# Patient Record
Sex: Male | Born: 1990 | Race: White | Hispanic: No | Marital: Married | Smoking: Never smoker
Health system: Southern US, Community
[De-identification: ages and names within clinical notes are randomized; demographics above are authoritative.]

## PROBLEM LIST (undated history)

## (undated) DIAGNOSIS — I429 Cardiomyopathy, unspecified: Secondary | ICD-10-CM

## (undated) HISTORY — PX: HERNIA REPAIR: SHX51

---

## 2020-10-22 ENCOUNTER — Other Ambulatory Visit: Payer: Self-pay

## 2020-10-22 ENCOUNTER — Emergency Department (HOSPITAL_BASED_OUTPATIENT_CLINIC_OR_DEPARTMENT_OTHER): Payer: Self-pay

## 2020-10-22 ENCOUNTER — Inpatient Hospital Stay (HOSPITAL_BASED_OUTPATIENT_CLINIC_OR_DEPARTMENT_OTHER)
Admission: EM | Admit: 2020-10-22 | Discharge: 2020-10-27 | DRG: 193 | Disposition: A | Discharge status: Home / Self Care | Payer: Self-pay | Attending: Student | Admitting: Student

## 2020-10-22 ENCOUNTER — Encounter (HOSPITAL_BASED_OUTPATIENT_CLINIC_OR_DEPARTMENT_OTHER): Payer: Self-pay | Admitting: Emergency Medicine

## 2020-10-22 DIAGNOSIS — I509 Heart failure, unspecified: Secondary | ICD-10-CM

## 2020-10-22 DIAGNOSIS — I428 Other cardiomyopathies: Secondary | ICD-10-CM | POA: Diagnosis present

## 2020-10-22 DIAGNOSIS — Z6841 Body Mass Index (BMI) 40.0 and over, adult: Secondary | ICD-10-CM

## 2020-10-22 DIAGNOSIS — R008 Other abnormalities of heart beat: Secondary | ICD-10-CM | POA: Diagnosis present

## 2020-10-22 DIAGNOSIS — J189 Pneumonia, unspecified organism: Principal | ICD-10-CM | POA: Diagnosis present

## 2020-10-22 DIAGNOSIS — A419 Sepsis, unspecified organism: Secondary | ICD-10-CM | POA: Diagnosis present

## 2020-10-22 DIAGNOSIS — D72829 Elevated white blood cell count, unspecified: Secondary | ICD-10-CM | POA: Diagnosis present

## 2020-10-22 DIAGNOSIS — I5041 Acute combined systolic (congestive) and diastolic (congestive) heart failure: Secondary | ICD-10-CM | POA: Diagnosis present

## 2020-10-22 DIAGNOSIS — J9690 Respiratory failure, unspecified, unspecified whether with hypoxia or hypercapnia: Secondary | ICD-10-CM

## 2020-10-22 DIAGNOSIS — I498 Other specified cardiac arrhythmias: Secondary | ICD-10-CM

## 2020-10-22 DIAGNOSIS — I081 Rheumatic disorders of both mitral and tricuspid valves: Secondary | ICD-10-CM | POA: Diagnosis present

## 2020-10-22 DIAGNOSIS — R0602 Shortness of breath: Secondary | ICD-10-CM | POA: Diagnosis present

## 2020-10-22 DIAGNOSIS — T500X5A Adverse effect of mineralocorticoids and their antagonists, initial encounter: Secondary | ICD-10-CM | POA: Diagnosis present

## 2020-10-22 DIAGNOSIS — Z79899 Other long term (current) drug therapy: Secondary | ICD-10-CM

## 2020-10-22 DIAGNOSIS — Z888 Allergy status to other drugs, medicaments and biological substances status: Secondary | ICD-10-CM

## 2020-10-22 DIAGNOSIS — Z20822 Contact with and (suspected) exposure to covid-19: Secondary | ICD-10-CM | POA: Diagnosis present

## 2020-10-22 DIAGNOSIS — Z8701 Personal history of pneumonia (recurrent): Secondary | ICD-10-CM

## 2020-10-22 DIAGNOSIS — R0683 Snoring: Secondary | ICD-10-CM | POA: Diagnosis present

## 2020-10-22 DIAGNOSIS — I5021 Acute systolic (congestive) heart failure: Secondary | ICD-10-CM | POA: Diagnosis present

## 2020-10-22 DIAGNOSIS — I5022 Chronic systolic (congestive) heart failure: Secondary | ICD-10-CM | POA: Diagnosis present

## 2020-10-22 DIAGNOSIS — R778 Other specified abnormalities of plasma proteins: Secondary | ICD-10-CM | POA: Diagnosis present

## 2020-10-22 DIAGNOSIS — Z8249 Family history of ischemic heart disease and other diseases of the circulatory system: Secondary | ICD-10-CM

## 2020-10-22 DIAGNOSIS — E876 Hypokalemia: Secondary | ICD-10-CM | POA: Diagnosis present

## 2020-10-22 DIAGNOSIS — Z87891 Personal history of nicotine dependence: Secondary | ICD-10-CM

## 2020-10-22 DIAGNOSIS — I110 Hypertensive heart disease with heart failure: Secondary | ICD-10-CM | POA: Diagnosis present

## 2020-10-22 DIAGNOSIS — I493 Ventricular premature depolarization: Secondary | ICD-10-CM | POA: Diagnosis present

## 2020-10-22 DIAGNOSIS — F419 Anxiety disorder, unspecified: Secondary | ICD-10-CM | POA: Diagnosis present

## 2020-10-22 DIAGNOSIS — E871 Hypo-osmolality and hyponatremia: Secondary | ICD-10-CM | POA: Diagnosis present

## 2020-10-22 HISTORY — DX: Morbid (severe) obesity due to excess calories: E66.01

## 2020-10-22 LAB — BASIC METABOLIC PANEL
Anion gap: 10 (ref 5–15)
BUN: 11 mg/dL (ref 6–20)
CO2: 20 mmol/L — ABNORMAL LOW (ref 22–32)
Calcium: 8 mg/dL — ABNORMAL LOW (ref 8.9–10.3)
Chloride: 105 mmol/L (ref 98–111)
Creatinine, Ser: 0.99 mg/dL (ref 0.61–1.24)
GFR, Estimated: >60 mL/min (ref >60)
Glucose, Bld: 96 mg/dL (ref 70–99)
Potassium: 4 mmol/L (ref 3.5–5.1)
Sodium: 135 mmol/L (ref 135–145)

## 2020-10-22 LAB — CBC WITH DIFFERENTIAL/PLATELET
Abs Immature Granulocytes: 0.06 10*3/uL (ref 0.00–0.07)
Basophils Absolute: 0 10*3/uL (ref 0.0–0.1)
Basophils Relative: 0 %
Eosinophils Absolute: 0.1 10*3/uL (ref 0.0–0.5)
Eosinophils Relative: 0 %
HCT: 42.2 % (ref 39.0–52.0)
Hemoglobin: 13.8 g/dL (ref 13.0–17.0)
Immature Granulocytes: 0 %
Lymphocytes Relative: 7 %
Lymphs Abs: 1 10*3/uL (ref 0.7–4.0)
MCH: 27.3 pg (ref 26.0–34.0)
MCHC: 32.7 g/dL (ref 30.0–36.0)
MCV: 83.4 fL (ref 80.0–100.0)
Monocytes Absolute: 1.1 10*3/uL — ABNORMAL HIGH (ref 0.1–1.0)
Monocytes Relative: 7 %
Neutro Abs: 12.8 10*3/uL — ABNORMAL HIGH (ref 1.7–7.7)
Neutrophils Relative %: 86 %
Platelets: 312 10*3/uL (ref 150–400)
RBC: 5.06 MIL/uL (ref 4.22–5.81)
RDW: 13.5 % (ref 11.5–15.5)
WBC: 15.1 10*3/uL — ABNORMAL HIGH (ref 4.0–10.5)
nRBC: 0 % (ref 0.0–0.2)

## 2020-10-22 LAB — TROPONIN I (HIGH SENSITIVITY)
Troponin I (High Sensitivity): 47 ng/L — ABNORMAL HIGH (ref <18)
Troponin I (High Sensitivity): 46 ng/L — ABNORMAL HIGH (ref <18)

## 2020-10-22 LAB — RESP PANEL BY RT-PCR (FLU A&B, COVID) ARPGX2
Influenza A by PCR: NEGATIVE
Influenza B by PCR: NEGATIVE
SARS Coronavirus 2 by RT PCR: NEGATIVE

## 2020-10-22 LAB — BRAIN NATRIURETIC PEPTIDE: B Natriuretic Peptide: 183.1 pg/mL — ABNORMAL HIGH (ref 0.0–100.0)

## 2020-10-22 LAB — D-DIMER, QUANTITATIVE: D-Dimer, Quant: 0.54 ug/mL-FEU — ABNORMAL HIGH (ref 0.00–0.50)

## 2020-10-22 MED ORDER — SODIUM CHLORIDE 0.9 % IV SOLN
2.0000 g | Freq: Once | INTRAVENOUS | Status: AC
  Administered 2020-10-23: 2 g via INTRAVENOUS
  Filled 2020-10-22: qty 2

## 2020-10-22 MED ORDER — VANCOMYCIN HCL 1750 MG/350ML IV SOLN
1750.0000 mg | Freq: Three times a day (TID) | INTRAVENOUS | Status: DC
  Administered 2020-10-23: 1750 mg via INTRAVENOUS
  Filled 2020-10-22 (×2): qty 350

## 2020-10-22 MED ORDER — ACETAMINOPHEN 650 MG RE SUPP: 650.0000 mg | Freq: Four times a day (QID) | RECTAL | Status: DC | PRN

## 2020-10-22 MED ORDER — ENOXAPARIN SODIUM 40 MG/0.4ML IJ SOSY
40.0000 mg | PREFILLED_SYRINGE | INTRAMUSCULAR | Status: DC
  Administered 2020-10-23: 40 mg via SUBCUTANEOUS
  Filled 2020-10-22: qty 0.4

## 2020-10-22 MED ORDER — DOXYCYCLINE HYCLATE 100 MG PO TABS
100.0000 mg | ORAL_TABLET | Freq: Once | ORAL | Status: AC
  Administered 2020-10-22: 100 mg via ORAL
  Filled 2020-10-22: qty 1

## 2020-10-22 MED ORDER — IOHEXOL 350 MG/ML SOLN
100.0000 mL | Freq: Once | INTRAVENOUS | Status: AC | PRN
  Administered 2020-10-22: 100 mL via INTRAVENOUS

## 2020-10-22 MED ORDER — ACETAMINOPHEN 325 MG PO TABS: 650.0000 mg | ORAL_TABLET | Freq: Four times a day (QID) | ORAL | Status: DC | PRN

## 2020-10-22 MED ORDER — SODIUM CHLORIDE 0.9 % IV SOLN
2.0000 g | Freq: Three times a day (TID) | INTRAVENOUS | Status: DC
  Administered 2020-10-23 – 2020-10-24 (×4): 2 g via INTRAVENOUS
  Filled 2020-10-22 (×4): qty 2

## 2020-10-22 MED ORDER — VANCOMYCIN HCL 2000 MG/400ML IV SOLN
2000.0000 mg | Freq: Once | INTRAVENOUS | Status: AC
  Administered 2020-10-23: 2000 mg via INTRAVENOUS
  Filled 2020-10-22: qty 400

## 2020-10-22 MED ORDER — SODIUM CHLORIDE 0.9 % IV SOLN
2.0000 g | Freq: Once | INTRAVENOUS | Status: AC
  Administered 2020-10-22: 2 g via INTRAVENOUS
  Filled 2020-10-22: qty 20

## 2020-10-22 NOTE — H&P (Addendum)
History and Physical    PLEASE NOTE THAT DRAGON DICTATION SOFTWARE WAS USED IN THE CONSTRUCTION OF THIS NOTE.   David Vang DPO:[id number] DOB: 12-02-1990 DOA: 10/22/2020  PCP: Pcp, No Patient coming from: home   I have personally briefly reviewed patient's old medical records in [place]  Chief Complaint: Shortness of breath  HPI: David Vang is a 30 y.o. male with medical history significant for morbid obesity, recurrent pneumonia, who is admitted to [facility] on 10/22/2020 by way of transfer from [place] with suspected sepsis due to bilateral pneumonia after presenting from home to the latter facility complaining of shortness of breath.  The patient reports approximately 2 weeks of shortness of breath associated with new onset productive cough as well as subjective fever in the absence of chills, full body rigors, or generalized myalgias.  He denies any associated peripheral edema, and reports a stable weight over the last month, specifically noting no recent weight gain after weighing himself every several days over the course of that timeframe.  Denies any associated calf tenderness, lower extremity erythema, hemoptysis.  Reports mild orthopnea, and increased cough when attempting to lay supine.  Denies any PND.  Not associate with any chest pain, diaphoresis, nausea, vomiting, presyncope, or syncope.  He reports mild intermittent wheezing over the last few weeks, but denies any associated or preceding rhinitis, rhinorrhea, or periorbital swelling.  No known history of asthma.  Denies any recent trauma or travel, including no travel into the [place] portion [place] the [place].  Overall, denies any known history of underlying chronic pulmonary pathology, and reports that he is a lifelong non-smoker.  He reports that he has been diagnosed with pneumonia on 4 separate occasions leading up to this current presentation.  The first 2 diagnoses of pneumonia were  when he was in elementary school, with the third episode reported to be in high school, and most recent prior pneumonia diagnosis is reported to have occurred 5 to 6 years ago.  Not on any chronic immunosuppression.  No recent neck stiffness, sore throat, diarrhea, or rash.  No recent known COVID-19 exposures.  He also denies any recent dysuria, gross hematuria, or change in urinary urgency/frequency.  Denies routine or recent alcohol consumption or recreational drug use, including no use of stimulants, namely cocaine or methamphetamine.  Following onset of the above symptoms, he was diagnosed with community-acquired pneumonia as an outpatient approximately 2 weeks ago, at which time you completed a 5-day course of azithromycin as well as a 6-day course of prednisone following which he reported no significant improvement in his respiratory status.  Subsequent to that, he was provided with an albuterol inhaler, which also offered no benefit in his shortness of breath.  Rather, the patient reported subsequent development of palpitations following every 4 to 5 times per day use of this albuterol inhaler.  In the setting of persistence of the above symptoms, he was prescribed a 5-day course of Levaquin, and is now completed for 5 days of this antibiotic.  However, in the setting of progressive worsening of his shortness of breath in spite of good compliance with the above interventions, he elected to present to [place] emergency department earlier today for further evaluation and management of the above.  He denies any known history of prior ventricular bigeminy.  He conveys a family history that is notable for his father being diagnosed with cardiomyopathy in his late 30s/early 66s ultimately requiring an LVAD.  Ongoing follow-up  otherwise, he denies any known injury, cardiac pathology within his family.      [facility] ED Course:  Vital signs in the ED were notable for the following: Normal:  Tetramex 99.6, heart rate 98-1 20; blood pressure 108/73-124/89; respiratory rate 24-30; oxygen saturation 95 to 96% on room air.  Labs were notable for the following: BMP was notable for sodium 135, potassium 4.0, bicarbonate 20, anion gap 10, creatinine 0.99.  CBC notable for white blood cell count of 15,000, hemoglobin 13.8.  D-dimer 0.54.  BNP 183, without prior data point available for point comparison.  Initial high-sensitivity troponin I found to be 47, with repeat trending down to 46.  Urinalysis showed no evidence of white blood cells no bacteria, was nitrate negative and leukocyte Estrace negative.  Nasopharyngeal COVID-19/influenza PCR were performed at [place] today, and found to be negative.  EKG showed ventricular bigeminy with all narrow complex QRS, ventricular rate 144, without overt T wave changes or ST changes.  Chest x-ray showed mild cardiomegaly with mild vascular congestion, as well as bilateral mid to lower lung field airspace opacities, right greater than left, suggestive of pneumonia versus edema, in the absence of any evidence of pleural effusion or pneumothorax.  CTA chest showed no acute pulmonary embolism, but showed bilateral airspace opacities, right greater than left suggestive of multifocal pneumonia versus asymmetric edema.  While in the ED, the following were administered: Rocephin 2 g IV x1, doxycycline 100 mg p.o. x1.     Review of Systems: As per HPI otherwise 10 point review of systems negative.   Past Medical History:  Diagnosis Date  . Morbid obesity ([place])     History reviewed. No pertinent surgical history.  Social History:  reports that he has never smoked. He has never used smokeless tobacco. No history on file for alcohol use and drug use.   Allergies  Allergen Reactions  . Benadryl [Diphenhydramine] Other (See Comments)    Pass out    Family History  Problem Relation Age of Onset  . Heart failure Father        dx'ed with  cardiomyoopathy in his late 62's/40's, and ultimately required LVAD     Prior to Admission medications   Not on File  (denies any scheduled or prn prescription or over-the-counter medications on an outpatient basis).    Objective    Physical Exam: Vitals:   10/22/20 1701 10/22/20 1800 10/22/20 1912 10/22/20 2211  BP: 108/73 123/90 124/89 110/85  Pulse: 64 67 65 63  Resp: (!) 30 18 (!) 30 (!) 24  Temp:   98.3 F (36.8 C) 99.3 F (37.4 C)  TempSrc:   Oral Oral  SpO2: 95% 95% 96% 96%  Weight:      Height:        General: appears to be stated age; alert, oriented; mildly increased wob noted. Skin: warm, dry, no rash Head:  AT/[place] Mouth:  Oral mucosa membranes appear moist, normal dentition Neck: supple; trachea midline Heart:  RRR; did not appreciate any M/R/G Lungs: CTAB, did not appreciate any wheezes, rales, or rhonchi Abdomen: + BS; soft, ND, NT Vascular: 2+ pedal pulses b/l; 2+ radial pulses b/l Extremities: no peripheral edema, no muscle wasting Neuro: strength and sensation intact in upper and lower extremities b/l     Labs on Admission: I have personally reviewed following labs and imaging studies  CBC: Recent Labs  Lab 10/22/20 1625  WBC 15.1*  NEUTROABS 12.8*  HGB 13.8  HCT 42.2  MCV 83.4  PLT 675   Basic Metabolic Panel: Recent Labs  Lab 10/22/20 1625  NA 135  K 4.0  CL 105  CO2 20*  GLUCOSE 96  BUN 11  CREATININE 0.99  CALCIUM 8.0*   GFR: Estimated Creatinine Clearance: 138.1 mL/min (by C-G formula based on SCr of 0.99 mg/dL). Liver Function Tests: No results for input(s): AST, ALT, ALKPHOS, BILITOT, PROT, ALBUMIN in the last 168 hours. No results for input(s): LIPASE, AMYLASE in the last 168 hours. No results for input(s): AMMONIA in the last 168 hours. Coagulation Profile: No results for input(s): INR, PROTIME in the last 168 hours. Cardiac Enzymes: No results for input(s): CKTOTAL, CKMB, CKMBINDEX, TROPONINI in the last 168  hours. BNP (last 3 results) No results for input(s): PROBNP in the last 8760 hours. HbA1C: No results for input(s): HGBA1C in the last 72 hours. CBG: No results for input(s): GLUCAP in the last 168 hours. Lipid Profile: No results for input(s): CHOL, HDL, LDLCALC, TRIG, CHOLHDL, LDLDIRECT in the last 72 hours. Thyroid Function Tests: No results for input(s): TSH, T4TOTAL, FREET4, T3FREE, THYROIDAB in the last 72 hours. Anemia Panel: No results for input(s): VITAMINB12, FOLATE, FERRITIN, TIBC, IRON, RETICCTPCT in the last 72 hours. Urine analysis: No results found for: COLORURINE, APPEARANCEUR, LABSPEC, PHURINE, GLUCOSEU, HGBUR, BILIRUBINUR, KETONESUR, PROTEINUR, UROBILINOGEN, NITRITE, LEUKOCYTESUR  Radiological Exams on Admission: CT Angio Chest PE W and/or Wo Contrast  Result Date: 10/22/2020 CLINICAL DATA:  Worsening shortness of breath over the last 2 weeks. Diagnosed with pneumonia 2 weeks ago. Patient has been on antibiotics without relief. EXAM: CT ANGIOGRAPHY CHEST WITH CONTRAST TECHNIQUE: Multidetector CT imaging of the chest was performed using the standard protocol during bolus administration of intravenous contrast. Multiplanar CT image reconstructions and MIPs were obtained to evaluate the vascular anatomy. CONTRAST:  196m OMNIPAQUE IOHEXOL 350 MG/ML SOLN COMPARISON:  Current chest radiograph. FINDINGS: Cardiovascular: Pulmonary arteries are well opacified. There is no evidence of a pulmonary embolism. Heart is borderline to mildly enlarged. No pericardial effusion. Great vessels are normal in caliber. Mediastinum/Nodes: Visualized thyroid is unremarkable. There are shotty prominent to mildly enlarged mediastinal and right hilar lymph nodes. A superior right paratracheal lymph node measures 1.4 cm in short axis. A probable conglomeration of right paratracheal lymph nodes just above the azygos arch measured 2 cm in short axis. An enlarged prevascular node measures 1.1 cm short axis. A  right superior hilar node measures 1.6 cm in short axis. Trachea and esophagus are unremarkable. Lungs/Pleura: Trace right pleural effusion. Bilateral airspace opacities which predominate on the right, noted in all lobes, with similar appearing consolidation in the left lower lobe, sparing the left upper lobe. No interstitial thickening. No pneumothorax. Upper Abdomen: Decreased attenuation of the liver consistent with fatty infiltration. Otherwise unremarkable. Musculoskeletal: No chest wall abnormality. No acute or significant osseous findings. Review of the MIP images confirms the above findings. IMPRESSION: 1. No evidence of a pulmonary embolism. 2. Extensive airspace lung opacities, more evident on the right, as detailed above. Patient has borderline cardiomegaly and a trace right pleural effusion. Findings may reflect asymmetric pulmonary edema or multifocal pneumonia. Electronically Signed   By: DLajean ManesM.D.   On: 10/22/2020 17:48   DG Chest Portable 1 View  Result Date: 10/22/2020 CLINICAL DATA:  30year old male with shortness of breath. Evaluate for pneumonia. EXAM: PORTABLE CHEST 1 VIEW COMPARISON:  None. FINDINGS: There is mild cardiomegaly with mild vascular congestion. Bilateral mid to lower lung field densities, right greater  than left may represent edema or pneumonia. No large pleural effusion. No pneumothorax. No acute osseous pathology. IMPRESSION: 1. Cardiomegaly with mild vascular congestion. 2. Bilateral mid to lower lung field densities, right greater than left may represent edema or pneumonia. Electronically Signed   By: Anner Crete M.D.   On: 10/22/2020 16:07     EKG: Independently reviewed, with result as described above.    Assessment/Plan   David Vang is a 30 y.o. male with medical history significant for morbid obesity, recurrent pneumonia, who is admitted to [facility] on 10/22/2020 by way of transfer from [place] with suspected sepsis  due to bilateral pneumonia after presenting from home to the latter facility complaining of shortness of breath.   Principal Problem:   CAP (community acquired pneumonia) Active Problems:   Sepsis ([place])   Leukocytosis   SOB (shortness of breath)   Ventricular bigeminy   Elevated troponin    #) Sepsis due to bilateral pneumonia: All specific etiology leading to the patient's presenting shortness of breath is likely multifactorial in its etiology, there is a suspected contribution from bilateral pneumonia given the patient's report of 2 weeks of progressive shortness of breath associated with subjective fever, new onset productive cough, and radiographic evidence of bilateral airspace opacities, right greater than left, suggestive of multifocal pneumonia versus asymmetrical edema.  SIRS criteria met via presenting leukocytosis and tachycardia.  Patient sepsis does not meet criteria to be considered severe nature in the absence of associated evidence of endorgan damage, although lactate was not checked at [facility] prior to transfer.  No evidence of associated hypotension.  At this time, there are no criteria for initiation of a 30 mL/kg IVF bolus, particularly given that there may be a component of atypical heart failure, as further discussed below.  As the above symptoms progressed in spite of compliance with outpatient regimens of both azithromycin and Levaquin, will expand current antibiotic coverage such that we will start IV vancomycin for MRSA coverage as well as cefepime for inclusion of pseudomonal coverage.  We will refrain from including atypical pneumonia coverage at this time, as he has not received 2 rounds of antibiotics that should have provided adequate atypical coverage in his completed courses of azithromycin and Levaquin.  No clinical suspicion for aspiration at this time.  We will also check MRSA PCR, which if negative, anticipate being able to discontinue IV vancomycin given the  high negative predictive value of such finding for MRSA pneumonia.  Of note, COVID-19/influenza PCR performed earlier today were found to be negative.   While there is clinical and radiographic suggestion of bilateral pneumonia, differential for the patient's presentation as well as radiographic appearance includes asymmetrical edema secondary to congestive heart failure in the setting of no prior diagnosis of such.  Of note, patient noted to be in ventricular bigeminy, which would increase risk for Cardiomyopathy as a consequence of high PVC burden.  We will further evaluate ventricular bigeminy as a potential cause versus a sequela I of underlying heart failure, as further described below, including via pursuit of echocardiogram in the morning.  Patient's family history of cardiomyopathy diagnosed in his father in his late 37s, progressing to ultimately requiring LVAD is noted, and warrants further evaluation via echocardiogram given this history as well as the nature of the patient's presentation.  Additionally, in attempting to further discern between bilateral pneumonia versus heart failure with asymmetrical edema, I have ordered Lasix 20 mg IV x1 and will assess for ensuing  urine output as well as symptomatic response to this measure.   Plan: Check blood cultures x2 now.  Check lactic acid level.  IV vancomycin and cefepime, as above.  Check MRSA PCR.  Sputum culture.  Repeat CBC with differential in the morning.  Check mycoplasma antibodies as well as strep pneumonia urine antigen.  Procalcitonin.  Further evaluation management of ventricular bigeminy, as detailed below.  Echocardiogram.  Check serum magnesium level as well as serum phos.  Monitor strict I's and O's and daily weights.  Lasix 20 mg IV x1, as above.      #) Ventricular bigeminy: In the absence of any prior documentation of such, and with the patient denying any known history of this, he is found to be in ventricular bigeminy while at  [facility] ED earlier today and associated with ventricular rate in the 120s to 130s.  Overall, duration of this is unclear in terms of its chronicity.  In the setting of the patient's body habitus with morbid obesity, he is certainly at risk for development of LVH, increasing his risk for ventricular bigeminy.  We will further assess for structural cardiac abnormalities that may be contributory to this presentation via echocardiogram has been ordered for the morning.  Exacerbation of underlying ventricular bigeminy may have also been contributed by multiple recent pharmacologic factors, including recent use of albuterol inhaler, up to 4-5 times per day over the last week in addition to steroid use. Overall, at this time, it is unclear if patient's ventricular bigeminy is as a consequence of his recent acute illness, versus a contributing factor leading to new onset heart failure.  Given the unclear primary pulm pathology, including the possibility of underlying acutely decompensated heart failure, will refrain from beta-blocker initiation for now, pending echocardiogram in the morning.  We will also attempt to optimize electrolyte management in terms of serum potassium and magnesium levels, with goal to maintain these values of greater than or equal to 4.0 and 2.0, respectively.  Patient's presenting serum potassium found to be within this goal range, while serum magnesium of 1.8 will warrant IV magnesium supplementation, as further detailed below.  will also check TSH.   Plan: Monitor on telemetry.  Monitor continuous pulse oximetry.  Echocardiogram in the morning, as above.  Refrain from beta-1 agonist use, as above.  Magnesium sulfate 2 g IV over 2 hours x 1 dose now.  Recheck serum magnesium level in the morning.  Repeat BMP in the morning, with close attention to interval trend in serum potassium level.  TSH. UDS.      #) Elevated troponin: mildly elevated initial troponin of 47, with repeat value  trending down to 46. No prior high sensitivity troponin I value available for point of comparison.  Suspect that this mildly elevated troponin is on the basis of supply demand mismatch in the setting of ventricular bigeminy with high PVC volume and associated relative decline in diastolic filling time vs supply demand mismatch in the setting of sepsis due to bilateral pneumonia, as opposed to representing a type I process due to acute plaque rupture.  As described above, EKG shows evidence of ventricular bigeminy without overt evidence of T wave or ST changes.  Additionally, presentation is not been associate with any chest pain.  Overall, ACS is felt to be less likely relative to type 2 supply demand mismatch, as above, will closely monitor on telemetry while further evaluating presenting with ventricular bigeminy, including pursuit of echocardiogram, as further described above.  Of  note, CTA performed earlier today demonstrated no evidence of acute pulmonary embolism.    Plan:Monitor on telemetry.  Repeat BMP and serum magnesium levels in the morning.  Repeat CBC in the morning as well.  Further evaluation and management of presenting ventricular bigeminy, as above, including echocardiogram.       #) Morbid obesity: Presenting BMI noted to be 42, with the patient denying any weight gain over the last 1 to 2 months.  This body habitus is a predisposing factor for LVH associated ventricular bigeminy.   Plan: Check TSH.  Monitor strict I's and O's and daily weights.     DVT prophylaxis: Lovenox 40 mg subcu daily Code Status: Full code Family Communication: none Disposition Plan: Per Rounding Team Consults called: none  Admission status: Inpatient; med telemetry.     Of note, this patient was added by me to the following Admit List/Treatment Team: [email or web address].      PLEASE NOTE THAT DRAGON DICTATION SOFTWARE WAS USED IN THE CONSTRUCTION OF THIS NOTE.   [place] Pager [phone] From [place]  Otherwise, please contact night-coverage  [email or web address] Password [facility]   10/22/2020, 10:37 PM

## 2020-10-22 NOTE — ED Triage Notes (Signed)
Pt arrives to ED with c/o of worsening shortness of breath over the past two weeks. Pt states he was diagnosed with bilateral pneumonia x2 weeks ago at his PCP office and has been on two rounds of antibiotics without relief of symptoms. Pt has finished his azithromycin, prednisone, and levofloxacin with worsening of his symptoms. Albuterol inhaler has given little relief.

## 2020-10-22 NOTE — ED Notes (Signed)
Report given to [facility] at this time.   

## 2020-10-22 NOTE — Progress Notes (Signed)
Pharmacy Antibiotic Note  David Vang is a 30 y.o. male admitted on 10/22/2020 with pneumonia.  Pharmacy has been consulted for vancomycin and cefepime dosing.  Plan: Cefepime 2gm IV q8 hours Vancomycin 2gm IV x 1 then 1750 mg IV q8 F/u renal function, cultures and clinical course  Height: 5\' 7"  (170.2 cm) Weight: 122.5 kg (270 lb) IBW/kg (Calculated) : 66.1  Temp (24hrs), Avg:99.1 F (37.3 C), Min:98.3 F (36.8 C), Max:99.6 F (37.6 C)  Recent Labs  Lab 10/22/20 1625  WBC 15.1*  CREATININE 0.99    Estimated Creatinine Clearance: 138.1 mL/min (by C-G formula based on SCr of 0.99 mg/dL).    Allergies  Allergen Reactions  . Benadryl [Diphenhydramine] Other (See Comments)    Pass out    Thank you for allowing pharmacy to be a part of this patient's care.  10/24/20 [place] 10/22/2020 11:01 PM

## 2020-10-22 NOTE — ED Provider Notes (Addendum)
[facility] EMERGENCY DEPT Provider Note   CSN: [id number] Arrival date & time: 10/22/20  1527     History Chief Complaint  Patient presents with  . Shortness of Breath    David Vang is a 30 y.o. male.  Presenting to ER with concern for shortness of breath.  Patient reports that for the past 3 weeks he has been dealing with cough and shortness of breath.  Initially primarily coughing, with more mild shortness of breath.  Had gone to an urgent care and was diagnosed with pneumonia.  He completed a course of Z-Pak, may have some initial improvement but not complete resolution.  Went back to urgent care and was given a prescription for levofloxacin.  States that he has completed this but still is not improved.  Over the past week or so has had significant increase in his difficulty breathing.  No associated chest pain, no blood in cough.  Some mucus mostly clear.  Also states that he was prescribed an inhaler and this may have had some mild temporary relief but no long-term improvement.  Does not have any known cardiac history but his father had some major heart problems though he is unsure what exactly the problems were.  HPI     History reviewed. No pertinent past medical history.  Patient Active Problem List   Diagnosis Date Noted  . CAP (community acquired pneumonia) 10/22/2020    History reviewed. No pertinent family history.  Social History   Tobacco Use  . Smoking status: Never Smoker  . Smokeless tobacco: Never Used    Home Medications Prior to Admission medications   Not on File    Allergies    Benadryl [diphenhydramine]  Review of Systems   Review of Systems  Constitutional: Positive for chills and fatigue. Negative for fever.  HENT: Negative for ear pain and sore throat.   Eyes: Negative for pain and visual disturbance.  Respiratory: Positive for cough and shortness of breath.   Cardiovascular: Negative for chest pain and palpitations.   Gastrointestinal: Negative for abdominal pain and vomiting.  Genitourinary: Negative for dysuria and hematuria.  Musculoskeletal: Negative for arthralgias and back pain.  Skin: Negative for color change and rash.  Neurological: Negative for seizures and syncope.  All other systems reviewed and are negative.   Physical Exam Updated Vital Signs BP 124/89 (BP Location: Right Arm)   Pulse 65   Temp 98.3 F (36.8 C) (Oral)   Resp (!) 30   Ht 5\' 7"  (1.702 m)   Wt 122.5 kg   SpO2 96%   BMI 42.29 kg/m   Physical Exam Vitals and nursing note reviewed.  Constitutional:      Appearance: He is well-developed.     Comments: Appears well but is visibly tachypneic,  HENT:     Head: Normocephalic and atraumatic.  Eyes:     Conjunctiva/sclera: Conjunctivae normal.  Cardiovascular:     Rate and Rhythm: Normal rate and regular rhythm.     Heart sounds: No murmur heard.   Pulmonary:     Comments: Moderately tachypneic, speaking in short phrases, lungs are mostly clear, somewhat diminished at bases Abdominal:     Palpations: Abdomen is soft.     Tenderness: There is no abdominal tenderness.  Musculoskeletal:     Cervical back: Neck supple.  Skin:    General: Skin is warm and dry.  Neurological:     General: No focal deficit present.     Mental Status: He is alert.  ED Results / Procedures / Treatments   Labs (all labs ordered are listed, but only abnormal results are displayed) Labs Reviewed  CBC WITH DIFFERENTIAL/PLATELET - Abnormal; Notable for the following components:      Result Value   WBC 15.1 (*)    Neutro Abs 12.8 (*)    Monocytes Absolute 1.1 (*)    All other components within normal limits  BASIC METABOLIC PANEL - Abnormal; Notable for the following components:   CO2 20 (*)    Calcium 8.0 (*)    All other components within normal limits  D-DIMER, QUANTITATIVE - Abnormal; Notable for the following components:   D-Dimer, Quant 0.54 (*)    All other  components within normal limits  BRAIN NATRIURETIC PEPTIDE - Abnormal; Notable for the following components:   B Natriuretic Peptide 183.1 (*)    All other components within normal limits  TROPONIN I (HIGH SENSITIVITY) - Abnormal; Notable for the following components:   Troponin I (High Sensitivity) 47 (*)    All other components within normal limits  TROPONIN I (HIGH SENSITIVITY) - Abnormal; Notable for the following components:   Troponin I (High Sensitivity) 46 (*)    All other components within normal limits  RESP PANEL BY RT-PCR (FLU A&B, COVID) [id number]    EKG EKG Interpretation  Date/Time:  Sunday Oct 22 2020 15:39:12 EDT Ventricular Rate:  141 PR Interval:  137 QRS Duration: 104 QT Interval:  299 QTC Calculation: 430 R Axis:   -24 Text Interpretation: Sinus tachycardia Ventricular bigeminy Borderline left axis deviation Anteroseptal infarct, old Borderline ST depression, diffuse leads Confirmed by Marianna Fuss ([id number]) on 10/22/2020 3:45:43 PM   Radiology CT Angio Chest PE W and/or Wo Contrast  Result Date: 10/22/2020 CLINICAL DATA:  Worsening shortness of breath over the last 2 weeks. Diagnosed with pneumonia 2 weeks ago. Patient has been on antibiotics without relief. EXAM: CT ANGIOGRAPHY CHEST WITH CONTRAST TECHNIQUE: Multidetector CT imaging of the chest was performed using the standard protocol during bolus administration of intravenous contrast. Multiplanar CT image reconstructions and MIPs were obtained to evaluate the vascular anatomy. CONTRAST:  OMNIPAQUE IOHEXOL 350 MG/ML SOLN COMPARISON:  Current chest radiograph. FINDINGS: Cardiovascular: Pulmonary arteries are well opacified. There is no evidence of a pulmonary embolism. Heart is borderline to mildly enlarged. No pericardial effusion. Great vessels are normal in caliber. Mediastinum/Nodes: Visualized thyroid is unremarkable. There are shotty prominent to mildly enlarged mediastinal and right hilar lymph nodes.  A superior right paratracheal lymph node measures 1.4 cm in short axis. A probable conglomeration of right paratracheal lymph nodes just above the azygos arch measured 2 cm in short axis. An enlarged prevascular node measures 1.1 cm short axis. A right superior hilar node measures 1.6 cm in short axis. Trachea and esophagus are unremarkable. Lungs/Pleura: Trace right pleural effusion. Bilateral airspace opacities which predominate on the right, noted in all lobes, with similar appearing consolidation in the left lower lobe, sparing the left upper lobe. No interstitial thickening. No pneumothorax. Upper Abdomen: Decreased attenuation of the liver consistent with fatty infiltration. Otherwise unremarkable. Musculoskeletal: No chest wall abnormality. No acute or significant osseous findings. Review of the MIP images confirms the above findings. IMPRESSION: 1. No evidence of a pulmonary embolism. 2. Extensive airspace lung opacities, more evident on the right, as detailed above. Patient has borderline cardiomegaly and a trace right pleural effusion. Findings may reflect asymmetric pulmonary edema or multifocal pneumonia. Electronically Signed   By: Renard Hamper.D.  On: 10/22/2020 17:48   DG Chest Portable 1 View  Result Date: 10/22/2020 CLINICAL DATA:  30 year old male with shortness of breath. Evaluate for pneumonia. EXAM: PORTABLE CHEST 1 VIEW COMPARISON:  None. FINDINGS: There is mild cardiomegaly with mild vascular congestion. Bilateral mid to lower lung field densities, right greater than left may represent edema or pneumonia. No large pleural effusion. No pneumothorax. No acute osseous pathology. IMPRESSION: 1. Cardiomegaly with mild vascular congestion. 2. Bilateral mid to lower lung field densities, right greater than left may represent edema or pneumonia. Electronically Signed   By: Elgie Collard M.D.   On: 10/22/2020 16:07    Procedures .Critical Care Performed by: Milagros Loll,  MD Authorized by: Milagros Loll, MD   Critical care provider statement:    Critical care time (minutes):  35   Critical care was necessary to treat or prevent imminent or life-threatening deterioration of the following conditions:  Respiratory failure   Critical care was time spent personally by me on the following activities:  Discussions with consultants, evaluation of patient's response to treatment, examination of patient, ordering and performing treatments and interventions, ordering and review of laboratory studies, ordering and review of radiographic studies, pulse oximetry, re-evaluation of patient's condition, obtaining history from patient or surrogate and review of old charts     Medications Ordered in ED Medications  cefTRIAXone (ROCEPHIN) 2 g in sodium chloride 0.9 % 100 mL IVPB (0 g Intravenous Stopped 10/22/20 1803)  doxycycline (VIBRA-TABS) tablet 100 mg (100 mg Oral Given 10/22/20 1721)  iohexol (OMNIPAQUE) 350 MG/ML injection 100 mL (100 mLs Intravenous Contrast Given 10/22/20 1709)    ED Course  I have reviewed the triage vital signs and the nursing notes.  Pertinent labs & imaging results that were available during my care of the patient were reviewed by me and considered in my medical decision making (see chart for details).    MDM Rules/Calculators/A&P                         30 year old male presenting to ER with concern for worsening shortness of breath.  Diagnosed in the outpatient setting with pneumonia and has completed course of azithromycin and levofloxacin without relief.  Chest x-ray concerning for pneumonia versus fluid overload.  Labs notable for leukocytosis, mild elevation in troponin and BNP as well as elevation in D-dimer.  EKG with bigeminy but no STEMI, given serial troponin flat, lower suspicion for ACS.  CTA negative for PE but again redemonstrated findings concerning for either pneumonia or fluid overload.  Due to cough and leukocytosis, suspect  most likely pneumonia.  Started IV antibiotics.  Given failed outpatient therapy, possibility of new onset heart failure, believe patient would benefit from IV antibiotics, echocardiogram and further observation in hospital.  [facility] hospitalist for admit.  Final Clinical Impression(s) / ED Diagnoses Final diagnoses:  Community acquired pneumonia, unspecified laterality  Heart failure, unspecified HF chronicity, unspecified heart failure type (HCC)  Respiratory failure, unspecified chronicity, unspecified whether with hypoxia or hypercapnia [facility])    Rx / DC Orders ED Discharge Orders    None       Milagros Loll, MD 10/22/20 2009    Milagros Loll, MD 10/22/20 2009

## 2020-10-22 NOTE — ED Notes (Signed)
Care Handoff/Patient Report given to Minerva Areola, [place]. All questions answered. Patient signed transfer consent form willingly.

## 2020-10-22 NOTE — Progress Notes (Signed)
[name], David Vang DOB 13-Oct-1990 MRN: [id number]  I discussed the following case with Dr. Stevie Kern (emergency department physician at [facility]), who contacted the call-center requesting transfer of the above patient to [facility] for further work-up and management of sepsis due to bilateral pneumonia.   David Vang is a 30 year old male with obesity but otherwise no reported medical history who [facility] dropp[facility] ED on 10/22/2020 complaining of 2 to 3 weeks of progressive shortness of breath associated with new onset nonproductive cough and subjective fever reportedly denies any associated orthopnea, PND, new onset peripheral edema, or recent weight gain.  Also reportedly denies any associated chest pain, palpitations, diaphoresis, nausea/vomiting. No hemoptysis, calf tenderness, or lower extremity erythema.  He reportedly was diagnosed with community-acquired pneumonia as an outpatient in the setting of the above symptoms, and is completed several courses of azithromycin and Levaquin over the last 2 weeks, without any ensuing improvement in the above symptoms.  No known history of underlying heart failure or chronic underlying pulmonary pathology.  Vital signs in the ED were notable for the following: Temperature 99.6, found to be in bigeminy, with heart rates in the low 100s, normotensive blood pressures, respiratory rate 25-30, O2 sats 95 to 96% on room air.   Labs were reportedly notable for: wbc of 15.  COVID-negative.  BNP 183, with no prior data point available for comparison.  Initial troponin I 47, with repeat trending down to 46. CTA chest which reportedly showed no evidence of acute pulmonary embolism, but demonstrated bilateral, asymmetric airspace opacities, reportedly favoring pneumonia however edema.  EKG reportedly showed bigeminy with heart rate in the low 100s and no evidence of T wave/ST changes.  Medications administered prior to transfer  included the following: Rocephin 2 g IV x1 and doxycycline 100 mg p.o. x1.  In the setting of progressive symptoms in spite of compliance with outpatient antibiotics, the patient has been accepted for transfer to a med-tele bed at [facility] for further work-up and management of suspected sepsis due to community-acquired pneumonia versus acute heart failure.   Newton Pigg, DO Hospitalist

## 2020-10-23 ENCOUNTER — Inpatient Hospital Stay (HOSPITAL_COMMUNITY): Payer: Self-pay

## 2020-10-23 ENCOUNTER — Encounter (HOSPITAL_COMMUNITY): Payer: Self-pay | Admitting: Internal Medicine

## 2020-10-23 DIAGNOSIS — R778 Other specified abnormalities of plasma proteins: Secondary | ICD-10-CM | POA: Diagnosis present

## 2020-10-23 DIAGNOSIS — R0602 Shortness of breath: Secondary | ICD-10-CM | POA: Diagnosis present

## 2020-10-23 DIAGNOSIS — I498 Other specified cardiac arrhythmias: Secondary | ICD-10-CM | POA: Diagnosis present

## 2020-10-23 DIAGNOSIS — A419 Sepsis, unspecified organism: Secondary | ICD-10-CM | POA: Diagnosis present

## 2020-10-23 DIAGNOSIS — I5031 Acute diastolic (congestive) heart failure: Secondary | ICD-10-CM

## 2020-10-23 DIAGNOSIS — D72829 Elevated white blood cell count, unspecified: Secondary | ICD-10-CM | POA: Diagnosis present

## 2020-10-23 DIAGNOSIS — R7989 Other specified abnormal findings of blood chemistry: Secondary | ICD-10-CM | POA: Diagnosis present

## 2020-10-23 LAB — COMPREHENSIVE METABOLIC PANEL
ALT: 55 U/L — ABNORMAL HIGH (ref 0–44)
AST: 30 U/L (ref 15–41)
Albumin: 2.9 g/dL — ABNORMAL LOW (ref 3.5–5.0)
Alkaline Phosphatase: 53 U/L (ref 38–126)
Anion gap: 9 (ref 5–15)
BUN: 10 mg/dL (ref 6–20)
CO2: 25 mmol/L (ref 22–32)
Calcium: 8.6 mg/dL — ABNORMAL LOW (ref 8.9–10.3)
Chloride: 102 mmol/L (ref 98–111)
Creatinine, Ser: 1.04 mg/dL (ref 0.61–1.24)
GFR, Estimated: >60 mL/min (ref >60)
Glucose, Bld: 123 mg/dL — ABNORMAL HIGH (ref 70–99)
Potassium: 3.9 mmol/L (ref 3.5–5.1)
Sodium: 136 mmol/L (ref 135–145)
Total Bilirubin: 1.2 mg/dL (ref 0.3–1.2)
Total Protein: 5.9 g/dL — ABNORMAL LOW (ref 6.5–8.1)

## 2020-10-23 LAB — URINALYSIS, COMPLETE (UACMP) WITH MICROSCOPIC
Bacteria, UA: NONE SEEN
Bilirubin Urine: NEGATIVE
Glucose, UA: NEGATIVE mg/dL
Hgb urine dipstick: NEGATIVE
Ketones, ur: 5 mg/dL — AB
Leukocytes,Ua: NEGATIVE
Nitrite: NEGATIVE
Protein, ur: NEGATIVE mg/dL
Specific Gravity, Urine: 1.013 (ref 1.005–1.030)
pH: 5 (ref 5.0–8.0)

## 2020-10-23 LAB — CBC WITH DIFFERENTIAL/PLATELET
Abs Immature Granulocytes: 0.06 10*3/uL (ref 0.00–0.07)
Basophils Absolute: 0.1 10*3/uL (ref 0.0–0.1)
Basophils Relative: 0 %
Eosinophils Absolute: 0 10*3/uL (ref 0.0–0.5)
Eosinophils Relative: 0 %
HCT: 41.2 % (ref 39.0–52.0)
Hemoglobin: 13.3 g/dL (ref 13.0–17.0)
Immature Granulocytes: 0 %
Lymphocytes Relative: 14 %
Lymphs Abs: 2.1 10*3/uL (ref 0.7–4.0)
MCH: 27.5 pg (ref 26.0–34.0)
MCHC: 32.3 g/dL (ref 30.0–36.0)
MCV: 85.3 fL (ref 80.0–100.0)
Monocytes Absolute: 1.3 10*3/uL — ABNORMAL HIGH (ref 0.1–1.0)
Monocytes Relative: 8 %
Neutro Abs: 11.7 10*3/uL — ABNORMAL HIGH (ref 1.7–7.7)
Neutrophils Relative %: 78 %
Platelets: 282 10*3/uL (ref 150–400)
RBC: 4.83 MIL/uL (ref 4.22–5.81)
RDW: 13.5 % (ref 11.5–15.5)
WBC: 15.2 10*3/uL — ABNORMAL HIGH (ref 4.0–10.5)
nRBC: 0 % (ref 0.0–0.2)

## 2020-10-23 LAB — ECHOCARDIOGRAM COMPLETE
AR max vel: 0.94 cm2
AV Area VTI: 0.93 cm2
AV Area mean vel: 0.95 cm2
AV Mean grad: 5 mmHg
AV Peak grad: 8.9 mmHg
Ao pk vel: 1.49 m/s
Area-P 1/2: 7.74 cm2
Height: 67 in
S' Lateral: 6.4 cm
Weight: 4320 oz

## 2020-10-23 LAB — MRSA PCR SCREENING: MRSA by PCR: NEGATIVE

## 2020-10-23 LAB — RAPID URINE DRUG SCREEN, HOSP PERFORMED
Amphetamines: NOT DETECTED
Barbiturates: NOT DETECTED
Benzodiazepines: NOT DETECTED
Cocaine: NOT DETECTED
Opiates: NOT DETECTED
Tetrahydrocannabinol: NOT DETECTED

## 2020-10-23 LAB — EXPECTORATED SPUTUM ASSESSMENT W GRAM STAIN, RFLX TO RESP C

## 2020-10-23 LAB — PHOSPHORUS: Phosphorus: 3 mg/dL (ref 2.5–4.6)

## 2020-10-23 LAB — LACTIC ACID, PLASMA: Lactic Acid, Venous: 1.4 mmol/L (ref 0.5–1.9)

## 2020-10-23 LAB — MAGNESIUM
Magnesium: 1.8 mg/dL (ref 1.7–2.4)
Magnesium: 1.9 mg/dL (ref 1.7–2.4)

## 2020-10-23 LAB — STREP PNEUMONIAE URINARY ANTIGEN: Strep Pneumo Urinary Antigen: NEGATIVE

## 2020-10-23 LAB — PROCALCITONIN: Procalcitonin: <0.1 ng/mL

## 2020-10-23 LAB — HIV ANTIBODY (ROUTINE TESTING W REFLEX): HIV Screen 4th Generation wRfx: NONREACTIVE

## 2020-10-23 LAB — TSH: TSH: 1.775 u[IU]/mL (ref 0.350–4.500)

## 2020-10-23 MED ORDER — FUROSEMIDE 10 MG/ML IJ SOLN
20.0000 mg | Freq: Once | INTRAMUSCULAR | Status: AC
  Administered 2020-10-23: 20 mg via INTRAVENOUS
  Filled 2020-10-23: qty 2

## 2020-10-23 MED ORDER — MAGNESIUM SULFATE 2 GM/50ML IV SOLN
2.0000 g | Freq: Once | INTRAVENOUS | Status: AC
  Administered 2020-10-23: 2 g via INTRAVENOUS
  Filled 2020-10-23: qty 50

## 2020-10-23 MED ORDER — DM-GUAIFENESIN ER 30-600 MG PO TB12
1.0000 | ORAL_TABLET | Freq: Two times a day (BID) | ORAL | Status: DC
  Administered 2020-10-23 – 2020-10-27 (×9): 1 via ORAL
  Filled 2020-10-23 (×9): qty 1

## 2020-10-23 MED ORDER — PERFLUTREN LIPID MICROSPHERE
1.0000 mL | INTRAVENOUS | Status: AC | PRN
  Administered 2020-10-23: 2 mL via INTRAVENOUS
  Filled 2020-10-23: qty 10

## 2020-10-23 MED ORDER — ENOXAPARIN SODIUM 60 MG/0.6ML IJ SOSY
60.0000 mg | PREFILLED_SYRINGE | INTRAMUSCULAR | Status: DC
  Administered 2020-10-24: 60 mg via SUBCUTANEOUS
  Filled 2020-10-23: qty 0.6

## 2020-10-23 NOTE — Hospital Course (Signed)
David Vang is a 30 y.o. male with medical history significant for morbid obesity, recurrent pneumonia, who is admitted to [facility] on 10/22/2020 by way of transfer from [facility] with suspected sepsis due to bilateral pneumonia after presenting from home to the latter facility complaining of shortness of breath.  Of note, pt has hx of recurrent pneumonia.  This time, had been treated outpatient with oral antibiotics x 2 (Zithromax, Levaquin) without improvement.

## 2020-10-23 NOTE — Progress Notes (Signed)
  Echocardiogram 2D Echocardiogram has been performed.  Roosvelt Maser F 10/23/2020, 11:04 AM

## 2020-10-23 NOTE — Progress Notes (Signed)
PROGRESS NOTE    David Vang   ONG:[id number]  DOB: 01/30/1991  PCP: Pcp, No    DOA: 10/22/2020 LOS: 1   Brief Narrative   David Vang is a 30 y.o. male with medical history significant for morbid obesity, recurrent pneumonia, who is admitted to [facility] on 10/22/2020 by way of transfer from [facility] with suspected sepsis due to bilateral pneumonia after presenting from home to the latter facility complaining of shortness of breath.  Of note, pt has hx of recurrent pneumonia.  This time, had been treated outpatient with oral antibiotics x 2 (Zithromax, Levaquin) without improvement.    Assessment & Plan   Principal Problem:   CAP (community acquired pneumonia) Active Problems:   Sepsis (HCC)   Leukocytosis   SOB (shortness of breath)   Ventricular bigeminy   Elevated troponin   Sepsis secondary to bilateral pneumonia -   Continue empiric cefepime. Stop vancomycin with negative MRSA PCR. Follow-up pending blood and sputum cultures, antigens for Legionella and strep pneumo   Ventricular bigeminy -noted at [facility] ED and earlier day of admission, HR's 120-130's.  Follow-up pending echo.  Refrain beta-blocker use for now.  Maintain K>4, Mg>2.  Telemetry. Note: Family history in father of cardiomyopathy in her late 82s or early 105s that ultimately required LVAD.  Elevated troponin suspect demand ischemia due to tachycardia. Troponin 47>>46, trended flat and patient without chest pain.  Unlikely ACS.  Evaluation as above.  Morbid obesity: Body mass index is 42.29 kg/m.  Complicates overall care and prognosis.  Recommend lifestyle modifications including physical activity and diet for weight loss and overall long-term health.   DVT prophylaxis: enoxaparin (LOVENOX) injection 40 mg Start: 10/23/20 0900   Diet:  Diet Orders (From admission, onward)    Start     Ordered   10/22/20 2232  Diet regular Room service appropriate? Yes;  Fluid consistency: Thin  Diet effective now       Question Answer Comment  Room service appropriate? Yes   Fluid consistency: Thin      10/22/20 2231            Code Status: Full Code    Subjective 10/23/20    Patient seen this morning at bedside.  He continue has have coughing spells that take his breath away.  Feels quite hot but denies episodes of chills.  Did not sleep at all and reports very poor appetite.   Disposition Plan & Communication   Status is: Inpatient  Remains inpatient appropriate because:IV treatments appropriate due to intensity of illness or inability to take PO   Dispo: The patient is from: Home              Anticipated d/c is to: Home              Patient currently is not medically stable to d/c.   Difficult to place patient No    Consults, Procedures, Significant Events   Consultants:   None  Procedures:   None  Antimicrobials:  Anti-infectives (From admission, onward)   Start     Dose/Rate Route Frequency Ordered Stop   10/23/20 0600  ceFEPIme (MAXIPIME) 2 g in sodium chloride 0.9 % 100 mL IVPB        2 g 200 mL/hr over 30 Minutes Intravenous Every 8 hours 10/22/20 2257     10/23/20 0600  vancomycin (VANCOREADY) IVPB 1750 mg/350 mL  Status:  Discontinued        1,750  mg 175 mL/hr over 120 Minutes Intravenous Every 8 hours 10/22/20 2259 10/23/20 0922   10/22/20 2345  vancomycin (VANCOREADY) IVPB 2000 mg/400 mL        2,000 mg 200 mL/hr over 120 Minutes Intravenous  Once 10/22/20 2249 10/23/20 0316   10/22/20 2345  ceFEPIme (MAXIPIME) 2 g in sodium chloride 0.9 % 100 mL IVPB        2 g 200 mL/hr over 30 Minutes Intravenous  Once 10/22/20 2249 10/23/20 0039   10/22/20 1700  cefTRIAXone (ROCEPHIN) 2 g in sodium chloride 0.9 % 100 mL IVPB        2 g 200 mL/hr over 30 Minutes Intravenous  Once 10/22/20 1654 10/22/20 1803   10/22/20 1700  doxycycline (VIBRA-TABS) tablet 100 mg        100 mg Oral  Once 10/22/20 1654 10/22/20 1721         Micro    Objective   Vitals:   10/22/20 1912 10/22/20 2211 10/23/20 0533 10/23/20 1005  BP: 124/89 110/85 116/81 120/60  Pulse: 65 63 (!) 102 60  Resp: (!) 30 (!) 24 19 16   Temp: 98.3 F (36.8 C) 99.3 F (37.4 C) 98.2 F (36.8 C) 98.7 F (37.1 C)  TempSrc: Oral Oral Oral Oral  SpO2: 96% 96% 93% 95%  Weight:      Height:        Intake/Output Summary (Last 24 hours) at 10/23/2020 1516 Last data filed at 10/23/2020 1200 Gross per 24 hour  Intake 457 ml  Output 1650 ml  Net -1193 ml   Filed Weights   10/22/20 1539  Weight: 122.5 kg    Physical Exam:  General exam: awake, alert, no acute distress,, obese, ill-appearing HEENT: Clear conjunctiva, moist mucus membranes, hearing grossly normal  Respiratory system: Bilateral rhonchi, no wheezes, increased normal respiratory effort with mild conversational dyspnea, no accessory muscle use, frequent coughing sounds productive. Cardiovascular system: normal S1/S2, RRR, no JVD, murmurs, rubs, gallops,  no pedal edema.   Gastrointestinal system: soft, NT, ND, no HSM felt, +bowel sounds. Central nervous system: A&O x3. no gross focal neurologic deficits, normal speech Skin: Diaphoretic, warm, intact Psychiatry: normal mood, congruent affect, judgement and insight appear normal  Labs   Data Reviewed: I have personally reviewed following labs and imaging studies  CBC: Recent Labs  Lab 10/22/20 1625 10/23/20 0148  WBC 15.1* 15.2*  NEUTROABS 12.8* 11.7*  HGB 13.8 13.3  HCT 42.2 41.2  MCV 83.4 85.3  PLT 312 282   Basic Metabolic Panel: Recent Labs  Lab 10/22/20 1625 10/22/20 2317 10/23/20 0148  NA 135  --  136  K 4.0  --  3.9  CL 105  --  102  CO2 20*  --  25  GLUCOSE 96  --  123*  BUN 11  --  10  CREATININE 0.99  --  1.04  CALCIUM 8.0*  --  8.6*  MG  --  1.8 1.9  PHOS  --   --  3.0   GFR: Estimated Creatinine Clearance: 131.5 mL/min (by C-G formula based on SCr of 1.04 mg/dL). Liver Function  Tests: Recent Labs  Lab 10/23/20 0148  AST 30  ALT 55*  ALKPHOS 53  BILITOT 1.2  PROT 5.9*  ALBUMIN 2.9*   No results for input(s): LIPASE, AMYLASE in the last 168 hours. No results for input(s): AMMONIA in the last 168 hours. Coagulation Profile: No results for input(s): INR, PROTIME in the last 168 hours. Cardiac Enzymes:  No results for input(s): CKTOTAL, CKMB, CKMBINDEX, TROPONINI in the last 168 hours. BNP (last 3 results) No results for input(s): PROBNP in the last 8760 hours. HbA1C: No results for input(s): HGBA1C in the last 72 hours. CBG: No results for input(s): GLUCAP in the last 168 hours. Lipid Profile: No results for input(s): CHOL, HDL, LDLCALC, TRIG, CHOLHDL, LDLDIRECT in the last 72 hours. Thyroid Function Tests: Recent Labs    10/23/20 0503  TSH 1.775   Anemia Panel: No results for input(s): VITAMINB12, FOLATE, FERRITIN, TIBC, IRON, RETICCTPCT in the last 72 hours. Sepsis Labs: Recent Labs  Lab 10/22/20 2317 10/23/20 0503  PROCALCITON <0.10  --   LATICACIDVEN  --  1.4    Recent Results (from the past 240 hour(s))  Resp Panel by RT-PCR (Flu A&B, Covid) Nasopharyngeal Swab     Status: None   Collection Time: 10/22/20  4:25 PM   Specimen: Nasopharyngeal Swab; Nasopharyngeal(NP) swabs in vial transport medium  Result Value Ref Range Status   SARS Coronavirus 2 by RT PCR NEGATIVE NEGATIVE Final    Comment: (NOTE) SARS-CoV-2 target nucleic acids are NOT DETECTED.  The SARS-CoV-2 RNA is generally detectable in upper respiratory specimens during the acute phase of infection. The lowest concentration of SARS-CoV-2 viral copies this assay can detect is 138 copies/mL. A negative result does not preclude SARS-Cov-2 infection and should not be used as the sole basis for treatment or other patient management decisions. A negative result may occur with  improper specimen collection/handling, submission of specimen other than nasopharyngeal swab, presence of  viral mutation(s) within the areas targeted by this assay, and inadequate number of viral copies(<138 copies/mL). A negative result must be combined with clinical observations, patient history, and epidemiological information. The expected result is Negative.  Fact Sheet for Patients:  BloggerCourse.com  Fact Sheet for Healthcare Providers:  SeriousBroker.it  This test is no t yet approved or cleared by the [place] FDA and  has been authorized for detection and/or diagnosis of SARS-CoV-2 by FDA under an Emergency Use Authorization (EUA). This EUA will remain  in effect (meaning this test can be used) for the duration of the COVID-19 declaration under Section 564(b)(1) of the Act, 21 U.S.C.section 360bbb-3(b)(1), unless the authorization is terminated  or revoked sooner.       Influenza A by PCR NEGATIVE NEGATIVE Final   Influenza B by PCR NEGATIVE NEGATIVE Final    Comment: (NOTE) The Xpert Xpress SARS-CoV-2/FLU/RSV plus assay is intended as an aid in the diagnosis of influenza from Nasopharyngeal swab specimens and should not be used as a sole basis for treatment. Nasal washings and aspirates are unacceptable for Xpert Xpress SARS-CoV-2/FLU/RSV testing.  Fact Sheet for Patients: BloggerCourse.com  Fact Sheet for Healthcare Providers: SeriousBroker.it  This test is not yet approved or cleared by the [place] FDA and has been authorized for detection and/or diagnosis of SARS-CoV-2 by FDA under an Emergency Use Authorization (EUA). This EUA will remain in effect (meaning this test can be used) for the duration of the COVID-19 declaration under Section 564(b)(1) of the Act, 21 U.S.C. section 360bbb-3(b)(1), unless the authorization is terminated or revoked.  Performed at [facility], [address], [place], [place] [address]   Culture, blood  (Routine X 2) w Reflex to ID Panel     Status: None (Preliminary result)   Collection Time: 10/22/20 11:17 PM   Specimen: BLOOD  Result Value Ref Range Status   Specimen Description BLOOD LEFT ANTECUBITAL  Final   Special Requests   Final    BOTTLES DRAWN AEROBIC ONLY Blood Culture results may not be optimal due to an inadequate volume of blood received in culture bottles   Culture   Final    NO GROWTH < 12 HOURS Performed at [facility], [address] [place], [place] [address]    Report Status PENDING  Incomplete  Culture, blood (Routine X 2) w Reflex to ID Panel     Status: None (Preliminary result)   Collection Time: 10/22/20 11:27 PM   Specimen: BLOOD  Result Value Ref Range Status   Specimen Description BLOOD LEFT ANTECUBITAL  Final   Special Requests   Final    BOTTLES DRAWN AEROBIC ONLY Blood Culture results may not be optimal due to an inadequate volume of blood received in culture bottles   Culture   Final    NO GROWTH < 12 HOURS Performed at [facility], [address] [place], [place] [address]    Report Status PENDING  Incomplete  MRSA PCR Screening     Status: None   Collection Time: 10/23/20  1:57 AM   Specimen: Nasal Mucosa; Nasopharyngeal  Result Value Ref Range Status   MRSA by PCR NEGATIVE NEGATIVE Final    Comment:        The GeneXpert MRSA Assay (FDA approved for NASAL specimens only), is one component of a comprehensive MRSA colonization surveillance program. It is not intended to diagnose MRSA infection nor to guide or monitor treatment for MRSA infections. Performed at [facility], [address] [place], [place] [address]   Expectorated Sputum Assessment w Gram Stain, Rflx to Resp Cult     Status: None   Collection Time: 10/23/20  5:11 AM   Specimen: Sputum  Result Value Ref Range Status   Specimen Description SPUTUM  Final   Special Requests NONE  Final   Sputum evaluation   Final    Sputum specimen not acceptable for  testing.  Please recollect.   RESULT CALLED TO, READ BACK BY AND VERIFIED WITH: RN B.YAP ON [id number] AT 1424 BY E.PARRISH Performed at [facility], [address] [place], [place] [address]    Report Status 10/23/2020 FINAL  Final      Imaging Studies   CT Angio Chest PE W and/or Wo Contrast  Result Date: 10/22/2020 CLINICAL DATA:  Worsening shortness of breath over the last 2 weeks. Diagnosed with pneumonia 2 weeks ago. Patient has been on antibiotics without relief. EXAM: CT ANGIOGRAPHY CHEST WITH CONTRAST TECHNIQUE: Multidetector CT imaging of the chest was performed using the standard protocol during bolus administration of intravenous contrast. Multiplanar CT image reconstructions and MIPs were obtained to evaluate the vascular anatomy. CONTRAST:  100mL OMNIPAQUE IOHEXOL 350 MG/ML SOLN COMPARISON:  Current chest radiograph. FINDINGS: Cardiovascular: Pulmonary arteries are well opacified. There is no evidence of a pulmonary embolism. Heart is borderline to mildly enlarged. No pericardial effusion. Great vessels are normal in caliber. Mediastinum/Nodes: Visualized thyroid is unremarkable. There are shotty prominent to mildly enlarged mediastinal and right hilar lymph nodes. A superior right paratracheal lymph node measures 1.4 cm in short axis. A probable conglomeration of right paratracheal lymph nodes just above the azygos arch measured 2 cm in short axis. An enlarged prevascular node measures 1.1 cm short axis. A right superior hilar node measures 1.6 cm in short axis. Trachea and esophagus are unremarkable. Lungs/Pleura: Trace right pleural effusion. Bilateral airspace opacities which  predominate on the right, noted in all lobes, with similar appearing consolidation in the left lower lobe, sparing the left upper lobe. No interstitial thickening. No pneumothorax. Upper Abdomen: Decreased attenuation of the liver consistent with fatty infiltration. Otherwise unremarkable. Musculoskeletal: No  chest wall abnormality. No acute or significant osseous findings. Review of the MIP images confirms the above findings. IMPRESSION: 1. No evidence of a pulmonary embolism. 2. Extensive airspace lung opacities, more evident on the right, as detailed above. Patient has borderline cardiomegaly and a trace right pleural effusion. Findings may reflect asymmetric pulmonary edema or multifocal pneumonia. Electronically Signed   By: Amie Portland M.D.   On: 10/22/2020 17:48   DG Chest Portable 1 View  Result Date: 10/22/2020 CLINICAL DATA:  30 year old male with shortness of breath. Evaluate for pneumonia. EXAM: PORTABLE CHEST 1 VIEW COMPARISON:  None. FINDINGS: There is mild cardiomegaly with mild vascular congestion. Bilateral mid to lower lung field densities, right greater than left may represent edema or pneumonia. No large pleural effusion. No pneumothorax. No acute osseous pathology. IMPRESSION: 1. Cardiomegaly with mild vascular congestion. 2. Bilateral mid to lower lung field densities, right greater than left may represent edema or pneumonia. Electronically Signed   By: Elgie Collard M.D.   On: 10/22/2020 16:07   ECHOCARDIOGRAM COMPLETE  Result Date: 10/23/2020    ECHOCARDIOGRAM REPORT   Patient Name:   David Vang Date of Exam: 10/23/2020 Medical Rec #:  [id number]        Height:       67.0 in Accession #:    [id number]       Weight:       270.0 lb Date of Birth:  03-Oct-1990       BSA:          2.297 m Patient Age:    29 years         BP:           120/60 mmHg Patient Gender: M                HR:           60 bpm. Exam Location:  Inpatient Procedure: 2D Echo, Cardiac Doppler, Color Doppler and Intracardiac            Opacification Agent Indications:    I50.31 Acute diastolic (congestive) heart failure  History:        Patient has no prior history of Echocardiogram examinations.                 Risk Factors:Family history of heart disease. Bilateral                 pneumonia. 5th time having  pneumonia.  Sonographer:    Roosvelt Maser RDCS Referring Phys: [id number] Angie Fava IMPRESSIONS  1. Left ventricular ejection fraction, by estimation, is 20 to 25%. The left ventricle has severely decreased function. The left ventricle demonstrates global hypokinesis. The left ventricular internal cavity size was moderately dilated. Left ventricular diastolic parameters are consistent with Grade II diastolic dysfunction (pseudonormalization). Elevated left atrial pressure.  2. Right ventricular systolic function is normal. The right ventricular size is normal.  3. The mitral valve is normal in structure. Mild to moderate mitral valve regurgitation. No evidence of mitral stenosis.  4. The aortic valve is normal in structure. Aortic valve regurgitation is not visualized. No aortic stenosis is present.  5. The inferior vena cava is normal in size with greater than 50% respiratory  variability, suggesting right atrial pressure of 3 mmHg. FINDINGS  Left Ventricle: Left ventricular ejection fraction, by estimation, is 20 to 25%. The left ventricle has severely decreased function. The left ventricle demonstrates global hypokinesis. Definity contrast agent was given IV to delineate the left ventricular endocardial borders. The left ventricular internal cavity size was moderately dilated. There is no left ventricular hypertrophy. Left ventricular diastolic parameters are consistent with Grade II diastolic dysfunction (pseudonormalization). Elevated left atrial pressure. Right Ventricle: The right ventricular size is normal. No increase in right ventricular wall thickness. Right ventricular systolic function is normal. Left Atrium: Left atrial size was normal in size. Right Atrium: Right atrial size was normal in size. Pericardium: There is no evidence of pericardial effusion. Mitral Valve: The mitral valve is normal in structure. Mild to moderate mitral valve regurgitation. No evidence of mitral valve stenosis. Tricuspid  Valve: The tricuspid valve is normal in structure. Tricuspid valve regurgitation is mild . No evidence of tricuspid stenosis. Aortic Valve: The aortic valve is normal in structure. Aortic valve regurgitation is not visualized. No aortic stenosis is present. Aortic valve mean gradient measures 5.0 mmHg. Aortic valve peak gradient measures 8.9 mmHg. Aortic valve area, by VTI measures 0.93 cm. Pulmonic Valve: The pulmonic valve was normal in structure. Pulmonic valve regurgitation is not visualized. No evidence of pulmonic stenosis. Aorta: The aortic root is normal in size and structure. Venous: The inferior vena cava is normal in size with greater than 50% respiratory variability, suggesting right atrial pressure of 3 mmHg. IAS/Shunts: No atrial level shunt detected by color flow Doppler.  LEFT VENTRICLE PLAX 2D LVIDd:         6.60 cm  Diastology LVIDs:         6.40 cm  LV e' medial:    11.00 cm/s LV PW:         0.80 cm  LV E/e' medial:  14.7 LV IVS:        0.70 cm  LV e' lateral:   9.68 cm/s LVOT diam:     1.80 cm  LV E/e' lateral: 16.7 LV SV:         23 LV SV Index:   10 LVOT Area:     2.54 cm  RIGHT VENTRICLE             IVC RV Basal diam:  3.70 cm     IVC diam: 1.80 cm RV S prime:     14.70 cm/s TAPSE (M-mode): 3.0 cm LEFT ATRIUM             Index       RIGHT ATRIUM           Index LA diam:        4.60 cm 2.00 cm/m  RA Area:     13.30 cm LA Vol (A2C):   60.4 ml 26.29 ml/m RA Volume:   32.50 ml  14.15 ml/m LA Vol (A4C):   32.6 ml 14.19 ml/m LA Biplane Vol: 47.1 ml 20.50 ml/m  AORTIC VALVE AV Area (Vmax):    0.94 cm AV Area (Vmean):   0.95 cm AV Area (VTI):     0.93 cm AV Vmax:           149.00 cm/s AV Vmean:          105.000 cm/s AV VTI:            0.247 m AV Peak Grad:      8.9 mmHg AV Mean Grad:  5.0 mmHg LVOT Vmax:         54.90 cm/s LVOT Vmean:        39.400 cm/s LVOT VTI:          0.090 m LVOT/AV VTI ratio: 0.37  AORTA Ao Root diam: 3.00 cm Ao Asc diam:  2.50 cm MITRAL VALVE MV Area (PHT): 7.74  cm     SHUNTS MV Decel Time: 98 msec      Systemic VTI:  0.09 m MV E velocity: 162.00 cm/s  Systemic Diam: 1.80 cm MV A velocity: 93.70 cm/s MV E/A ratio:  1.73 Donato Schultz MD Electronically signed by Donato Schultz MD Signature Date/Time: 10/23/2020/11:12:33 AM    Final      Medications   Scheduled Meds: . dextromethorphan-guaiFENesin  1 tablet Oral BID  . enoxaparin (LOVENOX) injection  40 mg Subcutaneous Q24H   Continuous Infusions: . ceFEPime (MAXIPIME) IV 2 g (10/23/20 1439)       LOS: 1 day    Time spent: 30 minutes    Pennie Banter, DO [facility]  10/23/2020, 3:16 PM      If 7PM-7AM, please contact night-coverage. How to contact the [facility] Attending or Consulting provider 7A - 7P or covering provider during after hours 7P -7A, for this patient?    1. Check the care team in [facility] and look for a) attending/consulting [facility] provider listed and b) the [facility] team listed 2. Log into [email or web address] and use [facility] universal password to access. If you do not have the password, please contact the hospital operator. 3. Locate the [facility] provider you are looking for under [facility] and page to a number that you can be directly reached. 4. If you still have difficulty reaching the provider, please page the [facility] (Director on Call) for the Hospitalists listed on amion for assistance.

## 2020-10-23 NOTE — Plan of Care (Signed)
  Problem: Education: Goal: Knowledge of General Education information will improve Description: Including pain rating scale, medication(s)/side effects and non-pharmacologic comfort measures Outcome: Completed/Met   Problem: Clinical Measurements: Goal: Diagnostic test results will improve Outcome: Completed/Met   

## 2020-10-24 DIAGNOSIS — I5043 Acute on chronic combined systolic (congestive) and diastolic (congestive) heart failure: Secondary | ICD-10-CM

## 2020-10-24 DIAGNOSIS — I493 Ventricular premature depolarization: Secondary | ICD-10-CM

## 2020-10-24 DIAGNOSIS — D72825 Bandemia: Secondary | ICD-10-CM

## 2020-10-24 DIAGNOSIS — I5022 Chronic systolic (congestive) heart failure: Secondary | ICD-10-CM | POA: Diagnosis present

## 2020-10-24 DIAGNOSIS — I429 Cardiomyopathy, unspecified: Secondary | ICD-10-CM

## 2020-10-24 DIAGNOSIS — I5021 Acute systolic (congestive) heart failure: Secondary | ICD-10-CM

## 2020-10-24 LAB — BASIC METABOLIC PANEL
Anion gap: 9 (ref 5–15)
BUN: 6 mg/dL (ref 6–20)
CO2: 24 mmol/L (ref 22–32)
Calcium: 8.1 mg/dL — ABNORMAL LOW (ref 8.9–10.3)
Chloride: 105 mmol/L (ref 98–111)
Creatinine, Ser: 0.88 mg/dL (ref 0.61–1.24)
GFR, Estimated: >60 mL/min (ref >60)
Glucose, Bld: 95 mg/dL (ref 70–99)
Potassium: 3.9 mmol/L (ref 3.5–5.1)
Sodium: 138 mmol/L (ref 135–145)

## 2020-10-24 LAB — CBC
HCT: 43.7 % (ref 39.0–52.0)
Hemoglobin: 14.1 g/dL (ref 13.0–17.0)
MCH: 27.3 pg (ref 26.0–34.0)
MCHC: 32.3 g/dL (ref 30.0–36.0)
MCV: 84.7 fL (ref 80.0–100.0)
Platelets: 324 10*3/uL (ref 150–400)
RBC: 5.16 MIL/uL (ref 4.22–5.81)
RDW: 13.5 % (ref 11.5–15.5)
WBC: 12.8 10*3/uL — ABNORMAL HIGH (ref 4.0–10.5)
nRBC: 0 % (ref 0.0–0.2)

## 2020-10-24 LAB — MAGNESIUM: Magnesium: 1.7 mg/dL (ref 1.7–2.4)

## 2020-10-24 LAB — C DIFFICILE QUICK SCREEN W PCR REFLEX
C Diff antigen: NEGATIVE
C Diff interpretation: NOT DETECTED
C Diff toxin: NEGATIVE

## 2020-10-24 LAB — MYCOPLASMA PNEUMONIAE ANTIBODY, IGM: Mycoplasma pneumo IgM: <770 U/mL (ref 0–769)

## 2020-10-24 LAB — PROCALCITONIN: Procalcitonin: 0.1 ng/mL

## 2020-10-24 MED ORDER — MEXILETINE HCL 150 MG PO CAPS
150.0000 mg | ORAL_CAPSULE | Freq: Three times a day (TID) | ORAL | Status: DC
  Administered 2020-10-24 (×2): 150 mg via ORAL
  Filled 2020-10-24 (×4): qty 1

## 2020-10-24 MED ORDER — MAGNESIUM SULFATE 2 GM/50ML IV SOLN
2.0000 g | Freq: Once | INTRAVENOUS | Status: DC
  Filled 2020-10-24: qty 50

## 2020-10-24 MED ORDER — SODIUM CHLORIDE 0.9 % IV SOLN: INTRAVENOUS | Status: DC

## 2020-10-24 MED ORDER — MAGNESIUM SULFATE 4 GM/100ML IV SOLN
4.0000 g | Freq: Once | INTRAVENOUS | Status: AC
  Administered 2020-10-24: 4 g via INTRAVENOUS
  Filled 2020-10-24: qty 100

## 2020-10-24 MED ORDER — POTASSIUM CHLORIDE CRYS ER 20 MEQ PO TBCR
20.0000 meq | EXTENDED_RELEASE_TABLET | Freq: Once | ORAL | Status: AC
  Administered 2020-10-24: 20 meq via ORAL
  Filled 2020-10-24: qty 1

## 2020-10-24 MED ORDER — LOSARTAN POTASSIUM 25 MG PO TABS
12.5000 mg | ORAL_TABLET | Freq: Every day | ORAL | Status: DC
  Administered 2020-10-24 – 2020-10-26 (×3): 12.5 mg via ORAL
  Filled 2020-10-24 (×3): qty 1

## 2020-10-24 MED ORDER — SODIUM CHLORIDE 0.9% FLUSH
3.0000 mL | Freq: Two times a day (BID) | INTRAVENOUS | Status: DC
  Administered 2020-10-24 – 2020-10-27 (×6): 3 mL via INTRAVENOUS

## 2020-10-24 MED ORDER — ASPIRIN 81 MG PO CHEW
81.0000 mg | CHEWABLE_TABLET | ORAL | Status: AC
  Administered 2020-10-25: 81 mg via ORAL
  Filled 2020-10-24: qty 1

## 2020-10-24 MED ORDER — FUROSEMIDE 10 MG/ML IJ SOLN
40.0000 mg | Freq: Two times a day (BID) | INTRAMUSCULAR | Status: DC
  Administered 2020-10-24 – 2020-10-26 (×5): 40 mg via INTRAVENOUS
  Filled 2020-10-24 (×6): qty 4

## 2020-10-24 MED ORDER — SPIRONOLACTONE 12.5 MG HALF TABLET
12.5000 mg | ORAL_TABLET | Freq: Every day | ORAL | Status: DC
  Administered 2020-10-24 – 2020-10-26 (×3): 12.5 mg via ORAL
  Filled 2020-10-24 (×4): qty 1

## 2020-10-24 MED ORDER — SODIUM CHLORIDE 0.9% FLUSH: 3.0000 mL | INTRAVENOUS | Status: DC | PRN

## 2020-10-24 NOTE — Plan of Care (Signed)
°  Problem: Health Behavior/Discharge Planning: °Goal: Ability to manage health-related needs will improve °Outcome: Progressing °  °Problem: Clinical Measurements: °Goal: Will remain free from infection °Outcome: Progressing °  °Problem: Activity: °Goal: Risk for activity intolerance will decrease °Outcome: Progressing °  °Problem: Nutrition: °Goal: Adequate nutrition will be maintained °Outcome: Progressing °  °Problem: Coping: °Goal: Level of anxiety will decrease °Outcome: Progressing °  °

## 2020-10-24 NOTE — TOC Initial Note (Signed)
Transition of Care (TOC) - Initial/Assessment Note  Heart Failure   Patient Details  Name: David Vang MRN: [id number] Date of Birth: 25-Dec-1990  Transition of Care [facility]) CM/SW Contact:    David Vang, LCSWA Phone Number: 10/24/2020, 3:25 PM  Clinical Narrative:        CSW spoke with the patient at bedside and completed a very brief SDOH screening with the patient who reported not having health insurance and no primary care provider because of this. CSW provided the patient with a PCP list and a CAFA application and to bring back with him at his follow up appointment with the [facility] outpatient clinic. David Vang reported the cardiologist said something about a sleep study but without having health insurance this may be difficult and what next steps need to occur for him as patient reported snoring nightly. CSW will work on scheduling David Vang a PCP appointment. CSW provided the patient with social workers name, number, and position and to reach out to CSW as other social needs arise.           TOC will continue to follow throughout discharge.    Barriers to Discharge: Continued Medical Work up   Patient Goals and CMS Choice        Expected Discharge Plan and Services   In-house Referral: Clinical Social Work     Living arrangements for the past 2 months: Apartment                                      Prior Living Arrangements/Services Living arrangements for the past 2 months: Apartment Lives with:: Self,Spouse Patient language and need for interpreter reviewed:: Yes        Need for Family Participation in Patient Care: No (Comment) Care giver support system in place?: No (comment)   Criminal Activity/Legal Involvement Pertinent to Current Situation/Hospitalization: No - Comment as needed  Activities of Daily Living Home Assistive Devices/Equipment: None ADL Screening (condition at time of admission) Patient's cognitive ability adequate to safely  complete daily activities?: Yes Is the patient deaf or have difficulty hearing?: No Does the patient have difficulty seeing, even when wearing glasses/contacts?: No Does the patient have difficulty concentrating, remembering, or making decisions?: No Patient able to express need for assistance with ADLs?: No Does the patient have difficulty dressing or bathing?: No Independently performs ADLs?: No Communication: Independent Dressing (OT): Independent Grooming: Independent Feeding: Independent Bathing: Independent Toileting: Independent In/Out Bed: Independent Walks in Home: Independent Does the patient have difficulty walking or climbing stairs?: Yes Weakness of Legs: None Weakness of Arms/Hands: None  Permission Sought/Granted                  Emotional Assessment Appearance:: Appears stated age Attitude/Demeanor/Rapport: Engaged Affect (typically observed): Pleasant Orientation: : Oriented to Self,Oriented to Place,Oriented to  Time,Oriented to Situation   Psych Involvement: No (comment)  Admission diagnosis:  CAP (community acquired pneumonia) [J18.9] Respiratory failure, unspecified chronicity, unspecified whether with hypoxia or hypercapnia (HCC) [J96.90] Community acquired pneumonia, unspecified laterality [J18.9] Heart failure, unspecified HF chronicity, unspecified heart failure type (HCC) [I50.9] Patient Active Problem List   Diagnosis Date Noted  . Sepsis (HCC) 10/23/2020  . Leukocytosis 10/23/2020  . SOB (shortness of breath) 10/23/2020  . Ventricular bigeminy 10/23/2020  . Elevated troponin 10/23/2020  . CAP (community acquired pneumonia) 10/22/2020   PCP:  Pcp, No Pharmacy:   [id number]  [address] [place], [place] - [address] AT [address] [place]. [place] [address] Phone: [phone] Fax: [phone]     Social Determinants of Health (SDOH) Interventions Food Insecurity  Interventions: Intervention Not Indicated Financial Strain Interventions: Other (Comment) (referral to community clinic for PCP and provided patient with a CAFA application.) Housing Interventions: Intervention Not Indicated Transportation Interventions: Intervention Not Indicated  Readmission Risk Interventions No flowsheet data found.  David Vang, MSW, LCSWA [phone] Heart Failure Social Worker

## 2020-10-24 NOTE — Plan of Care (Signed)
  Problem: Health Behavior/Discharge Planning: Goal: Ability to manage health-related needs will improve Outcome: Progressing   Problem: Clinical Measurements: Goal: Will remain free from infection Outcome: Progressing   Problem: Activity: Goal: Risk for activity intolerance will decrease Outcome: Progressing   Problem: Nutrition: Goal: Adequate nutrition will be maintained Outcome: Progressing   Problem: Coping: Goal: Level of anxiety will decrease Outcome: Progressing   Problem: Education: Goal: Ability to demonstrate management of disease process will improve Outcome: Progressing   Problem: Activity: Goal: Capacity to carry out activities will improve Outcome: Progressing   Problem: Cardiac: Goal: Ability to achieve and maintain adequate cardiopulmonary perfusion will improve Outcome: Progressing

## 2020-10-24 NOTE — H&P (View-Only) (Signed)
Advanced Heart Failure Team Consult Note   Primary Physician: Pcp, No PCP-Cardiologist:  None  Reason for Consultation: Newly Diagnosed acute systolic heart failure   HPI:    David Vang is seen today for evaluation of newly diagnosed acute systolic heart failure at the request of Dr. Cyndia Skeeters, Internal Medicine.   30 y/o male w/ morbid obesity and family h/o cardiomyopathy. His father was diagnosed w/ CM in his late 30s/ early 2s, ultimately requiring an LVAD. His father died in his 22s from brain hemorrhage.   Pt w/ recent respiratory symptoms, including SOB and productive cough. Was diagnosed w/ CAP ~2 weeks ago and prescribed a course of abx + prednisone w/o improved symptoms. Subsequently, went to [place] on 5/29 w/ worsening symptoms + subjective fever.  In the ED, he met early sepsis criteria and CXR and CT concerning for bilateral PNA + CHF. No PE on CT. WBC 15K, mTemp 99.6, tachycardic but normotensive. EKG showed ventricular bigeminy. COVID and Flu negative. Bicarb 20. Lactic acid only 1.4 BNP 183.  He was admitted and transferred to [facility] for further care. Blood cultures obtained and he was first placed on Vanc + cefepime. Also started on IV Lasix for CHF.   Echo completed showing severely reduced LVEF, 20-25% w/ global HK. GIIDM, no LVH, mild-mod MR. AoV normal. RV normal.   BPs have not been elevated. Tele shows high burden PVCs > 50/hr. TSH WNL. He reports high caffeine intake. Drinks [employer] daily + caffeenated soft drinks. Also h/o snoring but has never had a sleep study. He has been fully vaccinated for COVID and boosted. Other than recent PNA, no other viral illnesses's in recent months. Denies elicit drug use. No tobacco or ETOH. Denies any h/o ischemic like CP.   Overall, he is feeling much better after getting IV Lasix. SBPs soft, upper 90s-low 100s. Scr 0.88. K 3.9. Mg low at 1.7. He had 3 watery stools earlier today. C-diff test pending given recent  abx.   AHF team consulted to assist w/ further w/u and management of systolic HF.     Pertient Labs/Test  UDS negative  PCT <0.10  WBC 15K Hgb 13.8  Na 135  K 4.0 CO2 20>>25 SCr 0.99>>1.04 AST 30 ALT 55  Albumin 2.9  Lactic Acid 1.4   HS Trop 47>>46   TSH 1.775  HIV NR   BCx NGTD Sputum specimen not acceptable for testing. Needs recollection   2D Echo 10/23/20 1. Left ventricular ejection fraction, by estimation, is 20 to 25%. The left ventricle has severely decreased function. The left ventricle demonstrates global hypokinesis. The left ventricular internal cavity size was moderately dilated. Left ventricular diastolic parameters are consistent with Grade II diastolic dysfunction (pseudonormalization). Elevated left atrial pressure. 2. Right ventricular systolic function is normal. The right ventricular size is normal. 3. The mitral valve is normal in structure. Mild to moderate mitral valve regurgitation. No evidence of mitral stenosis. 4. The aortic valve is normal in structure. Aortic valve regurgitation is not visualized. No aortic stenosis is present. 5. The inferior vena cava is normal in size with greater than 50% respiratory variability, suggesting right atrial pressure of 3 mmHg.  Review of Systems: [y] = yes, _0  = no   . General: Weight gain _1 ; Weight loss _2 ; Anorexia _3 ; Fatigue _4 ; Fever [Y ]; Chills _5 ; Weakness _6   . Cardiac: Chest pain/pressure _7 ; Resting SOB [ Y]; Exertional SOB [  Y]; Orthopnea _0 ; Pedal Edema _1 ; Palpitations [Y ]; Syncope _2 ; Presyncope _3 ; Paroxysmal nocturnal dyspnea_4   . Pulmonary: Cough [Y ]; Wheezing_5 ; Hemoptysis_6 ; Sputum _7 ; Snoring _8   . GI: Vomiting_9 ; Dysphagia_10 ; Melena_11 ; Hematochezia _12 ; Heartburn_13 ; Abdominal pain _14 ; Constipation _15 ; Diarrhea _16 ; BRBPR _17   . GU: Hematuria_18 ; Dysuria _19 ; Nocturia_20   . Vascular: Pain in legs with walking _21 ; Pain in feet with lying flat _22 ; Non-healing  sores _23 ; Stroke _24 ; TIA _25 ; Slurred speech _26 ;  . Neuro: Headaches_27 ; Vertigo_28 ; Seizures_29 ; Paresthesias_30 ;Blurred vision _31 ; Diplopia _32 ; Vision changes _33   . Ortho/Skin: Arthritis _34 ; Joint pain _35 ; Muscle pain _36 ; Joint swelling _37 ; Back Pain _38 ; Rash _39   . Psych: Depression_40 ; Anxiety_41   . Heme: Bleeding problems _42 ; Clotting disorders _43 ; Anemia _44   . Endocrine: Diabetes _45 ; Thyroid dysfunction_46   Home Medications Prior to Admission medications   Medication Sig Start Date End Date Taking? Authorizing Provider  albuterol (VENTOLIN HFA) 108 (90 Base) MCG/ACT inhaler Inhale 1-2 puffs into the lungs every 4 (four) hours as needed for wheezing or shortness of breath. 10/19/20  Yes [provider]  ibuprofen (ADVIL) 200 MG tablet Take 400 mg by mouth every 6 (six) hours as needed for mild pain.   Yes [provider]  levofloxacin (LEVAQUIN) 500 MG tablet Take 500 mg by mouth daily. 10/20/20  Yes [provider]    Past Medical History: Past Medical History:  Diagnosis Date  . Morbid obesity ([place])     Past Surgical History: History reviewed. No pertinent surgical history.  Family History: Family History  Problem Relation Age of Onset  . Heart failure Father        dx'ed with cardiomyoopathy in his late 26's/40's, and ultimately required LVAD    Social History: Social History   Socioeconomic History  . Marital status: Married    Spouse name: Not on file  . Number of children: Not on file  . Years of education: Not on file  . Highest education level: Not on file  Occupational History  . Not on file  Tobacco Use  . Smoking status: Never Smoker  . Smokeless tobacco: Never Used  Substance and Sexual Activity  . Alcohol use: Not Currently  . Drug use: Never  . Sexual activity: Not on file  Other Topics Concern  . Not on file  Social History Narrative  . Not on file   Social Determinants of Health   Financial Resource  Strain: Not on file  Food Insecurity: Not on file  Transportation Needs: Not on file  Physical Activity: Not on file  Stress: Not on file  Social Connections: Not on file    Allergies:  Allergies  Allergen Reactions  . Benadryl [Diphenhydramine] Other (See Comments)    Pass out    Objective:    Vital Signs:   Temp:  [98 F (36.7 C)-98.8 F (37.1 C)] 98.7 F (37.1 C) (05/31 0939) Pulse Rate:  [55-130] 96 (05/31 0939) Resp:  [18-20] 18 (05/31 0939) BP: (92-117)/(60-77) 99/70 (05/31 0939) SpO2:  [94 %-96 %] 96 % (05/31 0939) Last BM Date: 10/24/20  Weight change: Filed Weights   10/22/20 1539  Weight: 122.5 kg    Intake/Output:   Intake/Output Summary (Last 24 hours)  at 10/24/2020 1218 Last data filed at 10/24/2020 0940 Gross per 24 hour  Intake 1556.87 ml  Output 0 ml  Net 1556.87 ml      Physical Exam    General:  Well appearing. No resp difficulty HEENT: normal Neck: supple. Thick neck  JVP 8 cm. Carotids 2+ bilat; no bruits. No lymphadenopathy or thyromegaly appreciated. Cor: PMI nondisplaced. Regular rate & rhythm. No rubs, gallops or murmurs. Lungs: clear Abdomen: soft, nontender, nondistended. No hepatosplenomegaly. No bruits or masses. Good bowel sounds. Extremities: no cyanosis, clubbing, rash, edema Neuro: alert & orientedx3, cranial nerves grossly intact. moves all 4 extremities w/o difficulty. Affect pleasant   Telemetry   SR w/ high burden PVCs, > 50/hr   EKG    SR w/ PVCs Ventricular bigeminy   Labs   Basic Metabolic Panel: Recent Labs  Lab 10/22/20 1625 10/22/20 2317 10/23/20 0148 10/24/20 0253  NA 135  --  136 138  K 4.0  --  3.9 3.9  CL 105  --  102 105  CO2 20*  --  25 24  GLUCOSE 96  --  123* 95  BUN 11  --  10 6  CREATININE 0.99  --  1.04 0.88  CALCIUM 8.0*  --  8.6* 8.1*  MG  --  1.8 1.9 1.7  PHOS  --   --  3.0  --     Liver Function Tests: Recent Labs  Lab 10/23/20 0148  AST 30  ALT 55*  ALKPHOS 53  BILITOT  1.2  PROT 5.9*  ALBUMIN 2.9*   No results for input(s): LIPASE, AMYLASE in the last 168 hours. No results for input(s): AMMONIA in the last 168 hours.  CBC: Recent Labs  Lab 10/22/20 1625 10/23/20 0148 10/24/20 0253  WBC 15.1* 15.2* 12.8*  NEUTROABS 12.8* 11.7*  --   HGB 13.8 13.3 14.1  HCT 42.2 41.2 43.7  MCV 83.4 85.3 84.7  PLT 312 282 324    Cardiac Enzymes: No results for input(s): CKTOTAL, CKMB, CKMBINDEX, TROPONINI in the last 168 hours.  BNP: BNP (last 3 results) Recent Labs    10/22/20 1625  BNP 183.1*    ProBNP (last 3 results) No results for input(s): PROBNP in the last 8760 hours.   CBG: No results for input(s): GLUCAP in the last 168 hours.  Coagulation Studies: No results for input(s): LABPROT, INR in the last 72 hours.   Imaging    No results found.   Medications:     Current Medications: . dextromethorphan-guaiFENesin  1 tablet Oral BID  . enoxaparin (LOVENOX) injection  60 mg Subcutaneous Q24H     Infusions: . ceFEPime (MAXIPIME) IV Stopped (10/24/20 3818)      Assessment/Plan   1. Cardiomyopathy/ Acute Systolic Heart Failure ([place]) - Echo LVEF 20-25% + GIIDD, No LVH. RV normal. 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 Viral CM. Recent PNA  - TSH normal and HIV NR  - Doubt ischemic CM given lack of anginal symptoms and minimal risk factors for CAD. HS trop not c/w ACS. However will need LHC to fully exclude - Plan [facility] tomorrow - cMRI tomorrow  - needs PVC suppression (see below)  - will need outpatient sleep study to w/o OSA  - genetic testing (can be done as outpatient)  - Start  GDMT as BP allows (SBPs currently soft upper 90s-low 100s) - Start Spiro 12.5 daily  - Trial Losartan 12.5 qhs. BP too low for Entresto - SGLT2i prior to d/c (check Hgb  A1c) - LifeVest at d/c given high PVC burden   2. PVCs - high burden, > 50/hr on tele  - TSH normal, K 3.9, Mg 1.7 - Supp Mg and K, keep Mg > 2.0 and K >4.0  - start amio while inpatient to help w/ suppression, however not ideal for long-term given young age. May need mexilitine  - LHC to r/o ischemia  - given h/o snoring, high concern for OSA. Will need outpatient sleep study  - discussed avoidance of triggers. Needs to reduce ETOH consumption  - EP evaluation for PVC ablation   3. CAP - abx per IM  - Blood Cx NGTD     Medication concerns reviewed with patient and pharmacy team. Barriers identified: Not insured. Will ask SW to visit to help secure insurance. Will also notify HF pharmacy team to help w/ med cost assistance.   Length of Stay: 2  Lyda Jester, PA-C  10/24/2020, 12:18 PM  Advanced Heart Failure Team Pager [phone] (M-F; 7a - 5p)  Please contact [facility] for night-coverage after hours (4p -7a ) and weekends on [email or web address]  Patient seen with PA, agree with the above note.   Patient does not have significant PMH.  He was admitted with 2-3 weeks of dyspnea, not improved with course of abx.  CTA chest showed no PE but did show multifocal infiltrates concerning for PNA versus asymmetric edema.  He was initially started on abx but PCT was low and no fever, so stopped.  Echo was done, showing EF 20-25%, no LVH, mild-moderate MR, normal RV.  No ETOH, drugs.  HIV negative.  Of note, very frequent PVCs. Father with ?nonischemic cardiomyopathy, had LVAD placed in his 30s/40s.   General: NAD Neck: Thick. JVP 12-14 cm, no thyromegaly or thyroid nodule.  Lungs: Clear to auscultation bilaterally with normal respiratory effort. CV: Nondisplaced PMI.  Heart regular S1/S2 with PVCs, no S3/S4, no murmur.  No peripheral edema.  No carotid bruit.  Normal pedal pulses.  Abdomen: Soft, nontender, no hepatosplenomegaly, no distention.  Skin: Intact without lesions or rashes.   Neurologic: Alert and oriented x 3.  Psych: Normal affect. Extremities: No clubbing or cyanosis.  HEENT: Normal.   Newly found cardiomyopathy in patient with minimal past history, but father had LVAD placed for presumed NICM in his 30s/40s (has passed away).  Patient also noted to have frequent PVCs.  Possible causes of CMP include PVC-mediated, viral myocarditis (URI-type symptoms recently), or familial CMP.  Cannot fully rule out CAD.  He is volume overloaded on exam.  - Start Lasix 40 mg IV bid, replace K and Mg.  - SBP soft, start losartan 12.5 mg daily (no BP room yet for Entresto) and spironolactone 12.5 daily.  - Wilder Glade would be next addition.  - Plan RHC/LHC tomorrow, cardiac MRI for infiltrative disease/myocarditis if no CAD.   - Start mexiletine for frequent PVCs (would try to avoid amiodarone given young age).  May need evaluation by EP eventually.  Consider Lifevest with very frequent PVCs (will assess response to mexiletine).  - Genetic testing for cardiomyopathy as outpatient given family history.  - Supect OSA, outpatient sleep study.   Loralie Champagne 10/24/2020 5:17 PM

## 2020-10-24 NOTE — Progress Notes (Signed)
PROGRESS NOTE  David Vang TWK:[id number] DOB: [birth year]   PCP: Pcp, No  Patient is from: Home.  DOA: 10/22/2020 LOS: 2  Chief complaints:  Chief Complaint  Patient presents with  . Shortness of Breath    Brief Narrative / Interim history: 30 year old M with PMH of morbid obesity and "recurrent pneumonia" presented to ED with shortness of breath and admitted with concern for "sepsis due to bilateral pneumonia". Reportedly, he was treated outpatient with oral antibiotic and steroids 2 weeks prior to presentation.   Patient had mild temp to 99.6 and leukocytosis to 15.2.  Lactic acid was within normal.  BNP, troponin and D-dimer were marginally elevated.  EKG with ventricular bigeminy.  CTA chest negative for PE but extensive airspace lung opacities, more evident on the right, with borderline cardiomegaly and a trace right pleural effusion concerning for asymmetric pulmonary edema or multifocal pneumonia.  COVID-19 and influenza PCR were negative.  His procalcitonin was negative.  He was started on broad-spectrum antibiotics and admitted for community-acquired pneumonia.   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  Repeat procalcitonin remain negative.  Antibiotics discontinued.  Cardiology consulted.  Upon further questioning, patient has had DOE and PND for the last 2 weeks but no edema. He also reports family history of heart failure in his father in his 59s, and passed away in his 64s.  Patient smoked in remote past.  Denies alcohol or recreational drug use.  TSH is within normal.  Subjective: Seen and examined earlier this morning.  Reports improvement in his breathing.  Still with some cough with whitish phlegm.  He denies hemoptysis.  He denies chest pain  Objective: Vitals:   10/23/20 1955 10/24/20 0540 10/24/20 0934 10/24/20 0939  BP: 117/73 102/60 92/70 99/70   Pulse: (!) 56 (!) 55 (!) 130 96  Resp: 20 18 18 18    Temp: 98.7 F (37.1 C) 98 F (36.7 C) 98.7 F (37.1 C) 98.7 F (37.1 C)  TempSrc: Oral Oral Oral Oral  SpO2: 96% 95% 96% 96%  Weight:      Height:        Intake/Output Summary (Last 24 hours) at 10/24/2020 1411 Last data filed at 10/24/2020 0940 Gross per 24 hour  Intake 1556.87 ml  Output 0 ml  Net 1556.87 ml   Filed Weights   10/22/20 1539  Weight: 122.5 kg    Examination:  GENERAL: No apparent distress.  Nontoxic. HEENT: MMM.  Vision and hearing grossly intact.  NECK: Supple.  No apparent JVD.  RESP:  No IWOB.  Fair aeration bilaterally. CVS:  RRR.  2/6 SEM over LLSB.  Galloping? ABD/GI/GU: BS+. Abd soft, NTND.  MSK/EXT:  Moves extremities. No apparent deformity. No edema.  SKIN: no apparent skin lesion or wound NEURO: Awake, alert and oriented appropriately.  No apparent focal neuro deficit. PSYCH: Calm. Normal affect.  Procedures:  None  Microbiology summarized: COVID-19 PCR nonreactive. MRSA PCR screen negative. Blood cultures NGTD. C. difficile pending.  Assessment & Plan: Acute combined CHF/cardiomyopathy: Likely cause of his respiratory symptoms refractory to antibiotics.  He could have viral respiratory infections.  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  Significant family history.  His TSH and HIV was within normal.  EKG with ventricular bigeminy.  He had DOE and PND over the last 2 weeks.  Appears euvolemic on exam. -Cardiology consulted  -plan for [facility] and cMRI on 6/1  -  Outpatient sleep study to rule out OSA  -Outpatient genetic testing  -GDMT-Aldactone, losartan, SGLT2 inhibitors  -LifeVest at DC given his PVC burden's  -Check risk stratification labs -Monitor fluid status and renal functions -Sodium and fluid restrictions -Check inflammatory markers  PVCs-high burden. >  50/on telemetry.  TSH within normal. -Cardiology started Mexitil -Optimize Mg and K -See above for more  Community-acquired pneumonia-ruled out.  His  leukocytosis is likely from recent steroid.  He is procalcitonin remained negative.  Cultures negative.  He could have viral infection but he denies URI symptoms either. -Discontinue antibiotics  Diarrhea?-Reportedly 3 loose stools in the last 24 hours.  Abdominal exam benign.  Leukocytosis improving. -C. difficile testing given recent antibiotics  Leukocytosis/bandemia: Likely demargination from recent steroid versus bacterial infection.  Improved. -Monitor  Morbid obesity Body mass index is 42.29 kg/m.  -Encourage lifestyle change to lose weight.         DVT prophylaxis:  On subcu Lovenox  Code Status: Full code Family Communication: Patient and/or RN. Available if any question.  Level of care: Telemetry Medical Status is: Inpatient  Remains inpatient appropriate because:Hemodynamically unstable, Persistent severe electrolyte disturbances, Ongoing diagnostic testing needed not appropriate for outpatient work up, IV treatments appropriate due to intensity of illness or inability to take PO and Inpatient level of care appropriate due to severity of illness   Dispo: The patient is from: Home              Anticipated d/c is to: Home              Patient currently is not medically stable to d/c.   Difficult to place patient No       Consultants:  Advanced heart failure   Sch Meds:  Scheduled Meds: . dextromethorphan-guaiFENesin  1 tablet Oral BID  . enoxaparin (LOVENOX) injection  60 mg Subcutaneous Q24H  . losartan  12.5 mg Oral QHS  . mexiletine  150 mg Oral Q8H  . spironolactone  12.5 mg Oral Daily   Continuous Infusions: . magnesium sulfate bolus IVPB     PRN Meds:.acetaminophen **OR** acetaminophen  Antimicrobials: Anti-infectives (From admission, onward)   Start     Dose/Rate Route Frequency Ordered Stop   10/23/20 0600  ceFEPIme (MAXIPIME) 2 g in sodium chloride 0.9 % 100 mL IVPB  Status:  Discontinued        2 g 200 mL/hr over 30 Minutes Intravenous  Every 8 hours 10/22/20 2257 10/24/20 1250   10/23/20 0600  vancomycin (VANCOREADY) IVPB 1750 mg/350 mL  Status:  Discontinued        1,750 mg 175 mL/hr over 120 Minutes Intravenous Every 8 hours 10/22/20 2259 10/23/20 0922   10/22/20 2345  vancomycin (VANCOREADY) IVPB 2000 mg/400 mL        2,000 mg 200 mL/hr over 120 Minutes Intravenous  Once 10/22/20 2249 10/23/20 0316   10/22/20 2345  ceFEPIme (MAXIPIME) 2 g in sodium chloride 0.9 % 100 mL IVPB        2 g 200 mL/hr over 30 Minutes Intravenous  Once 10/22/20 2249 10/23/20 0039   10/22/20 1700  cefTRIAXone (ROCEPHIN) 2 g in sodium chloride 0.9 % 100 mL IVPB        2 g 200 mL/hr over 30 Minutes Intravenous  Once 10/22/20 1654 10/22/20 1803   10/22/20 1700  doxycycline (VIBRA-TABS) tablet 100 mg        100 mg Oral  Once 10/22/20 1654 10/22/20 1721  I have personally reviewed the following labs and images: CBC: Recent Labs  Lab 10/22/20 1625 10/23/20 0148 10/24/20 0253  WBC 15.1* 15.2* 12.8*  NEUTROABS 12.8* 11.7*  --   HGB 13.8 13.3 14.1  HCT 42.2 41.2 43.7  MCV 83.4 85.3 84.7  PLT 312 282 324   BMP &GFR Recent Labs  Lab 10/22/20 1625 10/22/20 2317 10/23/20 0148 10/24/20 0253  NA 135  --  136 138  K 4.0  --  3.9 3.9  CL 105  --  102 105  CO2 20*  --  25 24  GLUCOSE 96  --  123* 95  BUN 11  --  10 6  CREATININE 0.99  --  1.04 0.88  CALCIUM 8.0*  --  8.6* 8.1*  MG  --  1.8 1.9 1.7  PHOS  --   --  3.0  --    Estimated Creatinine Clearance: 155.4 mL/min (by C-G formula based on SCr of 0.88 mg/dL). Liver & Pancreas: Recent Labs  Lab 10/23/20 0148  AST 30  ALT 55*  ALKPHOS 53  BILITOT 1.2  PROT 5.9*  ALBUMIN 2.9*   No results for input(s): LIPASE, AMYLASE in the last 168 hours. No results for input(s): AMMONIA in the last 168 hours. Diabetic: No results for input(s): HGBA1C in the last 72 hours. No results for input(s): GLUCAP in the last 168 hours. Cardiac Enzymes: No results for input(s): CKTOTAL,  CKMB, CKMBINDEX, TROPONINI in the last 168 hours. No results for input(s): PROBNP in the last 8760 hours. Coagulation Profile: No results for input(s): INR, PROTIME in the last 168 hours. Thyroid Function Tests: Recent Labs    10/23/20 0503  TSH 1.775   Lipid Profile: No results for input(s): CHOL, HDL, LDLCALC, TRIG, CHOLHDL, LDLDIRECT in the last 72 hours. Anemia Panel: No results for input(s): VITAMINB12, FOLATE, FERRITIN, TIBC, IRON, RETICCTPCT in the last 72 hours. Urine analysis:    Component Value Date/Time   COLORURINE YELLOW 10/22/2020 0229   APPEARANCEUR CLEAR 10/22/2020 0229   LABSPEC 1.013 10/22/2020 0229   PHURINE 5.0 10/22/2020 0229   GLUCOSEU NEGATIVE 10/22/2020 0229   HGBUR NEGATIVE 10/22/2020 0229   BILIRUBINUR NEGATIVE 10/22/2020 0229   KETONESUR 5 (A) 10/22/2020 0229   PROTEINUR NEGATIVE 10/22/2020 0229   NITRITE NEGATIVE 10/22/2020 0229   LEUKOCYTESUR NEGATIVE 10/22/2020 0229   Sepsis Labs: Invalid input(s): PROCALCITONIN, LACTICIDVEN  Microbiology: Recent Results (from the past 240 hour(s))  Resp Panel by RT-PCR (Flu A&B, Covid) Nasopharyngeal Swab     Status: None   Collection Time: 10/22/20  4:25 PM   Specimen: Nasopharyngeal Swab; Nasopharyngeal(NP) swabs in vial transport medium  Result Value Ref Range Status   SARS Coronavirus 2 by RT PCR NEGATIVE NEGATIVE Final    Comment: (NOTE) SARS-CoV-2 target nucleic acids are NOT DETECTED.  The SARS-CoV-2 RNA is generally detectable in upper respiratory specimens during the acute phase of infection. The lowest concentration of SARS-CoV-2 viral copies this assay can detect is 138 copies/mL. A negative result does not preclude SARS-Cov-2 infection and should not be used as the sole basis for treatment or other patient management decisions. A negative result may occur with  improper specimen collection/handling, submission of specimen other than nasopharyngeal swab, presence of viral mutation(s) within  the areas targeted by this assay, and inadequate number of viral copies(<138 copies/mL). A negative result must be combined with clinical observations, patient history, and epidemiological information. The expected result is Negative.  Fact Sheet for Patients:  BloggerCourse.com  Fact Sheet for Healthcare Providers:  SeriousBroker.it  This test is no t yet approved or cleared by the [place] FDA and  has been authorized for detection and/or diagnosis of SARS-CoV-2 by FDA under an Emergency Use Authorization (EUA). This EUA will remain  in effect (meaning this test can be used) for the duration of the COVID-19 declaration under Section 564(b)(1) of the Act, 21 U.S.C.section 360bbb-3(b)(1), unless the authorization is terminated  or revoked sooner.       Influenza A by PCR NEGATIVE NEGATIVE Final   Influenza B by PCR NEGATIVE NEGATIVE Final    Comment: (NOTE) The Xpert Xpress SARS-CoV-2/FLU/RSV plus assay is intended as an aid in the diagnosis of influenza from Nasopharyngeal swab specimens and should not be used as a sole basis for treatment. Nasal washings and aspirates are unacceptable for Xpert Xpress SARS-CoV-2/FLU/RSV testing.  Fact Sheet for Patients: BloggerCourse.com  Fact Sheet for Healthcare Providers: SeriousBroker.it  This test is not yet approved or cleared by the [place] FDA and has been authorized for detection and/or diagnosis of SARS-CoV-2 by FDA under an Emergency Use Authorization (EUA). This EUA will remain in effect (meaning this test can be used) for the duration of the COVID-19 declaration under Section 564(b)(1) of the Act, 21 U.S.C. section 360bbb-3(b)(1), unless the authorization is terminated or revoked.  Performed at [facility], [address] [place], [place] [address]   Culture, blood (Routine X 2) w Reflex to  ID Panel     Status: None (Preliminary result)   Collection Time: 10/22/20 11:17 PM   Specimen: BLOOD  Result Value Ref Range Status   Specimen Description BLOOD LEFT ANTECUBITAL  Final   Special Requests   Final    BOTTLES DRAWN AEROBIC ONLY Blood Culture results may not be optimal due to an inadequate volume of blood received in culture bottles   Culture   Final    NO GROWTH 2 DAYS Performed at [facility], [address] [place], [place] [address]    Report Status PENDING  Incomplete  Culture, blood (Routine X 2) w Reflex to ID Panel     Status: None (Preliminary result)   Collection Time: 10/22/20 11:27 PM   Specimen: BLOOD  Result Value Ref Range Status   Specimen Description BLOOD LEFT ANTECUBITAL  Final   Special Requests   Final    BOTTLES DRAWN AEROBIC ONLY Blood Culture results may not be optimal due to an inadequate volume of blood received in culture bottles   Culture   Final    NO GROWTH 2 DAYS Performed at [facility], [address], [place], [place] [address]    Report Status PENDING  Incomplete  MRSA PCR Screening     Status: None   Collection Time: 10/23/20  1:57 AM   Specimen: Nasal Mucosa; Nasopharyngeal  Result Value Ref Range Status   MRSA by PCR NEGATIVE NEGATIVE Final    Comment:        The GeneXpert MRSA Assay (FDA approved for NASAL specimens only), is one component of a comprehensive MRSA colonization surveillance program. It is not intended to diagnose MRSA infection nor to guide or monitor treatment for MRSA infections. Performed at [facility], [address] [place], [place] [address]   Expectorated Sputum Assessment w Gram Stain, Rflx to Resp Cult     Status: None   Collection Time: 10/23/20  5:11 AM   Specimen: Sputum  Result Value Ref Range  Status   Specimen Description SPUTUM  Final   Special Requests NONE  Final   Sputum evaluation   Final    Sputum specimen not acceptable for testing.  Please recollect.    RESULT CALLED TO, READ BACK BY AND VERIFIED WITH: RN [email or web address] ON [id number] AT 1424 BY E.PARRISH Performed at [facility] [place] [facility], [address] [place], [place] [address]    Report Status 10/23/2020 FINAL  Final    Radiology Studies: No results found.    Nara Paternoster T. Deakon Frix [facility]  If 7PM-7AM, please contact night-coverage [email or web address] 10/24/2020, 2:11 PM

## 2020-10-24 NOTE — Progress Notes (Addendum)
Advanced Heart Failure Team Consult Note   Primary Physician: Pcp, No PCP-Cardiologist:  None  Reason for Consultation: Newly Diagnosed acute systolic heart failure   HPI:    David Vang is seen today for evaluation of newly diagnosed acute systolic heart failure at the request of Dr. Cyndia Skeeters, Internal Medicine.   30 y/o male w/ morbid obesity and family h/o cardiomyopathy. His father was diagnosed w/ CM in his late 30s/ early 2s, ultimately requiring an LVAD. His father died in his 22s from brain hemorrhage.   Pt w/ recent respiratory symptoms, including SOB and productive cough. Was diagnosed w/ CAP ~2 weeks ago and prescribed a course of abx + prednisone w/o improved symptoms. Subsequently, went to [place] on 5/29 w/ worsening symptoms + subjective fever.  In the ED, he met early sepsis criteria and CXR and CT concerning for bilateral PNA + CHF. No PE on CT. WBC 15K, mTemp 99.6, tachycardic but normotensive. EKG showed ventricular bigeminy. COVID and Flu negative. Bicarb 20. Lactic acid only 1.4 BNP 183.  He was admitted and transferred to [facility] for further care. Blood cultures obtained and he was first placed on Vanc + cefepime. Also started on IV Lasix for CHF.   Echo completed showing severely reduced LVEF, 20-25% w/ global HK. GIIDM, no LVH, mild-mod MR. AoV normal. RV normal.   BPs have not been elevated. Tele shows high burden PVCs > 50/hr. TSH WNL. He reports high caffeine intake. Drinks [employer] daily + caffeenated soft drinks. Also h/o snoring but has never had a sleep study. He has been fully vaccinated for COVID and boosted. Other than recent PNA, no other viral illnesses's in recent months. Denies elicit drug use. No tobacco or ETOH. Denies any h/o ischemic like CP.   Overall, he is feeling much better after getting IV Lasix. SBPs soft, upper 90s-low 100s. Scr 0.88. K 3.9. Mg low at 1.7. He had 3 watery stools earlier today. C-diff test pending given recent  abx.   AHF team consulted to assist w/ further w/u and management of systolic HF.     Pertient Labs/Test  UDS negative  PCT <0.10  WBC 15K Hgb 13.8  Na 135  K 4.0 CO2 20>>25 SCr 0.99>>1.04 AST 30 ALT 55  Albumin 2.9  Lactic Acid 1.4   HS Trop 47>>46   TSH 1.775  HIV NR   BCx NGTD Sputum specimen not acceptable for testing. Needs recollection   2D Echo 10/23/20 1. Left ventricular ejection fraction, by estimation, is 20 to 25%. The left ventricle has severely decreased function. The left ventricle demonstrates global hypokinesis. The left ventricular internal cavity size was moderately dilated. Left ventricular diastolic parameters are consistent with Grade II diastolic dysfunction (pseudonormalization). Elevated left atrial pressure. 2. Right ventricular systolic function is normal. The right ventricular size is normal. 3. The mitral valve is normal in structure. Mild to moderate mitral valve regurgitation. No evidence of mitral stenosis. 4. The aortic valve is normal in structure. Aortic valve regurgitation is not visualized. No aortic stenosis is present. 5. The inferior vena cava is normal in size with greater than 50% respiratory variability, suggesting right atrial pressure of 3 mmHg.  Review of Systems: [y] = yes, _0  = no   . General: Weight gain _1 ; Weight loss _2 ; Anorexia _3 ; Fatigue _4 ; Fever [Y ]; Chills _5 ; Weakness _6   . Cardiac: Chest pain/pressure _7 ; Resting SOB [ Y]; Exertional SOB [  Y]; Orthopnea _0 ; Pedal Edema _1 ; Palpitations [Y ]; Syncope _2 ; Presyncope _3 ; Paroxysmal nocturnal dyspnea_4   . Pulmonary: Cough [Y ]; Wheezing_5 ; Hemoptysis_6 ; Sputum _7 ; Snoring _8   . GI: Vomiting_9 ; Dysphagia_10 ; Melena_11 ; Hematochezia _12 ; Heartburn_13 ; Abdominal pain _14 ; Constipation _15 ; Diarrhea _16 ; BRBPR _17   . GU: Hematuria_18 ; Dysuria _19 ; Nocturia_20   . Vascular: Pain in legs with walking _21 ; Pain in feet with lying flat _22 ; Non-healing  sores _23 ; Stroke _24 ; TIA _25 ; Slurred speech _26 ;  . Neuro: Headaches_27 ; Vertigo_28 ; Seizures_29 ; Paresthesias_30 ;Blurred vision _31 ; Diplopia _32 ; Vision changes _33   . Ortho/Skin: Arthritis _34 ; Joint pain _35 ; Muscle pain _36 ; Joint swelling _37 ; Back Pain _38 ; Rash _39   . Psych: Depression_40 ; Anxiety_41   . Heme: Bleeding problems _42 ; Clotting disorders _43 ; Anemia _44   . Endocrine: Diabetes _45 ; Thyroid dysfunction_46   Home Medications Prior to Admission medications   Medication Sig Start Date End Date Taking? Authorizing Provider  albuterol (VENTOLIN HFA) 108 (90 Base) MCG/ACT inhaler Inhale 1-2 puffs into the lungs every 4 (four) hours as needed for wheezing or shortness of breath. 10/19/20  Yes [provider]  ibuprofen (ADVIL) 200 MG tablet Take 400 mg by mouth every 6 (six) hours as needed for mild pain.   Yes [provider]  levofloxacin (LEVAQUIN) 500 MG tablet Take 500 mg by mouth daily. 10/20/20  Yes [provider]    Past Medical History: Past Medical History:  Diagnosis Date  . Morbid obesity ([place])     Past Surgical History: History reviewed. No pertinent surgical history.  Family History: Family History  Problem Relation Age of Onset  . Heart failure Father        dx'ed with cardiomyoopathy in his late 26's/40's, and ultimately required LVAD    Social History: Social History   Socioeconomic History  . Marital status: Married    Spouse name: Not on file  . Number of children: Not on file  . Years of education: Not on file  . Highest education level: Not on file  Occupational History  . Not on file  Tobacco Use  . Smoking status: Never Smoker  . Smokeless tobacco: Never Used  Substance and Sexual Activity  . Alcohol use: Not Currently  . Drug use: Never  . Sexual activity: Not on file  Other Topics Concern  . Not on file  Social History Narrative  . Not on file   Social Determinants of Health   Financial Resource  Strain: Not on file  Food Insecurity: Not on file  Transportation Needs: Not on file  Physical Activity: Not on file  Stress: Not on file  Social Connections: Not on file    Allergies:  Allergies  Allergen Reactions  . Benadryl [Diphenhydramine] Other (See Comments)    Pass out    Objective:    Vital Signs:   Temp:  [98 F (36.7 C)-98.8 F (37.1 C)] 98.7 F (37.1 C) (05/31 0939) Pulse Rate:  [55-130] 96 (05/31 0939) Resp:  [18-20] 18 (05/31 0939) BP: (92-117)/(60-77) 99/70 (05/31 0939) SpO2:  [94 %-96 %] 96 % (05/31 0939) Last BM Date: 10/24/20  Weight change: Filed Weights   10/22/20 1539  Weight: 122.5 kg    Intake/Output:   Intake/Output Summary (Last 24 hours)  at 10/24/2020 1218 Last data filed at 10/24/2020 0940 Gross per 24 hour  Intake 1556.87 ml  Output 0 ml  Net 1556.87 ml      Physical Exam    General:  Well appearing. No resp difficulty HEENT: normal Neck: supple. Thick neck  JVP 8 cm. Carotids 2+ bilat; no bruits. No lymphadenopathy or thyromegaly appreciated. Cor: PMI nondisplaced. Regular rate & rhythm. No rubs, gallops or murmurs. Lungs: clear Abdomen: soft, nontender, nondistended. No hepatosplenomegaly. No bruits or masses. Good bowel sounds. Extremities: no cyanosis, clubbing, rash, edema Neuro: alert & orientedx3, cranial nerves grossly intact. moves all 4 extremities w/o difficulty. Affect pleasant   Telemetry   SR w/ high burden PVCs, > 50/hr   EKG    SR w/ PVCs Ventricular bigeminy   Labs   Basic Metabolic Panel: Recent Labs  Lab 10/22/20 1625 10/22/20 2317 10/23/20 0148 10/24/20 0253  NA 135  --  136 138  K 4.0  --  3.9 3.9  CL 105  --  102 105  CO2 20*  --  25 24  GLUCOSE 96  --  123* 95  BUN 11  --  10 6  CREATININE 0.99  --  1.04 0.88  CALCIUM 8.0*  --  8.6* 8.1*  MG  --  1.8 1.9 1.7  PHOS  --   --  3.0  --     Liver Function Tests: Recent Labs  Lab 10/23/20 0148  AST 30  ALT 55*  ALKPHOS 53  BILITOT  1.2  PROT 5.9*  ALBUMIN 2.9*   No results for input(s): LIPASE, AMYLASE in the last 168 hours. No results for input(s): AMMONIA in the last 168 hours.  CBC: Recent Labs  Lab 10/22/20 1625 10/23/20 0148 10/24/20 0253  WBC 15.1* 15.2* 12.8*  NEUTROABS 12.8* 11.7*  --   HGB 13.8 13.3 14.1  HCT 42.2 41.2 43.7  MCV 83.4 85.3 84.7  PLT 312 282 324    Cardiac Enzymes: No results for input(s): CKTOTAL, CKMB, CKMBINDEX, TROPONINI in the last 168 hours.  BNP: BNP (last 3 results) Recent Labs    10/22/20 1625  BNP 183.1*    ProBNP (last 3 results) No results for input(s): PROBNP in the last 8760 hours.   CBG: No results for input(s): GLUCAP in the last 168 hours.  Coagulation Studies: No results for input(s): LABPROT, INR in the last 72 hours.   Imaging    No results found.   Medications:     Current Medications: . dextromethorphan-guaiFENesin  1 tablet Oral BID  . enoxaparin (LOVENOX) injection  60 mg Subcutaneous Q24H     Infusions: . ceFEPime (MAXIPIME) IV Stopped (10/24/20 0613)      Assessment/Plan   1. Cardiomyopathy/ Acute Systolic Heart Failure (New) - Echo LVEF 20-25% + GIIDD, No LVH. RV normal. 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 Viral CM. Recent PNA  - TSH normal and HIV NR  - Doubt ischemic CM given lack of anginal symptoms and minimal risk factors for CAD. HS trop not c/w ACS. However will need LHC to fully exclude - Plan R/LHC tomorrow - cMRI tomorrow  - needs PVC suppression (see below)  - will need outpatient sleep study to w/o OSA  - genetic testing (can be done as outpatient)  - Start   GDMT as BP allows (SBPs currently soft upper 90s-low 100s) - Start Spiro 12.5 daily  - Trial Losartan 12.5 qhs. BP too low for Entresto - SGLT2i prior to d/c (check Hgb  A1c) - LifeVest at d/c given high PVC burden   2. PVCs - high burden, > 50/hr on tele  - TSH normal, K 3.9, Mg 1.7 - Supp Mg and K, keep Mg > 2.0 and K >4.0  - start amio while inpatient to help w/ suppression, however not ideal for long-term given young age. May need mexilitine  - LHC to r/o ischemia  - given h/o snoring, high concern for OSA. Will need outpatient sleep study  - discussed avoidance of triggers. Needs to reduce ETOH consumption  - EP evaluation for PVC ablation   3. CAP - abx per IM  - Blood Cx NGTD     Medication concerns reviewed with patient and pharmacy team. Barriers identified: Not insured. Will ask SW to visit to help secure insurance. Will also notify HF pharmacy team to help w/ med cost assistance.   Length of Stay: 2  Lyda Jester, PA-C  10/24/2020, 12:18 PM  Advanced Heart Failure Team Pager [phone] (M-F; 7a - 5p)  Please contact [facility] for night-coverage after hours (4p -7a ) and weekends on [email or web address]  Patient seen with PA, agree with the above note.   Patient does not have significant PMH.  He was admitted with 2-3 weeks of dyspnea, not improved with course of abx.  CTA chest showed no PE but did show multifocal infiltrates concerning for PNA versus asymmetric edema.  He was initially started on abx but PCT was low and no fever, so stopped.  Echo was done, showing EF 20-25%, no LVH, mild-moderate MR, normal RV.  No ETOH, drugs.  HIV negative.  Of note, very frequent PVCs. Father with ?nonischemic cardiomyopathy, had LVAD placed in his 30s/40s.   General: NAD Neck: Thick. JVP 12-14 cm, no thyromegaly or thyroid nodule.  Lungs: Clear to auscultation bilaterally with normal respiratory effort. CV: Nondisplaced PMI.  Heart regular S1/S2 with PVCs, no S3/S4, no murmur.  No peripheral edema.  No carotid bruit.  Normal pedal pulses.  Abdomen: Soft, nontender, no hepatosplenomegaly, no distention.  Skin: Intact without lesions or rashes.   Neurologic: Alert and oriented x 3.  Psych: Normal affect. Extremities: No clubbing or cyanosis.  HEENT: Normal.   Newly found cardiomyopathy in patient with minimal past history, but father had LVAD placed for presumed NICM in his 30s/40s (has passed away).  Patient also noted to have frequent PVCs.  Possible causes of CMP include PVC-mediated, viral myocarditis (URI-type symptoms recently), or familial CMP.  Cannot fully rule out CAD.  He is volume overloaded on exam.  - Start Lasix 40 mg IV bid, replace K and Mg.  - SBP soft, start losartan 12.5 mg daily (no BP room yet for Entresto) and spironolactone 12.5 daily.  - Wilder Glade would be next addition.  - Plan RHC/LHC tomorrow, cardiac MRI for infiltrative disease/myocarditis if no CAD.   - Start mexiletine for frequent PVCs (would try to avoid amiodarone given young age).  May need evaluation by EP eventually.  Consider Lifevest with very frequent PVCs (will assess response to mexiletine).  - Genetic testing for cardiomyopathy as outpatient given family history.  - Supect OSA, outpatient sleep study.   Loralie Champagne 10/24/2020 5:17 PM

## 2020-10-25 ENCOUNTER — Other Ambulatory Visit (HOSPITAL_COMMUNITY): Payer: Self-pay

## 2020-10-25 ENCOUNTER — Encounter (HOSPITAL_COMMUNITY)
Admission: EM | Disposition: A | Discharge status: Home / Self Care | Payer: Self-pay | Source: Home / Self Care | Attending: Student

## 2020-10-25 ENCOUNTER — Encounter (HOSPITAL_COMMUNITY): Payer: Self-pay | Admitting: Cardiology

## 2020-10-25 ENCOUNTER — Inpatient Hospital Stay (HOSPITAL_COMMUNITY): Payer: Self-pay

## 2020-10-25 DIAGNOSIS — I429 Cardiomyopathy, unspecified: Secondary | ICD-10-CM

## 2020-10-25 DIAGNOSIS — I5021 Acute systolic (congestive) heart failure: Secondary | ICD-10-CM

## 2020-10-25 HISTORY — PX: RIGHT/LEFT HEART CATH AND CORONARY ANGIOGRAPHY: CATH118266

## 2020-10-25 LAB — POCT I-STAT EG7
Acid-Base Excess: 1 mmol/L (ref 0.0–2.0)
Acid-Base Excess: 1 mmol/L (ref 0.0–2.0)
Bicarbonate: 26 mmol/L (ref 20.0–28.0)
Bicarbonate: 26.1 mmol/L (ref 20.0–28.0)
Calcium, Ion: 1.13 mmol/L — ABNORMAL LOW (ref 1.15–1.40)
Calcium, Ion: 1.18 mmol/L (ref 1.15–1.40)
HCT: 38 % — ABNORMAL LOW (ref 39.0–52.0)
HCT: 38 % — ABNORMAL LOW (ref 39.0–52.0)
Hemoglobin: 12.9 g/dL — ABNORMAL LOW (ref 13.0–17.0)
Hemoglobin: 12.9 g/dL — ABNORMAL LOW (ref 13.0–17.0)
O2 Saturation: 61 %
O2 Saturation: 65 %
Potassium: 3.7 mmol/L (ref 3.5–5.1)
Potassium: 3.8 mmol/L (ref 3.5–5.1)
Sodium: 138 mmol/L (ref 135–145)
Sodium: 138 mmol/L (ref 135–145)
TCO2: 27 mmol/L (ref 22–32)
TCO2: 27 mmol/L (ref 22–32)
pCO2, Ven: 42.8 mmHg — ABNORMAL LOW (ref 44.0–60.0)
pCO2, Ven: 42.9 mmHg — ABNORMAL LOW (ref 44.0–60.0)
pH, Ven: 7.389 (ref 7.250–7.430)
pH, Ven: 7.393 (ref 7.250–7.430)
pO2, Ven: 32 mmHg (ref 32.0–45.0)
pO2, Ven: 34 mmHg (ref 32.0–45.0)

## 2020-10-25 LAB — LIPID PANEL
Cholesterol: 159 mg/dL (ref 0–200)
HDL: 30 mg/dL — ABNORMAL LOW (ref >40)
LDL Cholesterol: 115 mg/dL — ABNORMAL HIGH (ref 0–99)
Total CHOL/HDL Ratio: 5.3 RATIO
Triglycerides: 68 mg/dL (ref <150)
VLDL: 14 mg/dL (ref 0–40)

## 2020-10-25 LAB — COMPREHENSIVE METABOLIC PANEL
ALT: 40 U/L (ref 0–44)
AST: 16 U/L (ref 15–41)
Albumin: 2.9 g/dL — ABNORMAL LOW (ref 3.5–5.0)
Alkaline Phosphatase: 52 U/L (ref 38–126)
Anion gap: 10 (ref 5–15)
BUN: 13 mg/dL (ref 6–20)
CO2: 27 mmol/L (ref 22–32)
Calcium: 8.6 mg/dL — ABNORMAL LOW (ref 8.9–10.3)
Chloride: 98 mmol/L (ref 98–111)
Creatinine, Ser: 1.08 mg/dL (ref 0.61–1.24)
GFR, Estimated: >60 mL/min (ref >60)
Glucose, Bld: 105 mg/dL — ABNORMAL HIGH (ref 70–99)
Potassium: 4.4 mmol/L (ref 3.5–5.1)
Sodium: 135 mmol/L (ref 135–145)
Total Bilirubin: 0.9 mg/dL (ref 0.3–1.2)
Total Protein: 6.1 g/dL — ABNORMAL LOW (ref 6.5–8.1)

## 2020-10-25 LAB — BRAIN NATRIURETIC PEPTIDE: B Natriuretic Peptide: 315.9 pg/mL — ABNORMAL HIGH (ref 0.0–100.0)

## 2020-10-25 LAB — MAGNESIUM: Magnesium: 2.4 mg/dL (ref 1.7–2.4)

## 2020-10-25 LAB — PHOSPHORUS: Phosphorus: 4.1 mg/dL (ref 2.5–4.6)

## 2020-10-25 LAB — SURGICAL PCR SCREEN
MRSA, PCR: NEGATIVE
Staphylococcus aureus: NEGATIVE

## 2020-10-25 LAB — C-REACTIVE PROTEIN: CRP: 8.9 mg/dL — ABNORMAL HIGH (ref <1.0)

## 2020-10-25 LAB — SEDIMENTATION RATE: Sed Rate: 40 mm/hr — ABNORMAL HIGH (ref 0–16)

## 2020-10-25 SURGERY — RIGHT/LEFT HEART CATH AND CORONARY ANGIOGRAPHY
Anesthesia: LOCAL

## 2020-10-25 MED ORDER — HEPARIN (PORCINE) IN NACL 1000-0.9 UT/500ML-% IV SOLN
INTRAVENOUS | Status: AC
  Filled 2020-10-25: qty 1000

## 2020-10-25 MED ORDER — MIDAZOLAM HCL 2 MG/2ML IJ SOLN
INTRAMUSCULAR | Status: DC | PRN
  Administered 2020-10-25 (×2): 1 mg via INTRAVENOUS

## 2020-10-25 MED ORDER — DAPAGLIFLOZIN PROPANEDIOL 10 MG PO TABS
10.0000 mg | ORAL_TABLET | Freq: Every day | ORAL | Status: DC
  Administered 2020-10-25 – 2020-10-27 (×3): 10 mg via ORAL
  Filled 2020-10-25 (×3): qty 1

## 2020-10-25 MED ORDER — SODIUM CHLORIDE 0.9% FLUSH: 3.0000 mL | INTRAVENOUS | Status: DC | PRN

## 2020-10-25 MED ORDER — VERAPAMIL HCL 2.5 MG/ML IV SOLN
INTRAVENOUS | Status: DC | PRN
  Administered 2020-10-25: 10 mL via INTRA_ARTERIAL

## 2020-10-25 MED ORDER — HEPARIN SODIUM (PORCINE) 1000 UNIT/ML IJ SOLN
INTRAMUSCULAR | Status: AC
  Filled 2020-10-25: qty 1

## 2020-10-25 MED ORDER — POTASSIUM CHLORIDE CRYS ER 20 MEQ PO TBCR
20.0000 meq | EXTENDED_RELEASE_TABLET | Freq: Once | ORAL | Status: AC
  Administered 2020-10-25: 20 meq via ORAL
  Filled 2020-10-25: qty 1

## 2020-10-25 MED ORDER — LIDOCAINE HCL (PF) 1 % IJ SOLN
INTRAMUSCULAR | Status: DC | PRN
  Administered 2020-10-25 (×2): 2 mL

## 2020-10-25 MED ORDER — AMIODARONE HCL 200 MG PO TABS
200.0000 mg | ORAL_TABLET | Freq: Two times a day (BID) | ORAL | Status: DC
  Administered 2020-10-25: 200 mg via ORAL
  Filled 2020-10-25: qty 1

## 2020-10-25 MED ORDER — FENTANYL CITRATE (PF) 100 MCG/2ML IJ SOLN
INTRAMUSCULAR | Status: AC
  Filled 2020-10-25: qty 2

## 2020-10-25 MED ORDER — ENOXAPARIN SODIUM 60 MG/0.6ML IJ SOSY
60.0000 mg | PREFILLED_SYRINGE | INTRAMUSCULAR | Status: DC
  Administered 2020-10-26: 60 mg via SUBCUTANEOUS
  Filled 2020-10-25: qty 0.6

## 2020-10-25 MED ORDER — SODIUM CHLORIDE 0.9 % IV SOLN: 250.0000 mL | INTRAVENOUS | Status: DC | PRN

## 2020-10-25 MED ORDER — MIDAZOLAM HCL 2 MG/2ML IJ SOLN
INTRAMUSCULAR | Status: AC
  Filled 2020-10-25: qty 2

## 2020-10-25 MED ORDER — FENTANYL CITRATE (PF) 100 MCG/2ML IJ SOLN
INTRAMUSCULAR | Status: DC | PRN
  Administered 2020-10-25 (×2): 25 ug via INTRAVENOUS

## 2020-10-25 MED ORDER — DIGOXIN 125 MCG PO TABS
0.1250 mg | ORAL_TABLET | Freq: Every day | ORAL | Status: DC
  Administered 2020-10-25 – 2020-10-27 (×3): 0.125 mg via ORAL
  Filled 2020-10-25 (×3): qty 1

## 2020-10-25 MED ORDER — HEPARIN SODIUM (PORCINE) 1000 UNIT/ML IJ SOLN
INTRAMUSCULAR | Status: DC | PRN
  Administered 2020-10-25: 6000 [IU] via INTRAVENOUS

## 2020-10-25 MED ORDER — SODIUM CHLORIDE 0.9% FLUSH
3.0000 mL | Freq: Two times a day (BID) | INTRAVENOUS | Status: DC
  Administered 2020-10-25 – 2020-10-27 (×4): 3 mL via INTRAVENOUS

## 2020-10-25 MED ORDER — ENOXAPARIN SODIUM 40 MG/0.4ML IJ SOSY: 40.0000 mg | PREFILLED_SYRINGE | INTRAMUSCULAR | Status: DC

## 2020-10-25 MED ORDER — ONDANSETRON HCL 4 MG/2ML IJ SOLN: 4.0000 mg | Freq: Four times a day (QID) | INTRAMUSCULAR | Status: DC | PRN

## 2020-10-25 MED ORDER — LIDOCAINE HCL (PF) 1 % IJ SOLN
INTRAMUSCULAR | Status: AC
  Filled 2020-10-25: qty 30

## 2020-10-25 MED ORDER — ACETAMINOPHEN 325 MG PO TABS: 650.0000 mg | ORAL_TABLET | ORAL | Status: DC | PRN

## 2020-10-25 MED ORDER — GADOBUTROL 1 MMOL/ML IV SOLN
10.0000 mL | Freq: Once | INTRAVENOUS | Status: AC | PRN
  Administered 2020-10-25: 10 mL via INTRAVENOUS

## 2020-10-25 MED ORDER — HEPARIN (PORCINE) IN NACL 1000-0.9 UT/500ML-% IV SOLN
INTRAVENOUS | Status: DC | PRN
  Administered 2020-10-25 (×2): 500 mL

## 2020-10-25 MED ORDER — LABETALOL HCL 5 MG/ML IV SOLN: 10.0000 mg | INTRAVENOUS | Status: AC | PRN

## 2020-10-25 MED ORDER — HYDRALAZINE HCL 20 MG/ML IJ SOLN: 10.0000 mg | INTRAMUSCULAR | Status: AC | PRN

## 2020-10-25 MED ORDER — VERAPAMIL HCL 2.5 MG/ML IV SOLN
INTRAVENOUS | Status: AC
  Filled 2020-10-25: qty 2

## 2020-10-25 SURGICAL SUPPLY — 11 items
CATH 5FR JL3.5 JR4 ANG PIG MP (CATHETERS) ×2 IMPLANT
CATH SWAN GANZ 7F STRAIGHT (CATHETERS) ×2 IMPLANT
DEVICE RAD COMP TR BAND LRG (VASCULAR PRODUCTS) ×2 IMPLANT
GLIDESHEATH SLEND SS 6F .021 (SHEATH) ×2 IMPLANT
GLIDESHEATH SLENDER 7FR .021G (SHEATH) ×2 IMPLANT
GUIDEWIRE .025 260CM (WIRE) ×2 IMPLANT
GUIDEWIRE INQWIRE 1.5J.035X260 (WIRE) ×1 IMPLANT
INQWIRE 1.5J .035X260CM (WIRE) ×2
KIT HEART LEFT (KITS) ×2 IMPLANT
PACK CARDIAC CATHETERIZATION (CUSTOM PROCEDURE TRAY) ×2 IMPLANT
TRANSDUCER W/STOPCOCK (MISCELLANEOUS) ×2 IMPLANT

## 2020-10-25 NOTE — Interval H&P Note (Signed)
History and Physical Interval Note:  10/25/2020 8:57 AM  David Vang  has presented today for surgery, with the diagnosis of CHF.  The various methods of treatment have been discussed with the patient and family. After consideration of risks, benefits and other options for treatment, the patient has consented to  Procedure(s): RIGHT/LEFT HEART CATH AND CORONARY ANGIOGRAPHY (N/A) as a surgical intervention.  The patient's history has been reviewed, patient examined, no change in status, stable for surgery.  I have reviewed the patient's chart and labs.  Questions were answered to the patient's satisfaction.     Ndia Sampath [employer]

## 2020-10-25 NOTE — TOC Progression Note (Addendum)
Transition of Care (TOC) - Progression Note  Heart Failure  Patient Details  Name: Devantae Babe MRN: [id number] Date of Birth: [birth year]  Transition [facility] CM/SW Contact  Sherrell Farish, LCSWA Phone Number: 10/25/2020, 9:18 AM  Clinical Narrative:  CSW called [facility] to schedule the patient a primary care appointment, new patient or hospital follow up and they reported not having anything until August but that they will put Mr. Stork on the call back list to call him if there is any cancellations and they will reach out to the patient regarding updates.  4:45pm: CSW spoke with the patient at bedside to follow up regarding the PCP appointment and cardiologist informing the patient that he might not be able to work for a bit and possibly considering filling out disability forms. Patient reported being unable to provide his signature and CSW will follow up with Mr. Westenberger tomorrow.  TOC will continue to follow for discharge needs.    Barriers to Discharge: Continued Medical Work up  Expected Discharge Plan and Services   In-house Referral: Clinical Social Work     Living arrangements for the past 2 months: Apartment                                       Social Determinants of Health (SDOH) Interventions Food Insecurity Interventions: Intervention Not Indicated Financial Strain Interventions: Other (Comment) (referral to community clinic for PCP and provided patient with a CAFA application.) Housing Interventions: Intervention Not Indicated Transportation Interventions: Intervention Not Indicated  Readmission Risk Interventions No flowsheet data found.  Mekenna Finau, MSW, LCSWA [phone] Heart Failure Social Worker

## 2020-10-25 NOTE — TOC Benefit Eligibility Note (Signed)
Patient Advocate Encounter  Insurance verification completed.    The patient is uninsured  Talon Witting, CPhT Pharmacy Patient Advocate Specialist [place] [facility] Stewardship Team Direct Number: [phone]  Fax: [phone]        

## 2020-10-25 NOTE — Plan of Care (Signed)
  Problem: Clinical Measurements: Goal: Ability to maintain clinical measurements within normal limits will improve Outcome: Progressing Goal: Will remain free from infection Outcome: Progressing Goal: Respiratory complications will improve Outcome: Progressing Goal: Cardiovascular complication will be avoided Outcome: Progressing   Problem: Activity: Goal: Risk for activity intolerance will decrease Outcome: Progressing   Problem: Nutrition: Goal: Adequate nutrition will be maintained Outcome: Progressing   Problem: Respiratory: Goal: Ability to maintain adequate ventilation will improve Outcome: Progressing Goal: Ability to maintain a clear airway will improve Outcome: Progressing   Problem: Cardiac: Goal: Ability to achieve and maintain adequate cardiopulmonary perfusion will improve Outcome: Progressing

## 2020-10-25 NOTE — Progress Notes (Signed)
PROGRESS NOTE  David Vang ZOX:[id number] DOB: [birth year]   PCP: Pcp, No  Patient is from: Home.  DOA: 10/22/2020 LOS: 3  Chief complaints:  Chief Complaint  Patient presents with  . Shortness of Breath    Brief Narrative / Interim history: 30 year old M with PMH of morbid obesity and "recurrent pneumonia" presented to ED with shortness of breath and admitted with concern for "sepsis due to bilateral pneumonia". Reportedly, he was treated outpatient with oral antibiotic and steroids 2 weeks prior to presentation.   Patient had mild temp to 99.6 and leukocytosis to 15.2.  Lactic acid was within normal.  BNP, troponin and D-dimer were marginally elevated.  EKG with ventricular bigeminy.  CTA chest negative for PE but extensive airspace lung opacities, more evident on the right, with borderline cardiomegaly and a trace right pleural effusion concerning for asymmetric pulmonary edema or multifocal pneumonia.  COVID-19 and influenza PCR were negative.  His procalcitonin was negative.  He was started on broad-spectrum antibiotics and admitted for community-acquired pneumonia.   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  Repeat procalcitonin remain negative.  Antibiotics discontinued.  Cardiology consulted and started diuretics and GDMT.  R/LHC on 6/1 with elevated right and left heart filling pressures, moderate pulmonary venous hypertension, low cardiac output but no CAD.   Subjective: Seen and examined in the morning after he returned from Cath Lab.  No major events overnight or this morning.  Reports improvement in his cough.  Still with dyspnea on exertion but not at rest.  No other complaints.  Objective: Vitals:   10/25/20 1107 10/25/20 1126 10/25/20 1146 10/25/20 1159  BP: 115/61 (!) 104/58 106/60 111/64  Pulse: (!) 109 (!) 55 (!) 54 (!) 54  Resp: (!) 22 16 20  (!) 24  Temp: 98.1 F (36.7 C) 98.1 F (36.7 C) 98.1 F  (36.7 C) 98.1 F (36.7 C)  TempSrc: Oral Oral Oral Oral  SpO2: 98% 96% 94% 95%  Weight:      Height:        Intake/Output Summary (Last 24 hours) at 10/25/2020 1521 Last data filed at 10/25/2020 1300 Gross per 24 hour  Intake 463.07 ml  Output 700 ml  Net -236.93 ml   Filed Weights   10/22/20 1539  Weight: 122.5 kg    Examination:  GENERAL: No apparent distress.  Nontoxic. HEENT: MMM.  Vision and hearing grossly intact.  NECK: Supple.  No apparent JVD but difficult exam.  RESP: On RA.  No IWOB.  Fair aeration bilaterally. CVS: Irregular rhythm. Heart sounds normal.  ABD/GI/GU: BS+. Abd soft, NTND.  MSK/EXT:  Moves extremities. No apparent deformity. No edema.  SKIN: no apparent skin lesion or wound NEURO: Awake and alert. Oriented appropriately.  No apparent focal neuro deficit. PSYCH: Calm. Normal affect.   Procedures:  6/1-R/LHC with elevated right and left heart filling pressures, moderate pulmonary venous hypertension, low cardiac output but no CAD.  Microbiology summarized: COVID-19 PCR nonreactive. MRSA PCR screen negative. Blood cultures NGTD. C. difficile negative.  Assessment & Plan: Acute combined CHF/cardiomyopathy: Likely cause of his respiratory symptoms refractory to antibiotics.  He could have viral respiratory infections.  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  Significant family history.  His TSH and HIV was within normal.  EKG with ventricular bigeminy.  He had DOE and PND over the last 2 weeks.  R/LHC as above.  Inflammatory markers  elevated.  Started on diuretics.  I&O incomplete.  Renal function stable. -Advanced HF team managing  -Follow cMRI  -On IV Lasix 40 mg twice daily and digoxin 0.125 mg daily  -GDMT-Aldactone, losartan, Farxiga  -Outpatient sleep study to rule out OSA  -Outpatient genetic testing given significant family history  -LifeVest at DC given his PVC burden's  -Check risk stratification labs -Monitor fluid status  and renal functions -Sodium and fluid restrictions  PVCs-frequent bigeminy.  TSH within normal. -Mexiletine was stopped and amiodarone started -EP consulted  Community-acquired pneumonia-ruled out.  -Discontinue antibiotics  Diarrhea?-Resolved.  C. difficile negative.  Leukocytosis/bandemia: Likely demargination from recent steroid versus bacterial infection.  Improved. -Monitor  Morbid obesity Body mass index is 42.29 kg/m.  -Encourage lifestyle change to lose weight.         DVT prophylaxis:  SCD's Start: 10/25/20 1141O n subcu Lovenox  Code Status: Full code Family Communication: Patient and/or RN. Available if any question.  Level of care: Progressive Cardiac Status is: Inpatient  Remains inpatient appropriate because:Ongoing diagnostic testing needed not appropriate for outpatient work up, IV treatments appropriate due to intensity of illness or inability to take PO and Inpatient level of care appropriate due to severity of illness   Dispo: The patient is from: Home              Anticipated d/c is to: Home              Patient currently is not medically stable to d/c.   Difficult to place patient No       Consultants:  Advanced heart failure Electrophysiology   Sch Meds:  Scheduled Meds: . dapagliflozin propanediol  10 mg Oral Daily  . dextromethorphan-guaiFENesin  1 tablet Oral BID  . digoxin  0.125 mg Oral Daily  . [START ON 10/26/2020] enoxaparin (LOVENOX) injection  60 mg Subcutaneous Q24H  . furosemide  40 mg Intravenous BID  . losartan  12.5 mg Oral QHS  . sodium chloride flush  3 mL Intravenous Q12H  . sodium chloride flush  3 mL Intravenous Q12H  . spironolactone  12.5 mg Oral Daily   Continuous Infusions: . sodium chloride     PRN Meds:.sodium chloride, acetaminophen **OR** acetaminophen, hydrALAZINE, labetalol, ondansetron (ZOFRAN) IV, sodium chloride flush  Antimicrobials: Anti-infectives (From admission, onward)   Start     Dose/Rate  Route Frequency Ordered Stop   10/23/20 0600  ceFEPIme (MAXIPIME) 2 g in sodium chloride 0.9 % 100 mL IVPB  Status:  Discontinued        2 g 200 mL/hr over 30 Minutes Intravenous Every 8 hours 10/22/20 2257 10/24/20 1250   10/23/20 0600  vancomycin (VANCOREADY) IVPB 1750 mg/350 mL  Status:  Discontinued        1,750 mg 175 mL/hr over 120 Minutes Intravenous Every 8 hours 10/22/20 2259 10/23/20 0922   10/22/20 2345  vancomycin (VANCOREADY) IVPB 2000 mg/400 mL        2,000 mg 200 mL/hr over 120 Minutes Intravenous  Once 10/22/20 2249 10/23/20 0316   10/22/20 2345  ceFEPIme (MAXIPIME) 2 g in sodium chloride 0.9 % 100 mL IVPB        2 g 200 mL/hr over 30 Minutes Intravenous  Once 10/22/20 2249 10/23/20 0039   10/22/20 1700  cefTRIAXone (ROCEPHIN) 2 g in sodium chloride 0.9 % 100 mL IVPB        2 g 200 mL/hr over 30 Minutes Intravenous  Once 10/22/20 1654 10/22/20 1803  10/22/20 1700  doxycycline (VIBRA-TABS) tablet 100 mg        100 mg Oral  Once 10/22/20 1654 10/22/20 1721       I have personally reviewed the following labs and images: CBC: Recent Labs  Lab 10/22/20 1625 10/23/20 0148 10/24/20 0253 10/25/20 0922  WBC 15.1* 15.2* 12.8*  --   NEUTROABS 12.8* 11.7*  --   --   HGB 13.8 13.3 14.1 12.9*  12.9*  HCT 42.2 41.2 43.7 38.0*  38.0*  MCV 83.4 85.3 84.7  --   PLT 312 282 324  --    BMP &GFR Recent Labs  Lab 10/22/20 1625 10/22/20 2317 10/23/20 0148 10/24/20 0253 10/25/20 0433 10/25/20 0922  NA 135  --  136 138 135 138  138  K 4.0  --  3.9 3.9 4.4 3.8  3.7  CL 105  --  102 105 98  --   CO2 20*  --  25 24 27   --   GLUCOSE 96  --  123* 95 105*  --   BUN 11  --  10 6 13   --   CREATININE 0.99  --  1.04 0.88 1.08  --   CALCIUM 8.0*  --  8.6* 8.1* 8.6*  --   MG  --  1.8 1.9 1.7 2.4  --   PHOS  --   --  3.0  --  4.1  --    Estimated Creatinine Clearance: 126.6 mL/min (by C-G formula based on SCr of 1.08 mg/dL). Liver & Pancreas: Recent Labs  Lab 10/23/20 0148  10/25/20 0433  AST 30 16  ALT 55* 40  ALKPHOS 53 52  BILITOT 1.2 0.9  PROT 5.9* 6.1*  ALBUMIN 2.9* 2.9*   No results for input(s): LIPASE, AMYLASE in the last 168 hours. No results for input(s): AMMONIA in the last 168 hours. Diabetic: No results for input(s): HGBA1C in the last 72 hours. No results for input(s): GLUCAP in the last 168 hours. Cardiac Enzymes: No results for input(s): CKTOTAL, CKMB, CKMBINDEX, TROPONINI in the last 168 hours. No results for input(s): PROBNP in the last 8760 hours. Coagulation Profile: No results for input(s): INR, PROTIME in the last 168 hours. Thyroid Function Tests: Recent Labs    10/23/20 0503  TSH 1.775   Lipid Profile: Recent Labs    10/25/20 0434  CHOL 159  HDL 30*  LDLCALC 115*  TRIG 68  CHOLHDL 5.3   Anemia Panel: No results for input(s): VITAMINB12, FOLATE, FERRITIN, TIBC, IRON, RETICCTPCT in the last 72 hours. Urine analysis:    Component Value Date/Time   COLORURINE YELLOW 10/22/2020 0229   APPEARANCEUR CLEAR 10/22/2020 0229   LABSPEC 1.013 10/22/2020 0229   PHURINE 5.0 10/22/2020 0229   GLUCOSEU NEGATIVE 10/22/2020 0229   HGBUR NEGATIVE 10/22/2020 0229   BILIRUBINUR NEGATIVE 10/22/2020 0229   KETONESUR 5 (A) 10/22/2020 0229   PROTEINUR NEGATIVE 10/22/2020 0229   NITRITE NEGATIVE 10/22/2020 0229   LEUKOCYTESUR NEGATIVE 10/22/2020 0229   Sepsis Labs: Invalid input(s): PROCALCITONIN, LACTICIDVEN  Microbiology: Recent Results (from the past 240 hour(s))  Resp Panel by RT-PCR (Flu A&B, Covid) Nasopharyngeal Swab     Status: None   Collection Time: 10/22/20  4:25 PM   Specimen: Nasopharyngeal Swab; Nasopharyngeal(NP) swabs in vial transport medium  Result Value Ref Range Status   SARS Coronavirus 2 by RT PCR NEGATIVE NEGATIVE Final    Comment: (NOTE) SARS-CoV-2 target nucleic acids are NOT DETECTED.  The SARS-CoV-2 RNA is generally  detectable in upper respiratory specimens during the acute phase of infection. The  lowest concentration of SARS-CoV-2 viral copies this assay can detect is 138 copies/mL. A negative result does not preclude SARS-Cov-2 infection and should not be used as the sole basis for treatment or other patient management decisions. A negative result may occur with  improper specimen collection/handling, submission of specimen other than nasopharyngeal swab, presence of viral mutation(s) within the areas targeted by this assay, and inadequate number of viral copies(<138 copies/mL). A negative result must be combined with clinical observations, patient history, and epidemiological information. The expected result is Negative.  Fact Sheet for Patients:  BloggerCourse.com  Fact Sheet for Healthcare Providers:  SeriousBroker.it  This test is no t yet approved or cleared by the [place] FDA and  has been authorized for detection and/or diagnosis of SARS-CoV-2 by FDA under an Emergency Use Authorization (EUA). This EUA will remain  in effect (meaning this test can be used) for the duration of the COVID-19 declaration under Section 564(b)(1) of the Act, 21 U.S.C.section 360bbb-3(b)(1), unless the authorization is terminated  or revoked sooner.       Influenza A by PCR NEGATIVE NEGATIVE Final   Influenza B by PCR NEGATIVE NEGATIVE Final    Comment: (NOTE) The Xpert Xpress SARS-CoV-2/FLU/RSV plus assay is intended as an aid in the diagnosis of influenza from Nasopharyngeal swab specimens and should not be used as a sole basis for treatment. Nasal washings and aspirates are unacceptable for Xpert Xpress SARS-CoV-2/FLU/RSV testing.  Fact Sheet for Patients: BloggerCourse.com  Fact Sheet for Healthcare Providers: SeriousBroker.it  This test is not yet approved or cleared by the [place] FDA and has been authorized for detection and/or diagnosis of SARS-CoV-2 by FDA under  an Emergency Use Authorization (EUA). This EUA will remain in effect (meaning this test can be used) for the duration of the COVID-19 declaration under Section 564(b)(1) of the Act, 21 U.S.C. section 360bbb-3(b)(1), unless the authorization is terminated or revoked.  Performed at [facility], [address], [place], [place] [address]   Culture, blood (Routine X 2) w Reflex to ID Panel     Status: None (Preliminary result)   Collection Time: 10/22/20 11:17 PM   Specimen: BLOOD  Result Value Ref Range Status   Specimen Description BLOOD LEFT ANTECUBITAL  Final   Special Requests   Final    BOTTLES DRAWN AEROBIC ONLY Blood Culture results may not be optimal due to an inadequate volume of blood received in culture bottles   Culture   Final    NO GROWTH 3 DAYS Performed at [facility], [address], [place], [place] [address]    Report Status PENDING  Incomplete  Culture, blood (Routine X 2) w Reflex to ID Panel     Status: None (Preliminary result)   Collection Time: 10/22/20 11:27 PM   Specimen: BLOOD  Result Value Ref Range Status   Specimen Description BLOOD LEFT ANTECUBITAL  Final   Special Requests   Final    BOTTLES DRAWN AEROBIC ONLY Blood Culture results may not be optimal due to an inadequate volume of blood received in culture bottles   Culture   Final    NO GROWTH 3 DAYS Performed at [facility], [address], [place], [place] [address]    Report Status PENDING  Incomplete  MRSA PCR Screening     Status: None   Collection Time: 10/23/20  1:57 AM   Specimen: Nasal  Mucosa; Nasopharyngeal  Result Value Ref Range Status   MRSA by PCR NEGATIVE NEGATIVE Final    Comment:        The GeneXpert MRSA Assay (FDA approved for NASAL specimens only), is one component of a comprehensive MRSA colonization surveillance program. It is not intended to diagnose MRSA infection nor to guide or monitor treatment for MRSA infections. Performed at  [facility], [address], [place], [place] [address]   Expectorated Sputum Assessment w Gram Stain, Rflx to Resp Cult     Status: None   Collection Time: 10/23/20  5:11 AM   Specimen: Sputum  Result Value Ref Range Status   Specimen Description SPUTUM  Final   Special Requests NONE  Final   Sputum evaluation   Final    Sputum specimen not acceptable for testing.  Please recollect.   RESULT CALLED TO, READ BACK BY AND VERIFIED WITH: RN B.YAP ON [id number] AT 1424 BY E.PARRISH Performed at [facility], [address] [place], [place] [address]    Report Status 10/23/2020 FINAL  Final  C Difficile Quick Screen w PCR reflex     Status: None   Collection Time: 10/24/20 11:48 AM   Specimen: STOOL  Result Value Ref Range Status   C Diff antigen NEGATIVE NEGATIVE Final   C Diff toxin NEGATIVE NEGATIVE Final   C Diff interpretation No C. difficile detected.  Final    Comment: Performed at [facility], [address] [place], [place] [address]  Surgical pcr screen     Status: None   Collection Time: 10/24/20 10:12 PM   Specimen: Nasal Mucosa; Nasal Swab  Result Value Ref Range Status   MRSA, PCR NEGATIVE NEGATIVE Final   Staphylococcus aureus NEGATIVE NEGATIVE Final    Comment: (NOTE) The Xpert SA Assay (FDA approved for NASAL specimens in patients 54 years of age and older), is one component of a comprehensive surveillance program. It is not intended to diagnose infection nor to guide or monitor treatment. Performed at [facility], [address], [place], [place] [address]     Radiology Studies: CARDIAC CATHETERIZATION  Result Date: 10/25/2020 1. Elevated right and left heart filling pressures. 2. Moderate pulmonary venous hypertension. 3. Low cardiac output. 4. No coronary disease. Nonischemic cardiomyopathy.      Chenika Nevils T. Maeleigh Buschman [facility]  If 7PM-7AM, please contact night-coverage [email or web address] 10/25/2020, 3:21 PM

## 2020-10-25 NOTE — Care Management (Signed)
Spoke w David Vang, liaison for [facility]. She is aware of consult. Faxed order, notes, echo to her at [phone].

## 2020-10-25 NOTE — Plan of Care (Signed)
  Problem: Skin Integrity: Goal: Risk for impaired skin integrity will decrease Outcome: Completed/Met   Problem: Clinical Measurements: Goal: Ability to maintain a body temperature in the normal range will improve Outcome: Completed/Met

## 2020-10-25 NOTE — Progress Notes (Signed)
Patient ID: David Vang, male   DOB: 31-May-1990, 30 y.o.   MRN: [id number]     Advanced Heart Failure Rounding Note  PCP-Cardiologist: None   Subjective:    Patient says he diuresed well but I/Os and weights not recorded.  Still with some dyspnea.   RHC/LHC today: Coronary Findings   Diagnostic Dominance: Right  Left Main  Vessel was injected. Vessel is normal in caliber. Vessel is angiographically normal.  Left Anterior Descending  Vessel was injected. Vessel is normal in caliber. Vessel is angiographically normal.  Left Circumflex  Vessel was injected. Vessel is normal in caliber. Vessel is angiographically normal.  Right Coronary Artery  Vessel was injected. Vessel is normal in caliber. Vessel is angiographically normal.   Intervention   No interventions have been documented.  Right Heart  Right Heart Pressures RHC Procedural Findings: Hemodynamics (mmHg) RA mean 9 RV 48/13 PA 59/25 mean 38 PCWP mean 24 LV 98/19 AO 99/69  Oxygen saturations: PA 63% AO 100%  Cardiac Output (Fick) 4.3  Cardiac Index (Fick) 1.9 PVR 3.3 WU  Cardiac Output (Thermo) 5.1 Cardiac Index (Thermo) 2.2  PVR 2.7 WU      Objective:   Weight Range: 122.5 kg Body mass index is 42.29 kg/m.   Vital Signs:   Temp:  [98.1 F (36.7 C)-98.7 F (37.1 C)] 98.1 F (36.7 C) (06/01 1045) Pulse Rate:  [35-112] 112 (06/01 1045) Resp:  [16-34] 16 (06/01 1045) BP: (96-139)/(61-91) 102/61 (06/01 1045) SpO2:  [0 %-100 %] 98 % (06/01 1045) Last BM Date: 10/24/20  Weight change: Filed Weights   10/22/20 1539  Weight: 122.5 kg    Intake/Output:   Intake/Output Summary (Last 24 hours) at 10/25/2020 1049 Last data filed at 10/25/2020 0717 Gross per 24 hour  Intake 340.07 ml  Output 0 ml  Net 340.07 ml      Physical Exam    General:  Well appearing. No resp difficulty HEENT: Normal Neck: Supple. JVP 10-12 cm. Carotids 2+ bilat; no bruits. No lymphadenopathy or thyromegaly  appreciated. Cor: PMI nondisplaced. Regular rate & rhythm. No rubs, gallops or murmurs. Lungs: Clear Abdomen: Soft, nontender, nondistended. No hepatosplenomegaly. No bruits or masses. Good bowel sounds. Extremities: No cyanosis, clubbing, rash, edema Neuro: Alert & orientedx3, cranial nerves grossly intact. moves all 4 extremities w/o difficulty. Affect pleasant   Telemetry   NSR with frequent PVCs (bigeminal generally).  Personally reviewed.    Labs    CBC Recent Labs    10/22/20 1625 10/23/20 0148 10/24/20 0253  WBC 15.1* 15.2* 12.8*  NEUTROABS 12.8* 11.7*  --   HGB 13.8 13.3 14.1  HCT 42.2 41.2 43.7  MCV 83.4 85.3 84.7  PLT 312 282 324   Basic Metabolic Panel Recent Labs    65/53/74 0148 10/24/20 0253 10/25/20 0433  NA 136 138 135  K 3.9 3.9 4.4  CL 102 105 98  CO2 25 24 27   GLUCOSE 123* 95 105*  BUN 10 6 13   CREATININE 1.04 0.88 1.08  CALCIUM 8.6* 8.1* 8.6*  MG 1.9 1.7 2.4  PHOS 3.0  --  4.1   Liver Function Tests Recent Labs    10/23/20 0148 10/25/20 0433  AST 30 16  ALT 55* 40  ALKPHOS 53 52  BILITOT 1.2 0.9  PROT 5.9* 6.1*  ALBUMIN 2.9* 2.9*   No results for input(s): LIPASE, AMYLASE in the last 72 hours. Cardiac Enzymes No results for input(s): CKTOTAL, CKMB, CKMBINDEX, TROPONINI in the last 72 hours.  BNP: BNP (last 3 results) Recent Labs    10/22/20 1625 10/25/20 0434  BNP 183.1* 315.9*    ProBNP (last 3 results) No results for input(s): PROBNP in the last 8760 hours.   D-Dimer Recent Labs    10/22/20 1625  DDIMER 0.54*   Hemoglobin A1C No results for input(s): HGBA1C in the last 72 hours. Fasting Lipid Panel Recent Labs    10/25/20 0434  CHOL 159  HDL 30*  LDLCALC 115*  TRIG 68  CHOLHDL 5.3   Thyroid Function Tests Recent Labs    10/23/20 0503  TSH 1.775    Other results:   Imaging    CARDIAC CATHETERIZATION  Result Date: 10/25/2020 1. Elevated right and left heart filling pressures. 2. Moderate  pulmonary venous hypertension. 3. Low cardiac output. 4. No coronary disease. Nonischemic cardiomyopathy.      Medications:     Scheduled Medications: . amiodarone  200 mg Oral BID  . dapagliflozin propanediol  10 mg Oral Daily  . dextromethorphan-guaiFENesin  1 tablet Oral BID  . enoxaparin (LOVENOX) injection  60 mg Subcutaneous Q24H  . furosemide  40 mg Intravenous BID  . losartan  12.5 mg Oral QHS  . sodium chloride flush  3 mL Intravenous Q12H  . spironolactone  12.5 mg Oral Daily     Infusions:   PRN Medications:  acetaminophen **OR** acetaminophen    Assessment/Plan   1. Acute systolic CHF:  Newly found cardiomyopathy in patient with minimal past history, but father had LVAD placed for presumed NICM in his 30s/40s (has passed away). Echo showed EF 20-25%, no LVH, mild-moderate MR, normal RV.  No ETOH or drugs.  RHC/LHC today with no coronary disease, moderately elevated filling pressures and low cardiac output (2.2 thermo, 1.9 Fick).  Patient also noted to have very frequent PVCs.  Possible causes of CMP include PVC-mediated, viral myocarditis (URI-type symptoms recently), or familial CMP.   He remains volume overloaded on exam.  - Continue Lasix 40 mg IV bid.  - Continue losartan 12.5 mg daily (no BP room yet for Entresto) and spironolactone 12.5 daily.  - Add Farxiga 10 mg daily. - Add digoxin 0.125 daily.  - Cardiac MRI for infiltrative disease/myocarditis.   - Genetic testing for cardiomyopathy as outpatient given family history.  - Supect OSA, outpatient sleep study.  2. PVCs: Frequent, often bigeminal.  Mexiletine started yesterday without much effect.  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  Discussed with Dr. Graciela Husbands.  - Will stop mexiletine and start amiodarone 200 mg bid.  Not ideal long-term medication for him, but if LV function can improve some over time may be able to  stop.   Length of Stay: 3  Marca Ancona, MD  10/25/2020, 10:49 AM  Advanced Heart Failure Team Pager [phone] (M-F; 7a - 5p)  Please contact [facility] for night-coverage after hours (5p -7a ) and weekends on [email or web address]

## 2020-10-25 NOTE — Consult Note (Signed)
ELECTROPHYSIOLOGY CONSULT NOTE    Patient ID: David Vang MRN: [id number], DOB/AGE: 10/30/1990 30 y.o.  Admit date: 10/22/2020 Date of Consult: 10/25/2020  Primary Physician: Pcp, No Primary Cardiologist: New to Dr. Aundra Dubin  Electrophysiologist: New  Referring Provider: Dr. Aundra Dubin  Patient Profile: David Vang is a 30 y.o. male with a history of morbid obesity and family h/o cardiomyopathy  And a new diagnosis of systolic CHF who is being seen today for the evaluation of frequent PVCs at the request of Dr. Aundra Dubin.  HPI:  David Vang is a 30 y.o. male with medical history as above.   Pt reports his father was diagnosed w/ CM in his late 30s/early 63s, ultimately requiring an LVAD. His father died in his 91s from brain hemorrhage.   Pt recently treated for CAP approx 2 weeks ago with ABX and prednisone without improvement in his symptoms. He was seen at [facility] on 5/29 w/ worsening symptoms + subjective fever.   Met early sepsis criteria with CXR and CT suggestive of bilateral PNA and CHF. No PE on CT. Pertinent labs on admission included WBC 15K, Temp 99.6, Bicarb 20, lactic acid 1.4, BNP 183. COVID and Flu negative. UDS negative.  EKG showed ventricular bigeminy. Tachycardia but normotensive. Blood Cx drawn and started on Vanc + cefepime and IV lasix for CHF  Echo 10/23/20 showed LVEF 20-25% w global HK. No LVH, mild/mod MR, AoV normal, RV normal  Telemetry showed > 50 PVCs/hr and EP asked to see for further recommendations regarding treatment.   L/RHC this am with elevated L/R filling pressures, moderate pulmonary venous HTN, low cardiac output, and no CAD consistent with NONischemic cardiomyopathy.   Patient OK currently at rest, but still some SOB with activity. He feels very overwhelmed, understandably. He states he has no children and plans on having none.   Past Medical History:  Diagnosis Date  . Morbid obesity [facility])      Surgical History:   Past Surgical History:  Procedure Laterality Date  . RIGHT/LEFT HEART CATH AND CORONARY ANGIOGRAPHY N/A 10/25/2020   Procedure: RIGHT/LEFT HEART CATH AND CORONARY ANGIOGRAPHY;  Surgeon: Larey Dresser, MD;  Location: [place] [facility];  Service: Cardiovascular;  Laterality: N/A;     Medications Prior to Admission  Medication Sig Dispense Refill Last Dose  . albuterol (VENTOLIN HFA) 108 (90 Base) MCG/ACT inhaler Inhale 1-2 puffs into the lungs every 4 (four) hours as needed for wheezing or shortness of breath.   10/22/2020 at Unknown time  . ibuprofen (ADVIL) 200 MG tablet Take 400 mg by mouth every 6 (six) hours as needed for mild pain.   10/22/2020 at Unknown time  . levofloxacin (LEVAQUIN) 500 MG tablet Take 500 mg by mouth daily.   10/21/2020    Inpatient Medications:  . amiodarone  200 mg Oral BID  . dapagliflozin propanediol  10 mg Oral Daily  . dextromethorphan-guaiFENesin  1 tablet Oral BID  . digoxin  0.125 mg Oral Daily  . [START ON 10/26/2020] enoxaparin (LOVENOX) injection  40 mg Subcutaneous Q24H  . enoxaparin (LOVENOX) injection  60 mg Subcutaneous Q24H  . furosemide  40 mg Intravenous BID  . losartan  12.5 mg Oral QHS  . potassium chloride  20 mEq Oral Once  . sodium chloride flush  3 mL Intravenous Q12H  . sodium chloride flush  3 mL Intravenous Q12H  . spironolactone  12.5 mg Oral Daily    Allergies:  Allergies  Allergen Reactions  . Benadryl [  Diphenhydramine] Other (See Comments)    Pass out    Social History   Socioeconomic History  . Marital status: Married    Spouse name: Not on file  . Number of children: Not on file  . Years of education: Not on file  . Highest education level: Not on file  Occupational History  . Not on file  Tobacco Use  . Smoking status: Never Smoker  . Smokeless tobacco: Never Used  Substance and Sexual Activity  . Alcohol use: Not Currently  . Drug use: Never  . Sexual activity: Not on file  Other Topics Concern  . Not on  file  Social History Narrative  . Not on file   Social Determinants of Health   Financial Resource Strain: Medium Risk  . Difficulty of Paying Living Expenses: Somewhat hard  Food Insecurity: No Food Insecurity  . Worried About Charity fundraiser in the Last Year: Never true  . Ran Out of Food in the Last Year: Never true  Transportation Needs: No Transportation Needs  . Lack of Transportation (Medical): No  . Lack of Transportation (Non-Medical): No  Physical Activity: Not on file  Stress: Not on file  Social Connections: Not on file  Intimate Partner Violence: Not on file     Family History  Problem Relation Age of Onset  . Heart failure Father        dx'ed with cardiomyoopathy in his late 41's/40's, and ultimately required LVAD     Review of Systems: All other systems reviewed and are otherwise negative except as noted above.  Physical Exam: Vitals:   10/25/20 1019 10/25/20 1045 10/25/20 1107 10/25/20 1126  BP: 112/81 102/61 115/61 (!) 104/58  Pulse: (!) 112 (!) 112 (!) 109 (!) 55  Resp: 20 16 (!) 22 16  Temp: 98.1 F (36.7 C) 98.1 F (36.7 C) 98.1 F (36.7 C) 98.1 F (36.7 C)  TempSrc: Oral Oral Oral Oral  SpO2: 100% 98% 98% 96%  Weight:      Height:        GEN- The patient is well appearing, alert and oriented x 3 today.   HEENT: normocephalic, atraumatic; sclera clear, conjunctiva pink; hearing intact; oropharynx clear; neck supple Lungs- Clear to ausculation bilaterally, normal work of breathing.  No wheezes, rales, rhonchi Heart- Regular rate and rhythm, no murmurs, rubs or gallops GI- soft, non-tender, non-distended, bowel sounds present Extremities- no clubbing, cyanosis, or edema; DP/PT/radial pulses 2+ bilaterally MS- no significant deformity or atrophy Skin- warm and dry, no rash or lesion Psych- euthymic mood, full affect Neuro- strength and sensation are intact  Labs:   Lab Results  Component Value Date   WBC 12.8 (H) 10/24/2020   HGB 14.1  10/24/2020   HCT 43.7 10/24/2020   MCV 84.7 10/24/2020   PLT 324 10/24/2020    Recent Labs  Lab 10/25/20 0433  NA 135  K 4.4  CL 98  CO2 27  BUN 13  CREATININE 1.08  CALCIUM 8.6*  PROT 6.1*  BILITOT 0.9  ALKPHOS 52  ALT 40  AST 16  GLUCOSE 105*      Radiology/Studies: CT Angio Chest PE W and/or Wo Contrast  Result Date: 10/22/2020 CLINICAL DATA:  Worsening shortness of breath over the last 2 weeks. Diagnosed with pneumonia 2 weeks ago. Patient has been on antibiotics without relief. EXAM: CT ANGIOGRAPHY CHEST WITH CONTRAST TECHNIQUE: Multidetector CT imaging of the chest was performed using the standard protocol during bolus administration of intravenous  contrast. Multiplanar CT image reconstructions and MIPs were obtained to evaluate the vascular anatomy. CONTRAST:  131m OMNIPAQUE IOHEXOL 350 MG/ML SOLN COMPARISON:  Current chest radiograph. FINDINGS: Cardiovascular: Pulmonary arteries are well opacified. There is no evidence of a pulmonary embolism. Heart is borderline to mildly enlarged. No pericardial effusion. Great vessels are normal in caliber. Mediastinum/Nodes: Visualized thyroid is unremarkable. There are shotty prominent to mildly enlarged mediastinal and right hilar lymph nodes. A superior right paratracheal lymph node measures 1.4 cm in short axis. A probable conglomeration of right paratracheal lymph nodes just above the azygos arch measured 2 cm in short axis. An enlarged prevascular node measures 1.1 cm short axis. A right superior hilar node measures 1.6 cm in short axis. Trachea and esophagus are unremarkable. Lungs/Pleura: Trace right pleural effusion. Bilateral airspace opacities which predominate on the right, noted in all lobes, with similar appearing consolidation in the left lower lobe, sparing the left upper lobe. No interstitial thickening. No pneumothorax. Upper Abdomen: Decreased attenuation of the liver consistent with fatty infiltration. Otherwise  unremarkable. Musculoskeletal: No chest wall abnormality. No acute or significant osseous findings. Review of the MIP images confirms the above findings. IMPRESSION: 1. No evidence of a pulmonary embolism. 2. Extensive airspace lung opacities, more evident on the right, as detailed above. Patient has borderline cardiomegaly and a trace right pleural effusion. Findings may reflect asymmetric pulmonary edema or multifocal pneumonia. Electronically Signed   By: DLajean ManesM.D.   On: 10/22/2020 17:48   CARDIAC CATHETERIZATION  Result Date: 10/25/2020 1. Elevated right and left heart filling pressures. 2. Moderate pulmonary venous hypertension. 3. Low cardiac output. 4. No coronary disease. Nonischemic cardiomyopathy.   DG Chest Portable 1 View  Result Date: 10/22/2020 CLINICAL DATA:  30year old male with shortness of breath. Evaluate for pneumonia. EXAM: PORTABLE CHEST 1 VIEW COMPARISON:  None. FINDINGS: There is mild cardiomegaly with mild vascular congestion. Bilateral mid to lower lung field densities, right greater than left may represent edema or pneumonia. No large pleural effusion. No pneumothorax. No acute osseous pathology. IMPRESSION: 1. Cardiomegaly with mild vascular congestion. 2. Bilateral mid to lower lung field densities, right greater than left may represent edema or pneumonia. Electronically Signed   By: AAnner CreteM.D.   On: 10/22/2020 16:07   ECHOCARDIOGRAM COMPLETE  Result Date: 10/23/2020    ECHOCARDIOGRAM REPORT   Patient Name:   David BIRDWELLDate of Exam: 10/23/2020 Medical Rec #:  [id number]       Height:       67.0 in Accession #:    [id number]      Weight:       270.0 lb Date of Birth:  126-Dec-1992      BSA:          2.297 m Patient Age:    29 years         BP:           120/60 mmHg Patient Gender: M                HR:           60 bpm. Exam Location:  Inpatient Procedure: 2D Echo, Cardiac Doppler, Color Doppler and Intracardiac            Opacification Agent  Indications:    IQ96.43Acute diastolic (congestive) heart failure  History:        Patient has no prior history of Echocardiogram examinations.  Risk Factors:Family history of heart disease. Bilateral                 pneumonia. 5th time having pneumonia.  Sonographer:    Merrie Roof RDCS Referring Phys: [id number] [place]  1. Left ventricular ejection fraction, by estimation, is 20 to 25%. The left ventricle has severely decreased function. The left ventricle demonstrates global hypokinesis. The left ventricular internal cavity size was moderately dilated. Left ventricular diastolic parameters are consistent with Grade II diastolic dysfunction (pseudonormalization). Elevated left atrial pressure.  2. Right ventricular systolic function is normal. The right ventricular size is normal.  3. The mitral valve is normal in structure. Mild to moderate mitral valve regurgitation. No evidence of mitral stenosis.  4. The aortic valve is normal in structure. Aortic valve regurgitation is not visualized. No aortic stenosis is present.  5. The inferior vena cava is normal in size with greater than 50% respiratory variability, suggesting right atrial pressure of 3 mmHg. FINDINGS  Left Ventricle: Left ventricular ejection fraction, by estimation, is 20 to 25%. The left ventricle has severely decreased function. The left ventricle demonstrates global hypokinesis. Definity contrast agent was given IV to delineate the left ventricular endocardial borders. The left ventricular internal cavity size was moderately dilated. There is no left ventricular hypertrophy. Left ventricular diastolic parameters are consistent with Grade II diastolic dysfunction (pseudonormalization). Elevated left atrial pressure. Right Ventricle: The right ventricular size is normal. No increase in right ventricular wall thickness. Right ventricular systolic function is normal. Left Atrium: Left atrial size was normal in size.  Right Atrium: Right atrial size was normal in size. Pericardium: There is no evidence of pericardial effusion. Mitral Valve: The mitral valve is normal in structure. Mild to moderate mitral valve regurgitation. No evidence of mitral valve stenosis. Tricuspid Valve: The tricuspid valve is normal in structure. Tricuspid valve regurgitation is mild . No evidence of tricuspid stenosis. Aortic Valve: The aortic valve is normal in structure. Aortic valve regurgitation is not visualized. No aortic stenosis is present. Aortic valve mean gradient measures 5.0 mmHg. Aortic valve peak gradient measures 8.9 mmHg. Aortic valve area, by VTI measures 0.93 cm. Pulmonic Valve: The pulmonic valve was normal in structure. Pulmonic valve regurgitation is not visualized. No evidence of pulmonic stenosis. Aorta: The aortic root is normal in size and structure. Venous: The inferior vena cava is normal in size with greater than 50% respiratory variability, suggesting right atrial pressure of 3 mmHg. IAS/Shunts: No atrial level shunt detected by color flow Doppler.  LEFT VENTRICLE PLAX 2D LVIDd:         6.60 cm  Diastology LVIDs:         6.40 cm  LV e' medial:    11.00 cm/s LV PW:         0.80 cm  LV E/e' medial:  14.7 LV IVS:        0.70 cm  LV e' lateral:   9.68 cm/s LVOT diam:     1.80 cm  LV E/e' lateral: 16.7 LV SV:         23 LV SV Index:   10 LVOT Area:     2.54 cm  RIGHT VENTRICLE             IVC RV Basal diam:  3.70 cm     IVC diam: 1.80 cm RV S prime:     14.70 cm/s TAPSE (M-mode): 3.0 cm LEFT ATRIUM  Index       RIGHT ATRIUM           Index LA diam:        4.60 cm 2.00 cm/m  RA Area:     13.30 cm LA Vol (A2C):   60.4 ml 26.29 ml/m RA Volume:   32.50 ml  14.15 ml/m LA Vol (A4C):   32.6 ml 14.19 ml/m LA Biplane Vol: 47.1 ml 20.50 ml/m  AORTIC VALVE AV Area (Vmax):    0.94 cm AV Area (Vmean):   0.95 cm AV Area (VTI):     0.93 cm AV Vmax:           149.00 cm/s AV Vmean:          105.000 cm/s AV VTI:             0.247 m AV Peak Grad:      8.9 mmHg AV Mean Grad:      5.0 mmHg LVOT Vmax:         54.90 cm/s LVOT Vmean:        39.400 cm/s LVOT VTI:          0.090 m LVOT/AV VTI ratio: 0.37  AORTA Ao Root diam: 3.00 cm Ao Asc diam:  2.50 cm MITRAL VALVE MV Area (PHT): 7.74 cm     SHUNTS MV Decel Time: 98 msec      Systemic VTI:  0.09 m MV E velocity: 162.00 cm/s  Systemic Diam: 1.80 cm MV A velocity: 93.70 cm/s MV E/A ratio:  1.73 Candee Furbish MD Electronically signed by Candee Furbish MD Signature Date/Time: 10/23/2020/11:12:33 AM    Final     EKG: on arrival showed "sinus tach" at 141 with very frequent PVCs in a pattern bigeminy (personally reviewed)  TELEMETRY: Sinus tachycardia 110-130s confounded by very frequent PVCs (personally reviewed)  Assessment/Plan: 1.  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 Caryl Comes discussed with Dr. Aundra Dubin and recommends initial suppression with amiodarone and ultimately ablation consideration once he is out of this acute window.  Unclear if primary to or secondary to his cardiomyopathy, though with OT origin ? Primary per discussions.  TSH pending Per Dr. Caryl Comes, will plan discussion with rest of EP MDs re: timing of ablation consideration.  2. Acute systolic CHF New diagnosis this admission, confounded by CAP.  Echo 10/23/2020 LVEF 20-25% NICM by cath cMRI pending  Ongoing GDMT titration per CHF team Suspected OSA noted with plans for outpatient sleep study Plans for outpatient genetic testing noted No ETOH use. UDS negative TSH WNL. HIV negative.  ADDENDUM:  Dr. Caryl Comes has discussed with Dr. Quentin Ore and will plan on leaving OFF amiodarone for now with sooner plans to consider ablation.  For questions or updates, please contact [place] Please consult [email or web address] for contact info under Cardiology/STEMI.  Jacalyn Lefevre, PA-C  10/25/2020 11:45 AM

## 2020-10-26 DIAGNOSIS — E876 Hypokalemia: Secondary | ICD-10-CM

## 2020-10-26 DIAGNOSIS — I493 Ventricular premature depolarization: Secondary | ICD-10-CM

## 2020-10-26 DIAGNOSIS — E871 Hypo-osmolality and hyponatremia: Secondary | ICD-10-CM

## 2020-10-26 LAB — BASIC METABOLIC PANEL
Anion gap: 14 (ref 5–15)
BUN: 16 mg/dL (ref 6–20)
CO2: 23 mmol/L (ref 22–32)
Calcium: 8.9 mg/dL (ref 8.9–10.3)
Chloride: 97 mmol/L — ABNORMAL LOW (ref 98–111)
Creatinine, Ser: 1 mg/dL (ref 0.61–1.24)
GFR, Estimated: >60 mL/min (ref >60)
Glucose, Bld: 82 mg/dL (ref 70–99)
Potassium: 3.1 mmol/L — ABNORMAL LOW (ref 3.5–5.1)
Sodium: 134 mmol/L — ABNORMAL LOW (ref 135–145)

## 2020-10-26 LAB — CBC
HCT: 41.7 % (ref 39.0–52.0)
Hemoglobin: 13.6 g/dL (ref 13.0–17.0)
MCH: 26.9 pg (ref 26.0–34.0)
MCHC: 32.6 g/dL (ref 30.0–36.0)
MCV: 82.6 fL (ref 80.0–100.0)
Platelets: 352 10*3/uL (ref 150–400)
RBC: 5.05 MIL/uL (ref 4.22–5.81)
RDW: 13.2 % (ref 11.5–15.5)
WBC: 9.5 10*3/uL (ref 4.0–10.5)
nRBC: 0 % (ref 0.0–0.2)

## 2020-10-26 LAB — HEMOGLOBIN A1C
Hgb A1c MFr Bld: 6.1 % — ABNORMAL HIGH (ref 4.8–5.6)
Mean Plasma Glucose: 128 mg/dL

## 2020-10-26 LAB — MAGNESIUM: Magnesium: 2.2 mg/dL (ref 1.7–2.4)

## 2020-10-26 MED ORDER — SPIRONOLACTONE 25 MG PO TABS
25.0000 mg | ORAL_TABLET | Freq: Every day | ORAL | Status: DC
  Administered 2020-10-27: 25 mg via ORAL
  Filled 2020-10-26: qty 1

## 2020-10-26 MED ORDER — POTASSIUM CHLORIDE CRYS ER 20 MEQ PO TBCR: 40.0000 meq | EXTENDED_RELEASE_TABLET | ORAL | Status: DC

## 2020-10-26 MED ORDER — ENOXAPARIN SODIUM 60 MG/0.6ML IJ SOSY
55.0000 mg | PREFILLED_SYRINGE | INTRAMUSCULAR | Status: DC
  Administered 2020-10-27: 55 mg via SUBCUTANEOUS
  Filled 2020-10-26: qty 0.6

## 2020-10-26 MED ORDER — CARVEDILOL 3.125 MG PO TABS
3.1250 mg | ORAL_TABLET | Freq: Two times a day (BID) | ORAL | Status: DC
  Administered 2020-10-26 – 2020-10-27 (×2): 3.125 mg via ORAL
  Filled 2020-10-26 (×2): qty 1

## 2020-10-26 MED ORDER — POTASSIUM CHLORIDE CRYS ER 20 MEQ PO TBCR
40.0000 meq | EXTENDED_RELEASE_TABLET | ORAL | Status: AC
  Administered 2020-10-26 (×3): 40 meq via ORAL
  Filled 2020-10-26 (×3): qty 2

## 2020-10-26 MED ORDER — BENZONATATE 100 MG PO CAPS
100.0000 mg | ORAL_CAPSULE | Freq: Two times a day (BID) | ORAL | Status: DC | PRN
  Administered 2020-10-26 – 2020-10-27 (×2): 100 mg via ORAL
  Filled 2020-10-26 (×2): qty 1

## 2020-10-26 NOTE — TOC Progression Note (Addendum)
Transition of Care (TOC) - Progression Note  Heart Failure   Patient Details  Name: Lonald Troiani MRN: [id number] Date of Birth: 05-20-1991  Transition of Care [facility]) CM/SW Contact  Jakelin Taussig, LCSWA Phone Number: 10/26/2020, 3:29 PM  Clinical Narrative:    CSW spoke with the patient and his wife, Elisa at bedside to follow up from conversation yesterday regarding him not having health insurance and disability paperwork with Mr. Michiels being unable to work per request of Cardiology. The patient reported being open to [employer] and a disability application and Elisa reported that they are open to any support at this time. CSW obtained their signature for the Food Stamp and disability referral for the [facility] to reach out to them. Elisa reported that CHW did reach out to her about an appointment for Mr. Zajicek in July and that being the soonest available. Patient asked about his medications and the pharmacist's telling him about the orange card and being unsure of what all this means for him. CSW explained the orange card to the patient and it helping with the cost of his medications. CSW, patient, and his wife discussed about [employer] or [employer] as an option but that it would be an out of pocket cost. CSW provided active listening and support and answered questions from the patient and his wife regarding concerns.  CSW will continue to follow throughout discharge.     Barriers to Discharge: Continued Medical Work up  Expected Discharge Plan and Services   In-house Referral: Clinical Social Work     Living arrangements for the past 2 months: Apartment Expected Discharge Date: 10/25/20                                     Social Determinants of Health (SDOH) Interventions Food Insecurity Interventions: Intervention Not Indicated Financial Strain Interventions: Other (Comment) (referral to community clinic for PCP and provided patient with a CAFA  application.) Housing Interventions: Intervention Not Indicated Transportation Interventions: Intervention Not Indicated  Readmission Risk Interventions No flowsheet data found.  Jaelyn Cloninger, MSW, LCSWA [phone] Heart Failure Social Worker

## 2020-10-26 NOTE — Progress Notes (Signed)
Electrophysiology Rounding Note  Patient Name: David Vang Date of Encounter: 10/26/2020  Primary Cardiologist: New to Dr. Shirlee Latch  Electrophysiologist: New to Dr. Lalla Brothers   Subjective   Denies any new symptoms currently.   He has been looking at family history. Paternal great grandmother had "heart issues". Paternal grandfather died in a car accident suspicious for loss of consciousness otherwise healthy.  His father, in addition to medical history already discussed, was on medications to prevent "sudden cardiac death" and biventricular HF which improved prior to him being considered for transplant. He eventually got an LVAD but ultimately passed away from what sounds like MSOF in 2005 which ultimately led to brain bleed.  He has no siblings from his biological father. He has no children.  Inpatient Medications    Scheduled Meds: . dapagliflozin propanediol  10 mg Oral Daily  . dextromethorphan-guaiFENesin  1 tablet Oral BID  . digoxin  0.125 mg Oral Daily  . enoxaparin (LOVENOX) injection  60 mg Subcutaneous Q24H  . furosemide  40 mg Intravenous BID  . losartan  12.5 mg Oral QHS  . potassium chloride  40 mEq Oral Q4H  . sodium chloride flush  3 mL Intravenous Q12H  . sodium chloride flush  3 mL Intravenous Q12H  . spironolactone  12.5 mg Oral Daily   Continuous Infusions: . sodium chloride     PRN Meds: sodium chloride, acetaminophen **OR** acetaminophen, ondansetron (ZOFRAN) IV, sodium chloride flush   Vital Signs    Vitals:   10/25/20 2349 10/26/20 0341 10/26/20 0350 10/26/20 0351  BP: (!) 102/92 (!) 81/70 (!) 110/54   Pulse: (!) 53 89    Resp: 18 16    Temp: 98.1 F (36.7 C) 97.8 F (36.6 C)    TempSrc: Oral Oral    SpO2: 97% 93%    Weight:    104.6 kg  Height:        Intake/Output Summary (Last 24 hours) at 10/26/2020 0853 Last data filed at 10/26/2020 0350 Gross per 24 hour  Intake 1318 ml  Output 3080 ml  Net -1762 ml   Filed Weights   10/22/20  1539 10/26/20 0351  Weight: 122.5 kg 104.6 kg    Physical Exam    GEN- The patient is well appearing, alert and oriented x 3 today.   Head- normocephalic, atraumatic Eyes-  Sclera clear, conjunctiva pink Ears- hearing intact Oropharynx- clear Neck- supple Lungs- Clear to ausculation bilaterally, normal work of breathing Heart- Irregular due to ectopy.  no murmurs, rubs or gallops GI- soft, NT, ND, + BS Extremities- no clubbing or cyanosis. No edema Skin- no rash or lesion Psych- euthymic mood, full affect Neuro- strength and sensation are intact  Labs    CBC Recent Labs    10/24/20 0253 10/25/20 0922 10/26/20 0421  WBC 12.8*  --  9.5  HGB 14.1 12.9*  12.9* 13.6  HCT 43.7 38.0*  38.0* 41.7  MCV 84.7  --  82.6  PLT 324  --  352   Basic Metabolic Panel Recent Labs    63/78/58 0433 10/25/20 0922 10/26/20 0421  NA 135 138  138 134*  K 4.4 3.8  3.7 3.1*  CL 98  --  97*  CO2 27  --  23  GLUCOSE 105*  --  82  BUN 13  --  16  CREATININE 1.08  --  1.00  CALCIUM 8.6*  --  8.9  MG 2.4  --  2.2  PHOS 4.1  --   --  Liver Function Tests Recent Labs    10/25/20 0433  AST 16  ALT 40  ALKPHOS 52  BILITOT 0.9  PROT 6.1*  ALBUMIN 2.9*   No results for input(s): LIPASE, AMYLASE in the last 72 hours. Cardiac Enzymes No results for input(s): CKTOTAL, CKMB, CKMBINDEX, TROPONINI in the last 72 hours.   Telemetry    NSR with very frequent PVCs including bigeminy, trigeminy, and quadrigeminy (personally reviewed)  Radiology    CARDIAC CATHETERIZATION  Result Date: 10/25/2020 1. Elevated right and left heart filling pressures. 2. Moderate pulmonary venous hypertension. 3. Low cardiac output. 4. No coronary disease. Nonischemic cardiomyopathy.    Patient Profile     [name] is a 30 y.o. male with a history of morbid obesity and family h/o cardiomyopathy  And a new diagnosis of systolic CHF who is being seen today for the evaluation of frequent PVCs at  the request of Dr. Shirlee Latch.  Assessment & Plan    1.  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 ?RVOT more likely vs LVOT with initial R in aVR. Transition in V3 Unclear if primary to or secondary to his cardiomyopathy, though with OT origin ? Primary per discussions.  Dr. Lalla Brothers to see today. Will plan close outpatient follow up to discuss and plan ablation.   2. Acute systolic CHF New diagnosis this admission, confounded by CAP.  Echo 10/23/2020 LVEF 20-25% NICM by cath cMRI pending read Ongoing GDMT titration per CHF team Suspected OSA noted with plans for outpatient sleep study Plans for outpatient genetic testing noted No ETOH use. UDS negative TSH WNL. HIV negative. Plan for lifevest per CHF team.  For questions or updates, please contact [facility] Please consult [email or web address] for contact info under Cardiology/STEMI.  Signed, Graciella Freer, PA-C  10/26/2020, 8:53 AM

## 2020-10-26 NOTE — Plan of Care (Signed)
?  Problem: Clinical Measurements: ?Goal: Will remain free from infection ?Outcome: Progressing ?  ?

## 2020-10-26 NOTE — Plan of Care (Signed)
  Problem: Clinical Measurements: Goal: Will remain free from infection 10/26/2020 2124 by Gwynne Edinger, RN Outcome: Progressing 10/26/2020 1521 by Gwynne Edinger, RN Outcome: Progressing   Problem: Clinical Measurements: Goal: Respiratory complications will improve Outcome: Progressing

## 2020-10-26 NOTE — Progress Notes (Addendum)
Patient ID: David Vang, male   DOB: 1991/04/08, 30 y.o.   MRN: [id number]     Advanced Heart Failure Rounding Note  PCP-Cardiologist: None   Subjective:    NICM. LHC w/ normal cors. RHC w/ elevated R/LHC filling pressures, mod PH and low cardiac output (CI 2.2 thermo, 1.9 Fick)  cMRI completed, interpretation pending.   Continues w/ frequent PVCs but less w/ amio. EP to see today. LifeVest delivered.   Feels better today. Responding to lasix, -3L in UOP yesterday. Scr stable, 1.00. K 3.1. Mg 2.2      RHC/LHC  Coronary Findings   Diagnostic Dominance: Right  Left Main  Vessel was injected. Vessel is normal in caliber. Vessel is angiographically normal.  Left Anterior Descending  Vessel was injected. Vessel is normal in caliber. Vessel is angiographically normal.  Left Circumflex  Vessel was injected. Vessel is normal in caliber. Vessel is angiographically normal.  Right Coronary Artery  Vessel was injected. Vessel is normal in caliber. Vessel is angiographically normal.   Intervention   No interventions have been documented.  Right Heart  Right Heart Pressures RHC Procedural Findings: Hemodynamics (mmHg) RA mean 9 RV 48/13 PA 59/25 mean 38 PCWP mean 24 LV 98/19 AO 99/69  Oxygen saturations: PA 63% AO 100%  Cardiac Output (Fick) 4.3  Cardiac Index (Fick) 1.9 PVR 3.3 WU  Cardiac Output (Thermo) 5.1 Cardiac Index (Thermo) 2.2  PVR 2.7 WU      Objective:   Weight Range: 104.6 kg Body mass index is 36.13 kg/m.   Vital Signs:   Temp:  [97.8 F (36.6 C)-98.6 F (37 C)] 97.8 F (36.6 C) (06/02 0341) Pulse Rate:  [47-112] 89 (06/02 0341) Resp:  [16-24] 16 (06/02 0341) BP: (81-183)/(54-92) 110/54 (06/02 0350) SpO2:  [93 %-100 %] 93 % (06/02 0341) Weight:  [104.6 kg] 104.6 kg (06/02 0351) Last BM Date: 10/25/20  Weight change: Filed Weights   10/22/20 1539 10/26/20 0351  Weight: 122.5 kg 104.6 kg    Intake/Output:   Intake/Output  Summary (Last 24 hours) at 10/26/2020 1018 Last data filed at 10/26/2020 0350 Gross per 24 hour  Intake 1318 ml  Output 3080 ml  Net -1762 ml      Physical Exam    PHYSICAL EXAM: General:  Well appearing, moderately obese. No respiratory difficulty HEENT: normal Neck: supple.  JVD 8 cm. Carotids 2+ bilat; no bruits. No lymphadenopathy or thyromegaly appreciated. Cor: PMI nondisplaced. irregular rhythm (frequent  PVCs) No rubs, gallops or murmurs. Lungs: clear Abdomen: soft, nontender, nondistended. No hepatosplenomegaly. No bruits or masses. Good bowel sounds. Extremities: no cyanosis, clubbing, rash, edema Neuro: alert & oriented x 3, cranial nerves grossly intact. moves all 4 extremities w/o difficulty. Affect pleasant.    Telemetry   NSR with frequent PVCs (bigeminal generally).  Personally reviewed.    Labs    CBC Recent Labs    10/24/20 0253 10/25/20 0922 10/26/20 0421  WBC 12.8*  --  9.5  HGB 14.1 12.9*  12.9* 13.6  HCT 43.7 38.0*  38.0* 41.7  MCV 84.7  --  82.6  PLT 324  --  352   Basic Metabolic Panel Recent Labs    00/93/81 0433 10/25/20 0922 10/26/20 0421  NA 135 138  138 134*  K 4.4 3.8  3.7 3.1*  CL 98  --  97*  CO2 27  --  23  GLUCOSE 105*  --  82  BUN 13  --  16  CREATININE 1.08  --  1.00  CALCIUM 8.6*  --  8.9  MG 2.4  --  2.2  PHOS 4.1  --   --    Liver Function Tests Recent Labs    10/25/20 0433  AST 16  ALT 40  ALKPHOS 52  BILITOT 0.9  PROT 6.1*  ALBUMIN 2.9*   No results for input(s): LIPASE, AMYLASE in the last 72 hours. Cardiac Enzymes No results for input(s): CKTOTAL, CKMB, CKMBINDEX, TROPONINI in the last 72 hours.  BNP: BNP (last 3 results) Recent Labs    10/22/20 1625 10/25/20 0434  BNP 183.1* 315.9*    ProBNP (last 3 results) No results for input(s): PROBNP in the last 8760 hours.   D-Dimer No results for input(s): DDIMER in the last 72 hours. Hemoglobin A1C Recent Labs    10/25/20 0434  HGBA1C 6.1*    Fasting Lipid Panel Recent Labs    10/25/20 0434  CHOL 159  HDL 30*  LDLCALC 115*  TRIG 68  CHOLHDL 5.3   Thyroid Function Tests No results for input(s): TSH, T4TOTAL, T3FREE, THYROIDAB in the last 72 hours.  Invalid input(s): FREET3  Other results:   Imaging    No results found.   Medications:     Scheduled Medications: . dapagliflozin propanediol  10 mg Oral Daily  . dextromethorphan-guaiFENesin  1 tablet Oral BID  . digoxin  0.125 mg Oral Daily  . enoxaparin (LOVENOX) injection  60 mg Subcutaneous Q24H  . furosemide  40 mg Intravenous BID  . losartan  12.5 mg Oral QHS  . potassium chloride  40 mEq Oral Q4H  . sodium chloride flush  3 mL Intravenous Q12H  . sodium chloride flush  3 mL Intravenous Q12H  . spironolactone  12.5 mg Oral Daily    Infusions: . sodium chloride      PRN Medications: sodium chloride, acetaminophen **OR** acetaminophen, ondansetron (ZOFRAN) IV, sodium chloride flush    Assessment/Plan   1. Acute systolic CHF:  Newly found cardiomyopathy in patient with minimal past history, but father had LVAD placed for presumed NICM in his 30s/40s (has passed away). Echo showed EF 20-25%, no LVH, mild-moderate MR, normal RV.  No ETOH or drugs.  RHC/LHC with no coronary disease, moderately elevated filling pressures and low cardiac output (2.2 thermo, 1.9 Fick).  Patient also noted to have very frequent PVCs.  Possible causes of CMP include PVC-mediated, viral myocarditis (URI-type symptoms recently), or familial CMP.  Volume status improving w/ Lasix.   - Give another dose of IV Lasix today and transition to PO tomorrow  - Continue losartan 12.5 mg daily (no BP room yet for Entresto)  - Increase spironolactone to 25 daily.  - Continue Farxiga 10 mg daily. - Continue digoxin 0.125 daily.  - Cardiac MRI for infiltrative disease/myocarditis.   - Genetic testing for cardiomyopathy as outpatient given family history.  - Supect OSA, outpatient sleep  study.  - Home w/ LifeVest  2. PVCs: Frequent, often bigeminal.  Mexiletine started yesterday without much effect.  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  Discussed with Dr. Graciela Husbands.  - C/w amiodarone 200 mg bid.  Not ideal long-term medication for him, but if LV function can improve some over time may be able to stop.  - EP to see today for possible ablation  - supp K   Length of Stay: [address] PA-C  10/26/2020, 10:18 AM  Advanced Heart  Failure Team Pager [phone] (M-F; 7a - 5p)  Please contact [facility] for night-coverage after hours (5p -7a ) and weekends on [email or web address]  Patient seen with PA, agree with the above note.   Cardiac MRI reviewed, no significant late gadolinium enhancement seen.   Patient feels better, no dyspnea.  Still with PVCs but less after starting amiodarone.   General: NAD Neck: JVP 8-9 cm, no thyromegaly or thyroid nodule.  Lungs: Clear to auscultation bilaterally with normal respiratory effort. CV: Nondisplaced PMI.  Heart regular S1/S2, no S3/S4, no murmur.  No peripheral edema.   Abdomen: Soft, nontender, no hepatosplenomegaly, no distention.  Skin: Intact without lesions or rashes.  Neurologic: Alert and oriented x 3.  Psych: Normal affect. Extremities: No clubbing or cyanosis.  HEENT: Normal.   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  Mild residual volume overload. No significant delayed enhancement on MRI.  - Continue Lasix 40 mg IV bid today, back to po tomorrow.  - Continue losartan 12.5 mg daily, Farxiga 10 daily, digoxin 0.125 - Increase spironolactone to 25 daily.  - Add Coreg to 3.125 mg bid (stopping amiodarone, see below).  - Transition to [facility] tomorrow if BP stable.   EP to see with frequent PVCs (RVOT-type).  Will hold amiodarone for now in preparation for possible future  ablation. Instead, will start Coreg 3.125 mg bid.  He will get a Lifevest.   Marca Ancona 10/26/2020 1:11 PM

## 2020-10-26 NOTE — Progress Notes (Signed)
Potassium level 3.1. Dr. Leafy Half notified via [facility] communication.

## 2020-10-26 NOTE — Progress Notes (Signed)
PROGRESS NOTE  David Vang ZYS:[id number] DOB: 01/31/1991   PCP: Pcp, No  Patient is from: Home.  DOA: 10/22/2020 LOS: 4  Chief complaints:  Chief Complaint  Patient presents with  . Shortness of Breath    Brief Narrative / Interim history: 30 year old M with PMH of morbid obesity and "recurrent pneumonia" presented to ED with shortness of breath and admitted with concern for "sepsis due to bilateral pneumonia". Reportedly, he was treated outpatient with oral antibiotic and steroids 2 weeks prior to presentation.   Patient had mild temp to 99.6 and leukocytosis to 15.2.  Lactic acid was within normal.  BNP, troponin and D-dimer were marginally elevated.  EKG with ventricular bigeminy.  CTA chest negative for PE but extensive airspace lung opacities, more evident on the right, with borderline cardiomegaly and a trace right pleural effusion concerning for asymmetric pulmonary edema or multifocal pneumonia.  COVID-19 and influenza PCR were negative.  His procalcitonin was negative.  He was started on broad-spectrum antibiotics and admitted for community-acquired pneumonia.   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  Repeat procalcitonin remain negative.  Antibiotics discontinued.  Cardiology consulted and started diuretics and GDMT.  R/LHC on 6/1 with elevated right and left heart filling pressures, moderate pulmonary venous hypertension, low cardiac output but no CAD. cMRI result pending  Subjective: Seen and examined earlier this morning.  No major events overnight or this morning.  He says he could not fall asleep due to noise from other rooms.  Reports improvement in his breathing and cough.  Denies chest pain, GI or UTI symptoms.  Objective: Vitals:   10/26/20 0341 10/26/20 0350 10/26/20 0351 10/26/20 1026  BP: (!) 81/70 (!) 110/54  112/75  Pulse: 89   (!) 107  Resp: 16   18  Temp: 97.8 F (36.6 C)   97.7 F (36.5 C)   TempSrc: Oral   Oral  SpO2: 93%   98%  Weight:   104.6 kg   Height:        Intake/Output Summary (Last 24 hours) at 10/26/2020 1111 Last data filed at 10/26/2020 0350 Gross per 24 hour  Intake 1318 ml  Output 3080 ml  Net -1762 ml   Filed Weights   10/22/20 1539 10/26/20 0351  Weight: 122.5 kg 104.6 kg    Examination:  GENERAL: No apparent distress.  Nontoxic. HEENT: MMM.  Vision and hearing grossly intact.  NECK: Supple.  No apparent JVD but difficult exam.  RESP: On RA.  No IWOB.  Fair aeration bilaterally. CVS: Irregular rhythm.  Normal rate. Heart sounds normal.  ABD/GI/GU: BS+. Abd soft, NTND.  MSK/EXT:  Moves extremities. No apparent deformity. No edema.  SKIN: no apparent skin lesion or wound NEURO: Awake and alert. Oriented appropriately.  No apparent focal neuro deficit. PSYCH: Calm. Normal affect.  Procedures:  6/1-R/LHC with elevated right and left heart filling pressures, moderate pulmonary venous hypertension, low cardiac output but no CAD.  Microbiology summarized: COVID-19 PCR nonreactive. MRSA PCR screen negative. Blood cultures NGTD. C. difficile negative.  Assessment & Plan: Acute combined CHF/cardiomyopathy: Likely cause of his respiratory symptoms refractory to antibiotics.  He could have viral respiratory infections.  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  Significant family history.  His TSH and HIV was within normal.  EKG with ventricular bigeminy.  He had DOE and PND over the last 2 weeks.  R/LHC as above.  Inflammatory markers elevated.  Started on diuretics.  About 3 L UOP/24 hours.  Net -1 L.  Renal function stable. -Advanced HF team managing  -Follow cMRI  -On IV Lasix 40 mg twice daily and digoxin 0.125 mg daily  -GDMT-Aldactone, losartan, Farxiga  -Outpatient sleep study to rule out OSA  -Outpatient genetic testing given significant family history  -Now on LifeVest. -Monitor fluid status and renal functions -Sodium and  fluid restrictions  PVCs-frequent bigeminy.  TSH within normal. -Mexiletine was stopped and amiodarone started -EP following.  Now on LifeVest. -Optimize Mg and K  Hyponatremia-likely due to Aldactone and losartan. -Continue monitoring  Hypokalemia: K3.1.  Mg 2.2. -K-Dur 40 mill equivalent x3 -Aldactone increased from 12.5 to 25 mg daily. -Recheck in the morning  Community-acquired pneumonia-ruled out.  -Discontinue antibiotics  Diarrhea-Resolved.  C. difficile negative.  Leukocytosis/bandemia: Likely demargination from recent steroid versus bacterial infection.  Improved. -Monitor  Morbid obesity Body mass index is 36.13 kg/m.  -Encourage lifestyle change to lose weight.         DVT prophylaxis:  SCD's Start: 10/25/20 1141O On subcu Lovenox  Code Status: Full code Family Communication: Patient and/or RN. Available if any question.  Level of care: Progressive Cardiac Status is: Inpatient  Remains inpatient appropriate because:Ongoing diagnostic testing needed not appropriate for outpatient work up, IV treatments appropriate due to intensity of illness or inability to take PO and Inpatient level of care appropriate due to severity of illness   Dispo: The patient is from: Home              Anticipated d/c is to: Home              Patient currently is not medically stable to d/c.   Difficult to place patient No       Consultants:  Advanced heart failure Electrophysiology   Sch Meds:  Scheduled Meds: . dapagliflozin propanediol  10 mg Oral Daily  . dextromethorphan-guaiFENesin  1 tablet Oral BID  . digoxin  0.125 mg Oral Daily  . enoxaparin (LOVENOX) injection  60 mg Subcutaneous Q24H  . furosemide  40 mg Intravenous BID  . losartan  12.5 mg Oral QHS  . potassium chloride  40 mEq Oral Q4H  . sodium chloride flush  3 mL Intravenous Q12H  . sodium chloride flush  3 mL Intravenous Q12H  . spironolactone  12.5 mg Oral Daily   Continuous Infusions: .  sodium chloride     PRN Meds:.sodium chloride, acetaminophen **OR** acetaminophen, ondansetron (ZOFRAN) IV, sodium chloride flush  Antimicrobials: Anti-infectives (From admission, onward)   Start     Dose/Rate Route Frequency Ordered Stop   10/23/20 0600  ceFEPIme (MAXIPIME) 2 g in sodium chloride 0.9 % 100 mL IVPB  Status:  Discontinued        2 g 200 mL/hr over 30 Minutes Intravenous Every 8 hours 10/22/20 2257 10/24/20 1250   10/23/20 0600  vancomycin (VANCOREADY) IVPB 1750 mg/350 mL  Status:  Discontinued        1,750 mg 175 mL/hr over 120 Minutes Intravenous Every 8 hours 10/22/20 2259 10/23/20 0922   10/22/20 2345  vancomycin (VANCOREADY) IVPB 2000 mg/400 mL        2,000 mg 200 mL/hr over 120 Minutes Intravenous  Once 10/22/20 2249 10/23/20 0316   10/22/20 2345  ceFEPIme (MAXIPIME) 2 g in sodium chloride 0.9 % 100 mL IVPB        2 g 200 mL/hr over 30 Minutes Intravenous  Once  10/22/20 2249 10/23/20 0039   10/22/20 1700  cefTRIAXone (ROCEPHIN) 2 g in sodium chloride 0.9 % 100 mL IVPB        2 g 200 mL/hr over 30 Minutes Intravenous  Once 10/22/20 1654 10/22/20 1803   10/22/20 1700  doxycycline (VIBRA-TABS) tablet 100 mg        100 mg Oral  Once 10/22/20 1654 10/22/20 1721       I have personally reviewed the following labs and images: CBC: Recent Labs  Lab 10/22/20 1625 10/23/20 0148 10/24/20 0253 10/25/20 0922 10/26/20 0421  WBC 15.1* 15.2* 12.8*  --  9.5  NEUTROABS 12.8* 11.7*  --   --   --   HGB 13.8 13.3 14.1 12.9*  12.9* 13.6  HCT 42.2 41.2 43.7 38.0*  38.0* 41.7  MCV 83.4 85.3 84.7  --  82.6  PLT 312 282 324  --  352   BMP &GFR Recent Labs  Lab 10/22/20 1625 10/22/20 2317 10/23/20 0148 10/24/20 0253 10/25/20 0433 10/25/20 0922 10/26/20 0421  NA 135  --  136 138 135 138  138 134*  K 4.0  --  3.9 3.9 4.4 3.8  3.7 3.1*  CL 105  --  102 105 98  --  97*  CO2 20*  --  25 24 27   --  23  GLUCOSE 96  --  123* 95 105*  --  82  BUN 11  --  10 6 13   --   16  CREATININE 0.99  --  1.04 0.88 1.08  --  1.00  CALCIUM 8.0*  --  8.6* 8.1* 8.6*  --  8.9  MG  --  1.8 1.9 1.7 2.4  --  2.2  PHOS  --   --  3.0  --  4.1  --   --    Estimated Creatinine Clearance: 125.6 mL/min (by C-G formula based on SCr of 1 mg/dL). Liver & Pancreas: Recent Labs  Lab 10/23/20 0148 10/25/20 0433  AST 30 16  ALT 55* 40  ALKPHOS 53 52  BILITOT 1.2 0.9  PROT 5.9* 6.1*  ALBUMIN 2.9* 2.9*   No results for input(s): LIPASE, AMYLASE in the last 168 hours. No results for input(s): AMMONIA in the last 168 hours. Diabetic: Recent Labs    10/25/20 0434  HGBA1C 6.1*   No results for input(s): GLUCAP in the last 168 hours. Cardiac Enzymes: No results for input(s): CKTOTAL, CKMB, CKMBINDEX, TROPONINI in the last 168 hours. No results for input(s): PROBNP in the last 8760 hours. Coagulation Profile: No results for input(s): INR, PROTIME in the last 168 hours. Thyroid Function Tests: No results for input(s): TSH, T4TOTAL, FREET4, T3FREE, THYROIDAB in the last 72 hours. Lipid Profile: Recent Labs    10/25/20 0434  CHOL 159  HDL 30*  LDLCALC 115*  TRIG 68  CHOLHDL 5.3   Anemia Panel: No results for input(s): VITAMINB12, FOLATE, FERRITIN, TIBC, IRON, RETICCTPCT in the last 72 hours. Urine analysis:    Component Value Date/Time   COLORURINE YELLOW 10/22/2020 0229   APPEARANCEUR CLEAR 10/22/2020 0229   LABSPEC 1.013 10/22/2020 0229   PHURINE 5.0 10/22/2020 0229   GLUCOSEU NEGATIVE 10/22/2020 0229   HGBUR NEGATIVE 10/22/2020 0229   BILIRUBINUR NEGATIVE 10/22/2020 0229   KETONESUR 5 (A) 10/22/2020 0229   PROTEINUR NEGATIVE 10/22/2020 0229   NITRITE NEGATIVE 10/22/2020 0229   LEUKOCYTESUR NEGATIVE 10/22/2020 0229   Sepsis Labs: Invalid input(s): PROCALCITONIN, LACTICIDVEN  Microbiology: Recent Results (from the past  240 hour(s))  Resp Panel by RT-PCR (Flu A&B, Covid) Nasopharyngeal Swab     Status: None   Collection Time: 10/22/20  4:25 PM   Specimen:  Nasopharyngeal Swab; Nasopharyngeal(NP) swabs in vial transport medium  Result Value Ref Range Status   SARS Coronavirus 2 by RT PCR NEGATIVE NEGATIVE Final    Comment: (NOTE) SARS-CoV-2 target nucleic acids are NOT DETECTED.  The SARS-CoV-2 RNA is generally detectable in upper respiratory specimens during the acute phase of infection. The lowest concentration of SARS-CoV-2 viral copies this assay can detect is 138 copies/mL. A negative result does not preclude SARS-Cov-2 infection and should not be used as the sole basis for treatment or other patient management decisions. A negative result may occur with  improper specimen collection/handling, submission of specimen other than nasopharyngeal swab, presence of viral mutation(s) within the areas targeted by this assay, and inadequate number of viral copies(<138 copies/mL). A negative result must be combined with clinical observations, patient history, and epidemiological information. The expected result is Negative.  Fact Sheet for Patients:  BloggerCourse.comhttps://www.fda.gov/media/152166/download  Fact Sheet for Healthcare Providers:  SeriousBroker.ithttps://www.fda.gov/media/152162/download  This test is no t yet approved or cleared by the [place] FDA and  has been authorized for detection and/or diagnosis of SARS-CoV-2 by FDA under an Emergency Use Authorization (EUA). This EUA will remain  in effect (meaning this test can be used) for the duration of the COVID-19 declaration under Section 564(b)(1) of the Act, 21 U.S.C.section 360bbb-3(b)(1), unless the authorization is terminated  or revoked sooner.       Influenza A by PCR NEGATIVE NEGATIVE Final   Influenza B by PCR NEGATIVE NEGATIVE Final    Comment: (NOTE) The Xpert Xpress SARS-CoV-2/FLU/RSV plus assay is intended as an aid in the diagnosis of influenza from Nasopharyngeal swab specimens and should not be used as a sole basis for treatment. Nasal washings and aspirates are unacceptable for  Xpert Xpress SARS-CoV-2/FLU/RSV testing.  Fact Sheet for Patients: BloggerCourse.comhttps://www.fda.gov/media/152166/download  Fact Sheet for Healthcare Providers: SeriousBroker.ithttps://www.fda.gov/media/152162/download  This test is not yet approved or cleared by the [place] FDA and has been authorized for detection and/or diagnosis of SARS-CoV-2 by FDA under an Emergency Use Authorization (EUA). This EUA will remain in effect (meaning this test can be used) for the duration of the COVID-19 declaration under Section 564(b)(1) of the Act, 21 U.S.C. section 360bbb-3(b)(1), unless the authorization is terminated or revoked.  Performed at [facility], [address], [place], [place] [address]   Culture, blood (Routine X 2) w Reflex to ID Panel     Status: None (Preliminary result)   Collection Time: 10/22/20 11:17 PM   Specimen: BLOOD  Result Value Ref Range Status   Specimen Description BLOOD LEFT ANTECUBITAL  Final   Special Requests   Final    BOTTLES DRAWN AEROBIC ONLY Blood Culture results may not be optimal due to an inadequate volume of blood received in culture bottles   Culture   Final    NO GROWTH 4 DAYS Performed at [facility], [address] [place], [place] [address]    Report Status PENDING  Incomplete  Culture, blood (Routine X 2) w Reflex to ID Panel     Status: None (Preliminary result)   Collection Time: 10/22/20 11:27 PM   Specimen: BLOOD  Result Value Ref Range Status   Specimen Description BLOOD LEFT ANTECUBITAL  Final   Special Requests   Final    BOTTLES DRAWN AEROBIC ONLY Blood Culture  results may not be optimal due to an inadequate volume of blood received in culture bottles   Culture   Final    NO GROWTH 4 DAYS Performed at [facility], [address] [place], [place] [address]    Report Status PENDING  Incomplete  MRSA PCR Screening     Status: None   Collection Time: 10/23/20  1:57 AM   Specimen: Nasal Mucosa; Nasopharyngeal   Result Value Ref Range Status   MRSA by PCR NEGATIVE NEGATIVE Final    Comment:        The GeneXpert MRSA Assay (FDA approved for NASAL specimens only), is one component of a comprehensive MRSA colonization surveillance program. It is not intended to diagnose MRSA infection nor to guide or monitor treatment for MRSA infections. Performed at [facility], [address] [place], [place] [address]   Expectorated Sputum Assessment w Gram Stain, Rflx to Resp Cult     Status: None   Collection Time: 10/23/20  5:11 AM   Specimen: Sputum  Result Value Ref Range Status   Specimen Description SPUTUM  Final   Special Requests NONE  Final   Sputum evaluation   Final    Sputum specimen not acceptable for testing.  Please recollect.   RESULT CALLED TO, READ BACK BY AND VERIFIED WITH: RN B.YAP ON [id number] AT 1424 BY E.PARRISH Performed at [facility], [address], [place], [place] [address]    Report Status 10/23/2020 FINAL  Final  C Difficile Quick Screen w PCR reflex     Status: None   Collection Time: 10/24/20 11:48 AM   Specimen: STOOL  Result Value Ref Range Status   C Diff antigen NEGATIVE NEGATIVE Final   C Diff toxin NEGATIVE NEGATIVE Final   C Diff interpretation No C. difficile detected.  Final    Comment: Performed at [facility], [address] [place], [place] [address]  Surgical pcr screen     Status: None   Collection Time: 10/24/20 10:12 PM   Specimen: Nasal Mucosa; Nasal Swab  Result Value Ref Range Status   MRSA, PCR NEGATIVE NEGATIVE Final   Staphylococcus aureus NEGATIVE NEGATIVE Final    Comment: (NOTE) The Xpert SA Assay (FDA approved for NASAL specimens in patients 34 years of age and older), is one component of a comprehensive surveillance program. It is not intended to diagnose infection nor to guide or monitor treatment. Performed at [facility], [address], [place], [place] [address]     Radiology Studies: No  results found.    Dior Dominik T. Daney [facility]  If 7PM-7AM, please contact night-coverage [email or web address] 10/26/2020, 11:11 AM

## 2020-10-27 ENCOUNTER — Other Ambulatory Visit (HOSPITAL_COMMUNITY): Payer: Self-pay

## 2020-10-27 DIAGNOSIS — F419 Anxiety disorder, unspecified: Secondary | ICD-10-CM

## 2020-10-27 LAB — BASIC METABOLIC PANEL
Anion gap: 9 (ref 5–15)
BUN: 19 mg/dL (ref 6–20)
CO2: 29 mmol/L (ref 22–32)
Calcium: 9.4 mg/dL (ref 8.9–10.3)
Chloride: 98 mmol/L (ref 98–111)
Creatinine, Ser: 1.26 mg/dL — ABNORMAL HIGH (ref 0.61–1.24)
GFR, Estimated: >60 mL/min (ref >60)
Glucose, Bld: 100 mg/dL — ABNORMAL HIGH (ref 70–99)
Potassium: 4.5 mmol/L (ref 3.5–5.1)
Sodium: 136 mmol/L (ref 135–145)

## 2020-10-27 LAB — MAGNESIUM: Magnesium: 2.2 mg/dL (ref 1.7–2.4)

## 2020-10-27 LAB — CBC
HCT: 42.6 % (ref 39.0–52.0)
Hemoglobin: 14.1 g/dL (ref 13.0–17.0)
MCH: 27.3 pg (ref 26.0–34.0)
MCHC: 33.1 g/dL (ref 30.0–36.0)
MCV: 82.6 fL (ref 80.0–100.0)
Platelets: 418 10*3/uL — ABNORMAL HIGH (ref 150–400)
RBC: 5.16 MIL/uL (ref 4.22–5.81)
RDW: 12.9 % (ref 11.5–15.5)
WBC: 10.6 10*3/uL — ABNORMAL HIGH (ref 4.0–10.5)
nRBC: 0 % (ref 0.0–0.2)

## 2020-10-27 LAB — CULTURE, BLOOD (ROUTINE X 2)
Culture: NO GROWTH
Culture: NO GROWTH

## 2020-10-27 LAB — HEMOGLOBIN A1C
Hgb A1c MFr Bld: 6.1 % — ABNORMAL HIGH (ref 4.8–5.6)
Mean Plasma Glucose: 128 mg/dL

## 2020-10-27 LAB — PROCALCITONIN: Procalcitonin: <0.1 ng/mL

## 2020-10-27 MED ORDER — SERTRALINE HCL 50 MG PO TABS
25.0000 mg | ORAL_TABLET | Freq: Every day | ORAL | 1 refills | Status: DC
  Filled 2020-10-27: qty 15, 30d supply, fill #0

## 2020-10-27 MED ORDER — SPIRONOLACTONE 25 MG PO TABS
25.0000 mg | ORAL_TABLET | Freq: Every day | ORAL | 1 refills | Status: DC
  Filled 2020-10-27: qty 30, 30d supply, fill #0

## 2020-10-27 MED ORDER — FUROSEMIDE 40 MG PO TABS
40.0000 mg | ORAL_TABLET | Freq: Every day | ORAL | Status: DC
  Administered 2020-10-27: 40 mg via ORAL
  Filled 2020-10-27: qty 1

## 2020-10-27 MED ORDER — DAPAGLIFLOZIN PROPANEDIOL 10 MG PO TABS
10.0000 mg | ORAL_TABLET | Freq: Every day | ORAL | 1 refills | Status: DC
  Filled 2020-10-27: qty 30, 30d supply, fill #0

## 2020-10-27 MED ORDER — AMIODARONE HCL 200 MG PO TABS
200.0000 mg | ORAL_TABLET | Freq: Two times a day (BID) | ORAL | Status: DC
  Administered 2020-10-27: 200 mg via ORAL
  Filled 2020-10-27: qty 1

## 2020-10-27 MED ORDER — FUROSEMIDE 40 MG PO TABS
40.0000 mg | ORAL_TABLET | Freq: Every day | ORAL | 1 refills | Status: DC
  Filled 2020-10-27: qty 30, 30d supply, fill #0

## 2020-10-27 MED ORDER — DIGOXIN 125 MCG PO TABS
0.1250 mg | ORAL_TABLET | Freq: Every day | ORAL | 1 refills | Status: DC
  Filled 2020-10-27: qty 30, 30d supply, fill #0

## 2020-10-27 MED ORDER — AMIODARONE HCL 200 MG PO TABS
200.0000 mg | ORAL_TABLET | Freq: Two times a day (BID) | ORAL | 1 refills | Status: DC
Start: 2020-10-27 — End: 2021-02-15
  Filled 2020-10-27: qty 60, 30d supply, fill #0
  Filled 2020-11-23: qty 60, 30d supply, fill #1

## 2020-10-27 MED ORDER — SERTRALINE HCL 50 MG PO TABS
25.0000 mg | ORAL_TABLET | Freq: Every day | ORAL | Status: DC
  Administered 2020-10-27: 25 mg via ORAL
  Filled 2020-10-27: qty 1

## 2020-10-27 MED ORDER — CARVEDILOL 3.125 MG PO TABS
3.1250 mg | ORAL_TABLET | Freq: Two times a day (BID) | ORAL | 1 refills | Status: DC
  Filled 2020-10-27: qty 60, 30d supply, fill #0

## 2020-10-27 MED ORDER — LOSARTAN POTASSIUM 25 MG PO TABS
12.5000 mg | ORAL_TABLET | Freq: Every day | ORAL | 1 refills | Status: DC
  Filled 2020-10-27: qty 15, 30d supply, fill #0

## 2020-10-27 MED ORDER — BUSPIRONE HCL 5 MG PO TABS
5.0000 mg | ORAL_TABLET | Freq: Three times a day (TID) | ORAL | 1 refills | Status: DC
  Filled 2020-10-27: qty 90, 30d supply, fill #0

## 2020-10-27 NOTE — Progress Notes (Addendum)
Patient ID: David Vang, male   DOB: 01-30-1991, 30 y.o.   MRN: [id number]     Advanced Heart Failure Rounding Note  PCP-Cardiologist: None   Subjective:    NICM. LHC w/ normal cors. RHC w/ elevated R/LHC filling pressures, mod PH and low cardiac output (CI 2.2 thermo, 1.9 Fick)  cMRI - LVEF 21% RV EF 35%     Denies SOB. Walked in the hall without difficulty.    Objective:   Weight Range: 103.4 kg Body mass index is 35.69 kg/m.   Vital Signs:   Temp:  [97.7 F (36.5 C)-98.3 F (36.8 C)] 98 F (36.7 C) (06/03 0714) Pulse Rate:  [53-110] 87 (06/03 0714) Resp:  [16-18] 18 (06/03 0714) BP: (98-120)/(48-75) 98/68 (06/03 0714) SpO2:  [94 %-99 %] 95 % (06/03 0714) Weight:  [103.4 kg] 103.4 kg (06/03 0130) Last BM Date: 10/26/20  Weight change: Filed Weights   10/22/20 1539 10/26/20 0351 10/27/20 0130  Weight: 122.5 kg 104.6 kg 103.4 kg    Intake/Output:   Intake/Output Summary (Last 24 hours) at 10/27/2020 0815 Last data filed at 10/26/2020 1857 Gross per 24 hour  Intake 717 ml  Output 1000 ml  Net -283 ml      Physical Exam    General:  Well appearing. No resp difficulty HEENT: normal Neck: supple. no JVD. Carotids 2+ bilat; no bruits. No lymphadenopathy or thryomegaly appreciated. Cor: PMI nondisplaced. Regular rate & rhythm. No rubs, gallops or murmurs. Has Life Vest on.  Lungs: clear Abdomen: soft, nontender, nondistended. No hepatosplenomegaly. No bruits or masses. Good bowel sounds. Extremities: no cyanosis, clubbing, rash, edema Neuro: alert & orientedx3, cranial nerves grossly intact. moves all 4 extremities w/o difficulty. Affect pleasant   Telemetry   SR with occasional PVCs.   Labs    CBC Recent Labs    10/26/20 0421 10/27/20 0407  WBC 9.5 10.6*  HGB 13.6 14.1  HCT 41.7 42.6  MCV 82.6 82.6  PLT 352 418*   Basic Metabolic Panel Recent Labs    21/19/41 0433 10/25/20 0922 10/26/20 0421 10/27/20 0407  NA 135   < > 134* 136  K  4.4   < > 3.1* 4.5  CL 98  --  97* 98  CO2 27  --  23 29  GLUCOSE 105*  --  82 100*  BUN 13  --  16 19  CREATININE 1.08  --  1.00 1.26*  CALCIUM 8.6*  --  8.9 9.4  MG 2.4  --  2.2 2.2  PHOS 4.1  --   --   --    < > = values in this interval not displayed.   Liver Function Tests Recent Labs    10/25/20 0433  AST 16  ALT 40  ALKPHOS 52  BILITOT 0.9  PROT 6.1*  ALBUMIN 2.9*   No results for input(s): LIPASE, AMYLASE in the last 72 hours. Cardiac Enzymes No results for input(s): CKTOTAL, CKMB, CKMBINDEX, TROPONINI in the last 72 hours.  BNP: BNP (last 3 results) Recent Labs    10/22/20 1625 10/25/20 0434  BNP 183.1* 315.9*    ProBNP (last 3 results) No results for input(s): PROBNP in the last 8760 hours.   D-Dimer No results for input(s): DDIMER in the last 72 hours. Hemoglobin A1C Recent Labs    10/26/20 0421  HGBA1C 6.1*   Fasting Lipid Panel Recent Labs    10/25/20 0434  CHOL 159  HDL 30*  LDLCALC 115*  TRIG 68  CHOLHDL 5.3   Thyroid Function Tests No results for input(s): TSH, T4TOTAL, T3FREE, THYROIDAB in the last 72 hours.  Invalid input(s): FREET3  Other results:   Imaging    No results found.   Medications:     Scheduled Medications: . carvedilol  3.125 mg Oral BID WC  . dapagliflozin propanediol  10 mg Oral Daily  . dextromethorphan-guaiFENesin  1 tablet Oral BID  . digoxin  0.125 mg Oral Daily  . enoxaparin (LOVENOX) injection  55 mg Subcutaneous Q24H  . furosemide  40 mg Intravenous BID  . losartan  12.5 mg Oral QHS  . sodium chloride flush  3 mL Intravenous Q12H  . sodium chloride flush  3 mL Intravenous Q12H  . spironolactone  25 mg Oral Daily    Infusions: . sodium chloride      PRN Medications: sodium chloride, acetaminophen **OR** acetaminophen, benzonatate, ondansetron (ZOFRAN) IV, sodium chloride flush    Assessment/Plan   1. Acute systolic CHF:  Newly found cardiomyopathy in patient with minimal past  history, but father had LVAD placed for presumed NICM in his 30s/40s (has passed away). Echo showed EF 20-25%, no LVH, mild-moderate MR, normal RV.  No ETOH or drugs.  RHC/LHC with no coronary disease, moderately elevated filling pressures and low cardiac output (2.2 thermo, 1.9 Fick).  Patient also noted to have very frequent PVCs.  Possible causes of CMP include PVC-mediated, viral myocarditis (URI-type symptoms recently), or familial CMP.   CMRI -  LVEF 21%.  -Volume status stable. Add lasix 40 mg daily.  - Continue losartan 12.5 mg daily (no BP room yet for Entresto)  - Continue spironolactone to 25 daily.  - Continue Farxiga 10 mg daily. - Continue digoxin 0.125 daily.  - Cardiac MRI for infiltrative disease/myocarditis.   - Genetic testing for cardiomyopathy as outpatient given family history.  - Supect OSA, outpatient sleep study.  - Life Vest in the room.  2. PVCs: Frequent, often bigeminal.  Mexiletine tried without much effect. 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  Discussed with Dr. Graciela Husbands.  - Start on amiodarone 200 mg bid.  Not ideal long-term medication for him, but if LV function can improve some over time may be able to stop.  - EP appreciated. Candidate for possible ablation but will need insurance. SW working on this.  - Continue coreg 3.125 mg twice a day. - Discussed with EP. He has follow up in 6 weeks with Dr Lalla Brothers.    HFSW following. Set up for food stamps and started process for insurance.  Can go home today . Has Life Vest on.   HF meds for d/c Lasix 40 mg daily  Coreg 3.125 mg twice a day  Losartan 12.5 mg daily  Spironolactone 25 mg daily  Farxiga 10 mg daily Digoxin 0.125 mg daily  Amio 200 mg twice a day  Also add sertraline 25 mg daily for anxiety  Length of Stay: 5  [name], NP  10/27/2020, 8:15 AM  Advanced Heart Failure Team Pager [phone] (M-F; 7a - 5p)   Please contact [facility] for night-coverage after hours (5p -7a ) and weekends on [email or web address]  Patient seen with NP, agree with the above note.    He is doing well today, no dyspnea.  Walking in halls.   General: NAD Neck: No JVD, no thyromegaly or thyroid nodule.  Lungs: Clear to auscultation bilaterally with normal respiratory  effort. CV: Nondisplaced PMI.  Heart regular S1/S2, no S3/S4, no murmur.  No peripheral edema.   Abdomen: Soft, nontender, no hepatosplenomegaly, no distention.  Skin: Intact without lesions or rashes.  Neurologic: Alert and oriented x 3.  Psych: Normal affect. Extremities: No clubbing or cyanosis.  HEENT: Normal.   I think he is ready to go home today.  See regimen for medications above.  Will schedule CHF clinic and EP followup.   He will need genetic testing for hereditary cardiomyopathies as outpatient.    Discussed PVCs with Dr. Lalla Brothers this morning.  Will send him home on amiodarone, he will followup with EP.  In 3-6 months after titration of GDMT, will aim for PVC ablation.  We will work on getting him insurance prior. He will also go home on Lifevest and will need ICD if EF is not improved on followup echo in 6 months or so.   Patient with significant anxiety, will add sertraline for home.  Needs to find PCP.   Marca Ancona 10/27/2020 8:55 AM

## 2020-10-27 NOTE — Discharge Summary (Signed)
Physician Discharge Summary  David Vang:[id number] DOB: 1991/05/03 DOA: 10/22/2020  PCP: Pcp, No  Admit date: 10/22/2020 Discharge date: 10/27/2020  Admitted From: Home Disposition: Home  Recommendations for Outpatient Follow-up:  1. Follow ups as below. 2. Please obtain CBC/BMP/Mag at follow up 3. Recommend outpatient sleep study to rule out sleep apnea 4. Please follow up on the following pending results: None  Home Health: None required Equipment/Devices: None required  Discharge Condition: Stable CODE STATUS: Full code   Follow-up Information    [facility] Follow up.   Specialty: Cardiology Why: November 15, 2020 at 9:30 AM. [facility]. David Vang Garage Code [phone] Contact information: [address] [id number] mc [place] [address] [phone]       [facility] Follow up.   Why: Hospital follow up scheduled in July. Patient's wife David Vang has details about exact date and time. Contact information: [address] [place] [address] [phone]               Hospital Course: 30 year old M with PMH of morbid obesity and "recurrent pneumonia" presented to ED with shortness of breath and admitted with concern for "sepsis due to bilateral pneumonia". Reportedly, he was treated outpatient with oral antibiotic and steroids 2 weeks prior to presentation.   Patient had mild temp to 99.6 and leukocytosis to 15.2.  Lactic acid was within normal.  BNP, troponin and D-dimer were marginally elevated.  EKG with ventricular bigeminy.  CTA chest negative for PE but extensive airspace lung opacities, more evident on the right, with borderline cardiomegaly and a trace right pleural effusion concerning for asymmetric pulmonary edema or multifocal pneumonia.  COVID-19 and influenza PCR were negative.  His  procalcitonin was negative.  He was started on broad-spectrum antibiotics and admitted for community-acquired pneumonia.   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  Repeat procalcitonin remain negative.  Antibiotics discontinued.    Advanced heart failure team consulted.  R/LHC on 6/1 with elevated right and left heart filling pressures, moderate pulmonary venous hypertension, low cardiac output but no CAD. cMRI with extensive airspace disease, right>left likely from recent pneumonia.  Patient was started on diuretics and GDMT.  He was also started on amiodarone and fitted with LifeVest for his PVCs.    On the day of discharge, patient felt well and ready to go home.  He ambulated in the hallway multiple times without distress.  He was cleared for discharge home on cardiac medications as below.  Outpatient follow-up arranged by cardiology and electrophysiology.  See individual problem list below for more on hospital course.  Discharge Diagnoses:  Acute combined CHF/cardiomyopathy: Likely cause of his respiratory symptoms refractory to antibiotics.  He could have viral respiratory infections.  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  Significant family history.  His TSH and HIV was within normal.  EKG with ventricular bigeminy. R/LHC and cMRI as above.  Inflammatory markers elevated.  -Cleared for discharge by advanced heart failure team on Lasix 40 mg daily  Coreg 3.125 mg twice a day  Losartan 12.5 mg daily  Spironolactone 25 mg daily  Farxiga 10 mg daily Digoxin 0.125 mg daily  Amio 200 mg twice a day  Also add sertraline 25 mg daily for anxiety Over his prescriptions filled at [facility]  pharmacy. Counseled on sodium and fluid restrictions Recommend outpatient sleep study to rule out OSA  PVCs-frequent bigeminy.  TSH within normal. -Discharged on amiodarone and  LifeVest  Hyponatremia/hypokalemia-resolved  Community-acquired pneumonia-Ruled out.  Procalcitonin remain negative x3.  MRI finding likely residual from recent pneumonia. -Discontinued antibiotics  Diarrhea-Resolved.  C. difficile negative.  Anxiety: stable -Zoloft per cardiology -Added BuSpar as well  Leukocytosis/bandemia: Likely demargination from recent steroid versus bacterial infection.   Class II obesity Body mass index is 35.69 kg/m.  -Encourage lifestyle change to lose weight.          Discharge Exam: Vitals:   10/27/20 0714 10/27/20 0855  BP: 98/68 110/78  Pulse: 87 (!) 107  Resp: 18   Temp: 98 F (36.7 C)   SpO2: 95%     GENERAL: No apparent distress.  Nontoxic. HEENT: MMM.  Vision and hearing grossly intact.  NECK: Supple.  No apparent JVD but difficult to assess.  RESP: On RA.  No IWOB.  Fair aeration bilaterally. CVS: Irregular rhythm.  Normal rate.  Heart sounds normal.  ABD/GI/GU: Bowel sounds present. Soft. Non tender.  MSK/EXT:  Moves extremities. No apparent deformity. No edema.  SKIN: no apparent skin lesion or wound NEURO: Awake, alert and oriented appropriately.  No apparent focal neuro deficit. PSYCH: Calm. Normal affect.  Discharge Instructions  Discharge Instructions    (HEART FAILURE PATIENTS) Call MD:  Anytime you have any of the following symptoms: 1) 3 pound weight gain in 24 hours or 5 pounds in 1 week 2) shortness of breath, with or without a dry hacking cough 3) swelling in the hands, feet or stomach 4) if you have to sleep on extra pillows at night in order to breathe.   Complete by: As directed    Call MD for:  difficulty breathing, headache or visual disturbances   Complete by: As directed    Call MD for:  extreme fatigue   Complete by: As directed    Call MD for:  persistant dizziness or light-headedness   Complete by: As directed    Diet - low sodium heart healthy   Complete by: As directed    Discharge instructions    Complete by: As directed    It has been a pleasure taking care of you! Your were hospitalized with shortness of breath and cough and found to have heart failure and abnormal heart rhythm. You have been started on medications for both conditions. Please take all your medications as prescribed.   Cardiologist will arrange outpatient follow up for further evaluation and care.  In addition to taking your medications as prescribed, we also recommend you avoid alcohol or over-the-counter pain medication other than plain Tylenol, limit the amount of water/fluid you drink to less than 6 cups (1500 cc) a day,  limit your sodium (salt) intake to less than 2 g (2000 mg) a day and weigh yourself daily at the same time and keeping your weight log.   Please review your new medication list and the directions before you take your medications.  Take care,   Increase activity slowly   Complete by: As directed      Allergies as of 10/27/2020      Reactions   Benadryl [diphenhydramine] Other (See Comments)   Pass out      Medication List    STOP taking these medications   albuterol 108 (90 Base) MCG/ACT inhaler Commonly known as: VENTOLIN HFA   ibuprofen 200 MG tablet Commonly known as:  ADVIL   levofloxacin 500 MG tablet Commonly known as: LEVAQUIN     TAKE these medications   amiodarone 200 MG tablet Commonly known as: PACERONE Take 1 tablet (200 mg total) by mouth 2 (two) times daily.   busPIRone 5 MG tablet Commonly known as: BUSPAR Take 1 tablet (5 mg total) by mouth 3 (three) times daily.   carvedilol 3.125 MG tablet Commonly known as: COREG Take 1 tablet (3.125 mg total) by mouth 2 (two) times daily with a meal.   digoxin 0.125 MG tablet Commonly known as: LANOXIN Take 1 tablet (0.125 mg total) by mouth daily. Start taking on: October 28, 2020   Farxiga 10 MG Tabs tablet Generic drug: dapagliflozin propanediol Take 1 tablet (10 mg total) by mouth daily. Start taking on: October 28, 2020   furosemide 40 MG tablet Commonly known as: LASIX Take 1 tablet (40 mg total) by mouth daily. Start taking on: October 28, 2020   losartan 25 MG tablet Commonly known as: COZAAR Take 1/2 tablet (12.5 mg total) by mouth at bedtime.   sertraline 50 MG tablet Commonly known as: ZOLOFT Take 1/2 tablet (25 mg total) by mouth daily. Start taking on: October 28, 2020   spironolactone 25 MG tablet Commonly known as: ALDACTONE Take 1 tablet (25 mg total) by mouth daily. Start taking on: October 28, 2020       Consultations:  Cardiology  Electrophysiology  Procedures/Studies:  6/1-R/LHC with elevated right and left heart filling pressures, moderate pulmonary venous hypertension, low cardiac output but no CAD.   CT Angio Chest PE W and/or Wo Contrast  Result Date: 10/22/2020 CLINICAL DATA:  Worsening shortness of breath over the last 2 weeks. Diagnosed with pneumonia 2 weeks ago. Patient has been on antibiotics without relief. EXAM: CT ANGIOGRAPHY CHEST WITH CONTRAST TECHNIQUE: Multidetector CT imaging of the chest was performed using the standard protocol during bolus administration of intravenous contrast. Multiplanar CT image reconstructions and MIPs were obtained to evaluate the vascular anatomy. CONTRAST:  OMNIPAQUE IOHEXOL 350 MG/ML SOLN COMPARISON:  Current chest radiograph. FINDINGS: Cardiovascular: Pulmonary arteries are well opacified. There is no evidence of a pulmonary embolism. Heart is borderline to mildly enlarged. No pericardial effusion. Great vessels are normal in caliber. Mediastinum/Nodes: Visualized thyroid is unremarkable. There are shotty prominent to mildly enlarged mediastinal and right hilar lymph nodes. A superior right paratracheal lymph node measures 1.4 cm in short axis. A probable conglomeration of right paratracheal lymph nodes just above the azygos arch measured 2 cm in short axis. An enlarged prevascular node measures 1.1 cm short axis. A right superior  hilar node measures 1.6 cm in short axis. Trachea and esophagus are unremarkable. Lungs/Pleura: Trace right pleural effusion. Bilateral airspace opacities which predominate on the right, noted in all lobes, with similar appearing consolidation in the left lower lobe, sparing the left upper lobe. No interstitial thickening. No pneumothorax. Upper Abdomen: Decreased attenuation of the liver consistent with fatty infiltration. Otherwise unremarkable. Musculoskeletal: No chest wall abnormality. No acute or significant osseous findings. Review of the MIP images confirms the above findings. IMPRESSION: 1. No evidence of a pulmonary embolism. 2. Extensive airspace lung opacities, more evident on the right, as detailed above. Patient has borderline cardiomegaly and a trace right pleural effusion. Findings may reflect asymmetric pulmonary edema or multifocal pneumonia. Electronically Signed   By: Amie Portland M.D.   On: 10/22/2020 17:48   CARDIAC CATHETERIZATION  Result Date: 10/25/2020 1. Elevated right and left heart filling  pressures. 2. Moderate pulmonary venous hypertension. 3. Low cardiac output. 4. No coronary disease. Nonischemic cardiomyopathy.   DG Chest Portable 1 View  Result Date: 10/22/2020 CLINICAL DATA:  30 year old male with shortness of breath. Evaluate for pneumonia. EXAM: PORTABLE CHEST 1 VIEW COMPARISON:  None. FINDINGS: There is mild cardiomegaly with mild vascular congestion. Bilateral mid to lower lung field densities, right greater than left may represent edema or pneumonia. No large pleural effusion. No pneumothorax. No acute osseous pathology. IMPRESSION: 1. Cardiomegaly with mild vascular congestion. 2. Bilateral mid to lower lung field densities, right greater than left may represent edema or pneumonia. Electronically Signed   By: Elgie Collard M.D.   On: 10/22/2020 16:07   MR CARDIAC MORPHOLOGY W WO CONTRAST  Result Date: 10/26/2020 CLINICAL DATA:  Cardiomyopathy of uncertain  etiology EXAM: CARDIAC MRI TECHNIQUE: The patient was scanned on a 1.5 Tesla GE magnet. A dedicated cardiac coil was used. Functional imaging was done using Fiesta sequences. 2,3, and 4 chamber views were done to assess for RWMA's. Modified Simpson's rule using a short axis stack was used to calculate an ejection fraction on a dedicated work [employer]. The patient received 8 cc of Gadavist. After 10 minutes inversion recovery sequences were used to assess for infiltration and scar tissue. FINDINGS: Extensive airspace disease right lung and left lung base. Small pericardial effusion. Severe left ventricular dilation with normal wall thickness. No evidence for LV noncompaction. Diffuse LV hypokinesis, worse in the septum. LV EF 21%. Mildly dilated right ventricle with mildlly decreased systolic function, EF 35%. Moderate left atrial enlargement. Mild right atrial enlargement. Mitral regurgitation appears moderate and central (functional). Trileaflet aortic valve with no stenosis or regurgitation. On delayed enhancement imaging, there is a small area of late gadolinium enhancement (LGE) in the mid-wall at the inferior RV insertion site. Measurements: LVEDV 349 mL LVSV 73 mL LVEF 21% RVEDV 210 mL RVSV 73 mL RVEF 35% IMPRESSION: 1.  Extensive airspace disease R>L, concern for PNA. 2. Severe LV dilation with diffuse hypokinesis worse in the septum, EF 21%. 3.  Mildly dilated RV with EF 35%. 4. RV insertion site LGE, this is nonspecific and suggesive of RV pressure/volume overload. Dalton Mclean Electronically Signed   By: Marca Ancona M.D.   On: 10/26/2020 19:04   ECHOCARDIOGRAM COMPLETE  Result Date: 10/23/2020    ECHOCARDIOGRAM REPORT   Patient Name:   David Vang Date of Exam: 10/23/2020 Medical Rec #:  [id number]        Height:       67.0 in Accession #:    [id number]       Weight:       270.0 lb Date of Birth:  27-Aug-1990       BSA:          2.297 m Patient Age:    29 years         BP:            120/60 mmHg Patient Gender: M                HR:           60 bpm. Exam Location:  Inpatient Procedure: 2D Echo, Cardiac Doppler, Color Doppler and Intracardiac            Opacification Agent Indications:    I50.31 Acute diastolic (congestive) heart failure  History:        Patient has no prior history of Echocardiogram examinations.  Risk Factors:Family history of heart disease. Bilateral                 pneumonia. 5th time having pneumonia.  Sonographer:    Roosvelt Maser RDCS Referring Phys: [id number] Angie Fava IMPRESSIONS  1. Left ventricular ejection fraction, by estimation, is 20 to 25%. The left ventricle has severely decreased function. The left ventricle demonstrates global hypokinesis. The left ventricular internal cavity size was moderately dilated. Left ventricular diastolic parameters are consistent with Grade II diastolic dysfunction (pseudonormalization). Elevated left atrial pressure.  2. Right ventricular systolic function is normal. The right ventricular size is normal.  3. The mitral valve is normal in structure. Mild to moderate mitral valve regurgitation. No evidence of mitral stenosis.  4. The aortic valve is normal in structure. Aortic valve regurgitation is not visualized. No aortic stenosis is present.  5. The inferior vena cava is normal in size with greater than 50% respiratory variability, suggesting right atrial pressure of 3 mmHg. FINDINGS  Left Ventricle: Left ventricular ejection fraction, by estimation, is 20 to 25%. The left ventricle has severely decreased function. The left ventricle demonstrates global hypokinesis. Definity contrast agent was given IV to delineate the left ventricular endocardial borders. The left ventricular internal cavity size was moderately dilated. There is no left ventricular hypertrophy. Left ventricular diastolic parameters are consistent with Grade II diastolic dysfunction (pseudonormalization). Elevated left atrial pressure.  Right Ventricle: The right ventricular size is normal. No increase in right ventricular wall thickness. Right ventricular systolic function is normal. Left Atrium: Left atrial size was normal in size. Right Atrium: Right atrial size was normal in size. Pericardium: There is no evidence of pericardial effusion. Mitral Valve: The mitral valve is normal in structure. Mild to moderate mitral valve regurgitation. No evidence of mitral valve stenosis. Tricuspid Valve: The tricuspid valve is normal in structure. Tricuspid valve regurgitation is mild . No evidence of tricuspid stenosis. Aortic Valve: The aortic valve is normal in structure. Aortic valve regurgitation is not visualized. No aortic stenosis is present. Aortic valve mean gradient measures 5.0 mmHg. Aortic valve peak gradient measures 8.9 mmHg. Aortic valve area, by VTI measures 0.93 cm. Pulmonic Valve: The pulmonic valve was normal in structure. Pulmonic valve regurgitation is not visualized. No evidence of pulmonic stenosis. Aorta: The aortic root is normal in size and structure. Venous: The inferior vena cava is normal in size with greater than 50% respiratory variability, suggesting right atrial pressure of 3 mmHg. IAS/Shunts: No atrial level shunt detected by color flow Doppler.  LEFT VENTRICLE PLAX 2D LVIDd:         6.60 cm  Diastology LVIDs:         6.40 cm  LV e' medial:    11.00 cm/s LV PW:         0.80 cm  LV E/e' medial:  14.7 LV IVS:        0.70 cm  LV e' lateral:   9.68 cm/s LVOT diam:     1.80 cm  LV E/e' lateral: 16.7 LV SV:         23 LV SV Index:   10 LVOT Area:     2.54 cm  RIGHT VENTRICLE             IVC RV Basal diam:  3.70 cm     IVC diam: 1.80 cm RV S prime:     14.70 cm/s TAPSE (M-mode): 3.0 cm LEFT ATRIUM  Index       RIGHT ATRIUM           Index LA diam:        4.60 cm 2.00 cm/m  RA Area:     13.30 cm LA Vol (A2C):   60.4 ml 26.29 ml/m RA Volume:   32.50 ml  14.15 ml/m LA Vol (A4C):   32.6 ml 14.19 ml/m LA Biplane Vol:  47.1 ml 20.50 ml/m  AORTIC VALVE AV Area (Vmax):    0.94 cm AV Area (Vmean):   0.95 cm AV Area (VTI):     0.93 cm AV Vmax:           149.00 cm/s AV Vmean:          105.000 cm/s AV VTI:            0.247 m AV Peak Grad:      8.9 mmHg AV Mean Grad:      5.0 mmHg LVOT Vmax:         54.90 cm/s LVOT Vmean:        39.400 cm/s LVOT VTI:          0.090 m LVOT/AV VTI ratio: 0.37  AORTA Ao Root diam: 3.00 cm Ao Asc diam:  2.50 cm MITRAL VALVE MV Area (PHT): 7.74 cm     SHUNTS MV Decel Time: 98 msec      Systemic VTI:  0.09 m MV E velocity: 162.00 cm/s  Systemic Diam: 1.80 cm MV A velocity: 93.70 cm/s MV E/A ratio:  1.73 Donato Schultz MD Electronically signed by Donato Schultz MD Signature Date/Time: 10/23/2020/11:12:33 AM    Final         The results of significant diagnostics from this hospitalization (including imaging, microbiology, ancillary and laboratory) are listed below for reference.     Microbiology: Recent Results (from the past 240 hour(s))  Resp Panel by RT-PCR (Flu A&B, Covid) Nasopharyngeal Swab     Status: None   Collection Time: 10/22/20  4:25 PM   Specimen: Nasopharyngeal Swab; Nasopharyngeal(NP) swabs in vial transport medium  Result Value Ref Range Status   SARS Coronavirus 2 by RT PCR NEGATIVE NEGATIVE Final    Comment: (NOTE) SARS-CoV-2 target nucleic acids are NOT DETECTED.  The SARS-CoV-2 RNA is generally detectable in upper respiratory specimens during the acute phase of infection. The lowest concentration of SARS-CoV-2 viral copies this assay can detect is 138 copies/mL. A negative result does not preclude SARS-Cov-2 infection and should not be used as the sole basis for treatment or other patient management decisions. A negative result may occur with  improper specimen collection/handling, submission of specimen other than nasopharyngeal swab, presence of viral mutation(s) within the areas targeted by this assay, and inadequate number of viral copies(<138 copies/mL). A  negative result must be combined with clinical observations, patient history, and epidemiological information. The expected result is Negative.  Fact Sheet for Patients:  BloggerCourse.com  Fact Sheet for Healthcare Providers:  SeriousBroker.it  This test is no t yet approved or cleared by the [place] FDA and  has been authorized for detection and/or diagnosis of SARS-CoV-2 by FDA under an Emergency Use Authorization (EUA). This EUA will remain  in effect (meaning this test can be used) for the duration of the COVID-19 declaration under Section 564(b)(1) of the Act, 21 U.S.C.section 360bbb-3(b)(1), unless the authorization is terminated  or revoked sooner.       Influenza A by PCR NEGATIVE NEGATIVE Final   Influenza B  by PCR NEGATIVE NEGATIVE Final    Comment: (NOTE) The Xpert Xpress SARS-CoV-2/FLU/RSV plus assay is intended as an aid in the diagnosis of influenza from Nasopharyngeal swab specimens and should not be used as a sole basis for treatment. Nasal washings and aspirates are unacceptable for Xpert Xpress SARS-CoV-2/FLU/RSV testing.  Fact Sheet for Patients: BloggerCourse.com  Fact Sheet for Healthcare Providers: SeriousBroker.it  This test is not yet approved or cleared by the [place] FDA and has been authorized for detection and/or diagnosis of SARS-CoV-2 by FDA under an Emergency Use Authorization (EUA). This EUA will remain in effect (meaning this test can be used) for the duration of the COVID-19 declaration under Section 564(b)(1) of the Act, 21 U.S.C. section 360bbb-3(b)(1), unless the authorization is terminated or revoked.  Performed at [facility], [address], [place], [place] [address]   Culture, blood (Routine X 2) w Reflex to ID Panel     Status: None   Collection Time: 10/22/20 11:17 PM   Specimen: BLOOD  Result  Value Ref Range Status   Specimen Description BLOOD LEFT ANTECUBITAL  Final   Special Requests   Final    BOTTLES DRAWN AEROBIC ONLY Blood Culture results may not be optimal due to an inadequate volume of blood received in culture bottles   Culture   Final    NO GROWTH 5 DAYS Performed at [facility], [address], [place], [place] [address]    Report Status 10/27/2020 FINAL  Final  Culture, blood (Routine X 2) w Reflex to ID Panel     Status: None   Collection Time: 10/22/20 11:27 PM   Specimen: BLOOD  Result Value Ref Range Status   Specimen Description BLOOD LEFT ANTECUBITAL  Final   Special Requests   Final    BOTTLES DRAWN AEROBIC ONLY Blood Culture results may not be optimal due to an inadequate volume of blood received in culture bottles   Culture   Final    NO GROWTH 5 DAYS Performed at [facility], [address] [place], [place] [address]    Report Status 10/27/2020 FINAL  Final  MRSA PCR Screening     Status: None   Collection Time: 10/23/20  1:57 AM   Specimen: Nasal Mucosa; Nasopharyngeal  Result Value Ref Range Status   MRSA by PCR NEGATIVE NEGATIVE Final    Comment:        The GeneXpert MRSA Assay (FDA approved for NASAL specimens only), is one component of a comprehensive MRSA colonization surveillance program. It is not intended to diagnose MRSA infection nor to guide or monitor treatment for MRSA infections. Performed at [facility], [address], [place], [place] [address]   Expectorated Sputum Assessment w Gram Stain, Rflx to Resp Cult     Status: None   Collection Time: 10/23/20  5:11 AM   Specimen: Sputum  Result Value Ref Range Status   Specimen Description SPUTUM  Final   Special Requests NONE  Final   Sputum evaluation   Final    Sputum specimen not acceptable for testing.  Please recollect.   RESULT CALLED TO, READ BACK BY AND VERIFIED WITH: RN B.YAP ON [id number] AT 1424 BY E.PARRISH Performed at [facility], [address], [place], [place] [address]    Report Status 10/23/2020 FINAL  Final  C Difficile Quick Screen w PCR reflex     Status: None   Collection Time: 10/24/20 11:48 AM  Specimen: STOOL  Result Value Ref Range Status   C Diff antigen NEGATIVE NEGATIVE Final   C Diff toxin NEGATIVE NEGATIVE Final   C Diff interpretation No C. difficile detected.  Final    Comment: Performed at [facility], [address] [place], [place] [address]  Surgical pcr screen     Status: None   Collection Time: 10/24/20 10:12 PM   Specimen: Nasal Mucosa; Nasal Swab  Result Value Ref Range Status   MRSA, PCR NEGATIVE NEGATIVE Final   Staphylococcus aureus NEGATIVE NEGATIVE Final    Comment: (NOTE) The Xpert SA Assay (FDA approved for NASAL specimens in patients 58 years of age and older), is one component of a comprehensive surveillance program. It is not intended to diagnose infection nor to guide or monitor treatment. Performed at [facility], [address] [place], [place] [address]      Labs:  CBC: Recent Labs  Lab 10/22/20 1625 10/23/20 0148 10/24/20 0253 10/25/20 0922 10/26/20 0421 10/27/20 0407  WBC 15.1* 15.2* 12.8*  --  9.5 10.6*  NEUTROABS 12.8* 11.7*  --   --   --   --   HGB 13.8 13.3 14.1 12.9*  12.9* 13.6 14.1  HCT 42.2 41.2 43.7 38.0*  38.0* 41.7 42.6  MCV 83.4 85.3 84.7  --  82.6 82.6  PLT 312 282 324  --  352 418*   BMP &GFR Recent Labs  Lab 10/23/20 0148 10/24/20 0253 10/25/20 0433 10/25/20 0922 10/26/20 0421 10/27/20 0407  NA 136 138 135 138  138 134* 136  K 3.9 3.9 4.4 3.8  3.7 3.1* 4.5  CL 102 105 98  --  97* 98  CO2 25 24 27   --  23 29  GLUCOSE 123* 95 105*  --  82 100*  BUN 10 6 13   --  16 19  CREATININE 1.04 0.88 1.08  --  1.00 1.26*  CALCIUM 8.6* 8.1* 8.6*  --  8.9 9.4  MG 1.9 1.7 2.4  --  2.2 2.2  PHOS 3.0  --  4.1  --   --   --    Estimated Creatinine Clearance: 99.1 mL/min (A) (by C-G formula based on SCr of 1.26 mg/dL  (H)). Liver & Pancreas: Recent Labs  Lab 10/23/20 0148 10/25/20 0433  AST 30 16  ALT 55* 40  ALKPHOS 53 52  BILITOT 1.2 0.9  PROT 5.9* 6.1*  ALBUMIN 2.9* 2.9*   No results for input(s): LIPASE, AMYLASE in the last 168 hours. No results for input(s): AMMONIA in the last 168 hours. Diabetic: Recent Labs    10/25/20 0434 10/26/20 0421  HGBA1C 6.1* 6.1*   No results for input(s): GLUCAP in the last 168 hours. Cardiac Enzymes: No results for input(s): CKTOTAL, CKMB, CKMBINDEX, TROPONINI in the last 168 hours. No results for input(s): PROBNP in the last 8760 hours. Coagulation Profile: No results for input(s): INR, PROTIME in the last 168 hours. Thyroid Function Tests: No results for input(s): TSH, T4TOTAL, FREET4, T3FREE, THYROIDAB in the last 72 hours. Lipid Profile: Recent Labs    10/25/20 0434  CHOL 159  HDL 30*  LDLCALC 115*  TRIG 68  CHOLHDL 5.3   Anemia Panel: No results for input(s): VITAMINB12, FOLATE, FERRITIN, TIBC, IRON, RETICCTPCT in the last 72 hours. Urine analysis:    Component Value Date/Time   COLORURINE YELLOW 10/22/2020 0229   APPEARANCEUR CLEAR 10/22/2020 0229   LABSPEC 1.013 10/22/2020 0229   PHURINE 5.0 10/22/2020  0229   GLUCOSEU NEGATIVE 10/22/2020 0229   HGBUR NEGATIVE 10/22/2020 0229   BILIRUBINUR NEGATIVE 10/22/2020 0229   KETONESUR 5 (A) 10/22/2020 0229   PROTEINUR NEGATIVE 10/22/2020 0229   NITRITE NEGATIVE 10/22/2020 0229   LEUKOCYTESUR NEGATIVE 10/22/2020 0229   Sepsis Labs: Invalid input(s): PROCALCITONIN, LACTICIDVEN   Time coordinating discharge: 45 minutes  SIGNED:  Almon Herculesaye T Darriel Sinquefield, MD  [facility] 10/27/2020, 11:03 PM  If 7PM-7AM, please contact night-coverage [email or web address]

## 2020-10-27 NOTE — TOC Transition Note (Signed)
Transition of Care [facility]) - CM/SW Discharge Note Heart Failure   Patient Details  Name: David Vang MRN: [id number] Date of Birth: [birth year]  Transition of Care [facility]) CM/SW Contact:  Rozelle Caudle, LCSWA Phone Number: 10/27/2020, 11:25 AM   Clinical Narrative:    CSW spoke with the patient at bedside to bring them an appointment card for the [facility] outpatient clinic and encouraged them to follow up and to attend the appointment and bring their medications and if anything changes to please reach out so that CSW/HV clinic team can provide support. CSW provided patient with a scale and pill box.  CSW will sign off for now as social work intervention is no longer needed. Please consult [place] again if new needs arise.   Final next level of care: Home/Self Care Barriers to Discharge: No Barriers Identified   Patient Goals and CMS Choice        Discharge Placement                       Discharge Plan and Services In-house Referral: Clinical Social Work                                   Social Determinants of Health (SDOH) Interventions Food Insecurity Interventions: Intervention Not Indicated Financial Strain Interventions: Other (Comment) (referral to community clinic for PCP and provided patient with a CAFA application.) Housing Interventions: Intervention Not Indicated Transportation Interventions: Intervention Not Indicated   Readmission Risk Interventions No flowsheet data found.   Jahki Witham, MSW, LCSWA [phone] Heart Failure Social Worker

## 2020-10-27 NOTE — Progress Notes (Signed)
Electrophysiology Rounding Note  Patient Name: David Vang Date of Encounter: 10/27/2020  Primary Cardiologist: Dr. Shirlee Latch  Electrophysiologist: Dr. Lalla Brothers  Subjective   The patient is doing well today.  At this time, the patient denies chest pain, shortness of breath, or any new concerns.  He continues to expand on his family history. We re-iterate importance of genetic counseling.   Inpatient Medications    Scheduled Meds: . amiodarone  200 mg Oral BID  . carvedilol  3.125 mg Oral BID WC  . dapagliflozin propanediol  10 mg Oral Daily  . dextromethorphan-guaiFENesin  1 tablet Oral BID  . digoxin  0.125 mg Oral Daily  . enoxaparin (LOVENOX) injection  55 mg Subcutaneous Q24H  . furosemide  40 mg Oral Daily  . losartan  12.5 mg Oral QHS  . sertraline  25 mg Oral Daily  . sodium chloride flush  3 mL Intravenous Q12H  . sodium chloride flush  3 mL Intravenous Q12H  . spironolactone  25 mg Oral Daily   Continuous Infusions: . sodium chloride     PRN Meds: sodium chloride, acetaminophen **OR** acetaminophen, benzonatate, ondansetron (ZOFRAN) IV, sodium chloride flush   Vital Signs    Vitals:   10/27/20 0130 10/27/20 0354 10/27/20 0714 10/27/20 0855  BP:  100/74 98/68 110/78  Pulse:  83 87 (!) 107  Resp:  18 18   Temp:  97.8 F (36.6 C) 98 F (36.7 C)   TempSrc:  Oral Oral   SpO2:  94% 95%   Weight: 103.4 kg     Height:        Intake/Output Summary (Last 24 hours) at 10/27/2020 0940 Last data filed at 10/26/2020 1857 Gross per 24 hour  Intake 717 ml  Output 1000 ml  Net -283 ml   Filed Weights   10/22/20 1539 10/26/20 0351 10/27/20 0130  Weight: 122.5 kg 104.6 kg 103.4 kg    Physical Exam    GEN- The patient is well appearing, alert and oriented x 3 today.   Head- normocephalic, atraumatic Eyes-  Sclera clear, conjunctiva pink Ears- hearing intact Oropharynx- clear Neck- supple Lungs- Clear to ausculation bilaterally, normal work of  breathing Heart- Regular rate and rhythm with occasional ectopy, no murmurs, rubs or gallops GI- soft, NT, ND, + BS Extremities- no clubbing or cyanosis. No edema Skin- no rash or lesion Psych- euthymic mood, full affect Neuro- strength and sensation are intact  Labs    CBC Recent Labs    10/26/20 0421 10/27/20 0407  WBC 9.5 10.6*  HGB 13.6 14.1  HCT 41.7 42.6  MCV 82.6 82.6  PLT 352 418*   Basic Metabolic Panel Recent Labs    67/12/45 0433 10/25/20 0922 10/26/20 0421 10/27/20 0407  NA 135   < > 134* 136  K 4.4   < > 3.1* 4.5  CL 98  --  97* 98  CO2 27  --  23 29  GLUCOSE 105*  --  82 100*  BUN 13  --  16 19  CREATININE 1.08  --  1.00 1.26*  CALCIUM 8.6*  --  8.9 9.4  MG 2.4  --  2.2 2.2  PHOS 4.1  --   --   --    < > = values in this interval not displayed.   Liver Function Tests Recent Labs    10/25/20 0433  AST 16  ALT 40  ALKPHOS 52  BILITOT 0.9  PROT 6.1*  ALBUMIN 2.9*   No  results for input(s): LIPASE, AMYLASE in the last 72 hours. Cardiac Enzymes No results for input(s): CKTOTAL, CKMB, CKMBINDEX, TROPONINI in the last 72 hours.   Telemetry    NSR 80-90s with occasional PVCs (personally reviewed)  Radiology    MR CARDIAC MORPHOLOGY W WO CONTRAST  Result Date: 10/26/2020 CLINICAL DATA:  Cardiomyopathy of uncertain etiology EXAM: CARDIAC MRI TECHNIQUE: The patient was scanned on a 1.5 Tesla GE magnet. A dedicated cardiac coil was used. Functional imaging was done using Fiesta sequences. 2,3, and 4 chamber views were done to assess for RWMA's. Modified Simpson's rule using a short axis stack was used to calculate an ejection fraction on a dedicated work Research officer, trade [employer]. The patient received 8 cc of Gadavist. After 10 minutes inversion recovery sequences were used to assess for infiltration and scar tissue. FINDINGS: Extensive airspace disease right lung and left lung base. Small pericardial effusion. Severe left ventricular dilation with  normal wall thickness. No evidence for LV noncompaction. Diffuse LV hypokinesis, worse in the septum. LV EF 21%. Mildly dilated right ventricle with mildlly decreased systolic function, EF 35%. Moderate left atrial enlargement. Mild right atrial enlargement. Mitral regurgitation appears moderate and central (functional). Trileaflet aortic valve with no stenosis or regurgitation. On delayed enhancement imaging, there is a small area of late gadolinium enhancement (LGE) in the mid-wall at the inferior RV insertion site. Measurements: LVEDV 349 mL LVSV 73 mL LVEF 21% RVEDV 210 mL RVSV 73 mL RVEF 35% IMPRESSION: 1.  Extensive airspace disease R>L, concern for PNA. 2. Severe LV dilation with diffuse hypokinesis worse in the septum, EF 21%. 3.  Mildly dilated RV with EF 35%. 4. RV insertion site LGE, this is nonspecific and suggesive of RV pressure/volume overload. Dalton Mclean Electronically Signed   By: Marca Ancona M.D.   On: 10/26/2020 19:04    Patient Profile     Clearnce Phillipsis a 30 y.o.malewith a history of morbid obesity and family h/o cardiomyopathy And a new diagnosis of systolic CHF who is being seen today for the evaluation offrequent PVCsat the request of Dr. Shirlee Latch.  Assessment & Plan    1.Frequent PVCs Appear outflow track in originby EKG review. ?RVOT more likely vsLVOT with initial R in aVR. Transition in V3 Unclear if primary to or secondary to his cardiomyopathy, though with OT origin ? Primary per discussions.  Dr. Lalla Brothers has seen With lack of insurance and likely months away, will suppress with amiodarone for the time begin per discussion with HF team while awaiting insurance/ablation planning.  6 week follow up in office made.   2. Acute systolic CHF New diagnosis this admission, confounded by CAP.  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  Ongoing GDMT titration per CHF team Suspected OSA noted with plans for outpatient sleep  study Plans for outpatient genetic testing noted No ETOH use. UDS negative TSH WNL. HIV negative. Plan for lifevest per CHF team.  For questions or updates, please contact [facility] Please consult [email or web address] for contact info under Cardiology/STEMI.  Signed, Graciella Freer, PA-C  10/27/2020, 9:40 AM

## 2020-10-27 NOTE — Progress Notes (Signed)
D/C instructions given and reviewed. Tele and IV's removed, tolerated well. Awaiting TOC meds.

## 2020-10-27 NOTE — TOC Transition Note (Signed)
Transition of Care [facility]) - CM/SW Discharge Note   Patient Details  Name: David Vang MRN: [id number] Date of Birth: 1991-05-14  [facility]) CM/SW Contact:  Epifanio Lesches, RN Phone Number: 10/27/2020, 10:30 AM   Clinical Narrative:    Patient will DC to: home Anticipated DC date: 10/27/2020 Family notified: yes, wife Transport by: [facility]  Admitted with newly diagnosed acute systolic heart failure. From home with wife. States independent with ADL'S pta, no DME usage. Pt without PCP, no insurance. Limited income. Per MD patient ready for DC today . RN, patient, and patient's wife aware of DC.Referral made for Life vest during hospitalization and approved. Pt with Life vest on.   Match Letter will be given to pt to assist with Rx med cost. Pt states can afford copay cost.   Pt with f/u appointment @ [facility]. Pt can f/u with [facility] pharmacy with Rx med refills.  Post hospitaL f/u appointments noted on AVS.  Pt states wife to provide transportation to home.  RNCM will sign off for now as intervention is no longer needed. Please consult [place] again if new needs arise.   Final next level of care: Home/Self Care Barriers to Discharge: No Barriers Identified   Patient Goals and CMS Choice        Discharge Placement                       Discharge Plan and Services In-house Referral: Clinical Social Work                                   Social Determinants of Health (SDOH) Interventions Food Insecurity Interventions: Intervention Not Indicated Financial Strain Interventions: Other (Comment) (referral to community clinic for PCP and provided patient with a CAFA application.) Housing Interventions: Intervention Not Indicated Transportation Interventions: Intervention Not Indicated   Readmission Risk Interventions No flowsheet data found.

## 2020-10-27 NOTE — Plan of Care (Signed)
  Problem: Health Behavior/Discharge Planning: Goal: Ability to manage health-related needs will improve Outcome: Adequate for Discharge   Problem: Clinical Measurements: Goal: Ability to maintain clinical measurements within normal limits will improve Outcome: Adequate for Discharge Goal: Will remain free from infection Outcome: Adequate for Discharge Goal: Respiratory complications will improve Outcome: Adequate for Discharge Goal: Cardiovascular complication will be avoided Outcome: Adequate for Discharge   Problem: Activity: Goal: Risk for activity intolerance will decrease Outcome: Adequate for Discharge   Problem: Nutrition: Goal: Adequate nutrition will be maintained Outcome: Adequate for Discharge   

## 2020-10-27 NOTE — Plan of Care (Signed)
  Problem: Clinical Measurements: Goal: Will remain free from infection 10/27/2020 0813 by Gwynne Edinger, RN Outcome: Progressing 10/26/2020 2124 by Gwynne Edinger, RN Outcome: Progressing   Problem: Clinical Measurements: Goal: Respiratory complications will improve Outcome: Progressing

## 2020-10-27 NOTE — Plan of Care (Signed)
  Problem: Health Behavior/Discharge Planning: Goal: Ability to manage health-related needs will improve Outcome: Adequate for Discharge   Problem: Clinical Measurements: Goal: Ability to maintain clinical measurements within normal limits will improve Outcome: Adequate for Discharge Goal: Will remain free from infection Outcome: Adequate for Discharge Goal: Respiratory complications will improve Outcome: Adequate for Discharge Goal: Cardiovascular complication will be avoided Outcome: Adequate for Discharge   Problem: Activity: Goal: Risk for activity intolerance will decrease Outcome: Adequate for Discharge   Problem: Nutrition: Goal: Adequate nutrition will be maintained Outcome: Adequate for Discharge

## 2020-10-31 ENCOUNTER — Encounter (HOSPITAL_COMMUNITY): Payer: Self-pay | Admitting: *Deleted

## 2020-10-31 ENCOUNTER — Telehealth (HOSPITAL_COMMUNITY): Payer: Self-pay | Admitting: *Deleted

## 2020-10-31 NOTE — Telephone Encounter (Signed)
Pt left VM requesting work note I called pt back to get more information. Pt did not answer/left vm for pt to return my call.

## 2020-11-01 ENCOUNTER — Telehealth (HOSPITAL_COMMUNITY): Payer: Self-pay | Admitting: Licensed Clinical Social Worker

## 2020-11-01 LAB — ANTINUCLEAR ANTIBODIES, IFA: ANA Ab, IFA: NEGATIVE

## 2020-11-01 NOTE — Progress Notes (Signed)
Heart and Vascular Care Navigation  11/01/2020  David Vang Apr 19, 1991 [id number]  Reason for Referral: Lack of insurance and financial concerns   Engaged with patient by telephone for initial visit for Heart and Vascular Care Coordination.                                                                                                   Assessment:  CSW received consult from inpatient provider that pt would need follow up in clinic regarding lack of insurance and would potentially need assistance obtaining medications.  CSW spoke with pt over the phone regarding those concerns.  Pt states that he lives in apartment with his wife.  Was working full time up till admission at [employer] and is wife is continuing to work full time to support them.  At this time pt is not feeling as if he could return to work though he would be willing to if his health improves.  Pt without any current financial concerns but understands that getting better could be a process and that concerns might arise in the future if he does not improve enough to go back to work.   Pt is eligible for insurance through his work but it would be about $100 out of his check each month which he is unable to afford so he did not sign up.  Also states he does not get STD benefits through his work. Pt states he did speak with [employer] and that they had him sign consent forms for them to help him apply for Medicaid- pt is listed in system as Medicaid pending so application should be followed by Metall[employer] but unable to verify this in pt FC notes.  Pt is wanting to apply for disability since he is unsure if he can return to work in the near future.  Inpatient TOC CSW already completed [facility] consent form with pt and submitted for assistance with applying for disability.    Food stamp referral also already submitted by TOC CSW but CSW sent pt link to apply online through [employer] as [employer] is understaffed and having  to prioritize disability applications over food stamps.      CSW explained that pt should be able to get most his medications through the HF fund- note placed on upcoming appt to enroll patient after visit with [place].  CSW also encouraged pt to talk with [facility] about blue card once he has established care with them to see about getting assistance with his non heart failure medications.                               HRT/VAS Care Coordination    Patients Home Cardiology Office Heart Failure Clinic   Living arrangements for the past 2 months Apartment   Lives with: Spouse   Patient Current Insurance Coverage Medicaid Pending   Patient Has Concern With Paying Medical Bills Yes   Patient Concerns With Medical Bills no insurance   Medical Bill Referrals: [employer] involved for Medicaid app  Does Patient Have Prescription Coverage? No   Home Assistive Devices/Equipment None      Social History:                                                                             SDOH Screenings   Alcohol Screen: Not on file  Depression (JSE8-3): Not on file  Financial Resource Strain: Medium Risk  . Difficulty of Paying Living Expenses: Somewhat hard  Food Insecurity: No Food Insecurity  . Worried About Programme researcher, broadcasting/film/video in the Last Year: Never true  . Ran Out of Food in the Last Year: Never true  Housing: Low Risk   . Last Housing Risk Score: 0  Physical Activity: Not on file  Social Connections: Not on file  Stress: Not on file  Tobacco Use: Low Risk   . Smoking Tobacco Use: Never Smoker  . Smokeless Tobacco Use: Never Used  Transportation Needs: No Transportation Needs  . Lack of Transportation (Medical): No  . Lack of Transportation (Non-Medical): No    SDOH Interventions: Financial Resources:   pt wife's income is only source at this time- sufficient to cover bills currently  Food Insecurity:    none at this time but concerns for the future as he continues not to work so interested in  applying for food stamps  Housing Insecurity:    none at this time  Transportation:    no concerns   Follow-up plan:    Pt to apply for food stamps online  Pt will plan to apply for blue card once established at [facility] (appt not till July)  Pt will continue to work with [employer] on getting Medicaid benefits.  CSW will continue to follow and assist as needed  Burna Sis, LCSW Clinical Social Worker Advanced Heart Failure Clinic Desk#: [phone] Cell#: [phone]

## 2020-11-03 ENCOUNTER — Telehealth (HOSPITAL_COMMUNITY): Payer: Self-pay | Admitting: Pharmacy Technician

## 2020-11-03 NOTE — Telephone Encounter (Addendum)
Advanced Heart Failure Patient Advocate Encounter  Sent in [employer] application via fax.  Will follow up.  Advanced Heart Failure Patient Advocate Encounter  Patient was approved to receive Farxiga from [employer]  Patient ID: [id number] Effective dates: 11/08/20 through 11/07/21  Called and left the patient message.   Archer Asa, CPhT

## 2020-11-14 NOTE — Progress Notes (Signed)
Advanced Heart Failure Clinic Note    PCP: Pcp, No HF Cardiologist: Dr. Aundra Dubin  Reason for visit: Post hospitalization follow up for systolic HF  HPI:  30 y/o male w/ morbid obesity and family h/o cardiomyopathy, and new systolic HF    Pt was diagnosed w/ CAP ~2 weeks ago and prescribed a course of abx + prednisone w/o improved symptoms. Subsequently, went to [place] on 5/29 w/ worsening symptoms + subjective fever.   In the ED, he met early sepsis criteria and CXR and CT concerning for bilateral PNA + CHF. No PE on CT. He was admitted and transferred to [facility] for further care. Placed on Vanc + cefepime. Also started on IV Lasix for CHF.   Echo showed severely reduced LVEF, 20-25% w/ global HK. GIIDM, no LVH, mild-mod MR. AoV normal. RV normal. Advanced Heart Failure consulted.   He underwent R/LHC on 10/25/20 with elevated right and left heart filling pressures, moderate pulmonary venous hypertension, low cardiac output but no CAD. cMRI with extensive airspace disease, right>left likely from recent pneumonia.  Patient was started on diuretics and GDMT.  He was also started on amiodarone and fitted with LifeVest for his PVCs. He was discharged on GDMT, weight 227 lbs.   Today he returns for HF follow up with his wife. Overall feeling much better. Walking some for exercise, SOB with carrying groceries. Denies increasing SOB, CP, dizziness, edema, or PND/Orthopnea. Appetite ok. No fever or chills. Weight at home 228 pounds. Taking all medications. BP at home ~107-120/65-75.  ROS: All systems reviewed and negative except as per HPI.    Past Medical History:  Diagnosis Date   Morbid obesity ([place])    Current Outpatient Medications  Medication Sig Dispense Refill   amiodarone (PACERONE) 200 MG tablet Take 1 tablet (200 mg total) by mouth 2 (two) times daily. 180 tablet 1   busPIRone (BUSPAR) 5 MG tablet Take 1 tablet (5 mg total) by mouth 3 (three) times daily. 90 tablet 1    carvedilol (COREG) 3.125 MG tablet Take 1 tablet (3.125 mg total) by mouth 2 (two) times daily with a meal. 180 tablet 1   dapagliflozin propanediol (FARXIGA) 10 MG TABS tablet Take 1 tablet (10 mg total) by mouth daily. 90 tablet 1   digoxin (LANOXIN) 0.125 MG tablet Take 1 tablet (0.125 mg total) by mouth daily. 90 tablet 1   furosemide (LASIX) 40 MG tablet Take 1 tablet (40 mg total) by mouth daily. 90 tablet 1   losartan (COZAAR) 25 MG tablet Take 1/2 tablet (12.5 mg total) by mouth at bedtime. 90 tablet 1   sertraline (ZOLOFT) 50 MG tablet Take 1/2 tablet (25 mg total) by mouth daily. 30 tablet 1   spironolactone (ALDACTONE) 25 MG tablet Take 1 tablet (25 mg total) by mouth daily. 90 tablet 1   No current facility-administered medications for this encounter.   Allergies  Allergen Reactions   Benadryl [Diphenhydramine] Other (See Comments)    Pass out   Social History   Socioeconomic History   Marital status: Married    Spouse name: Not on file   Number of children: Not on file   Years of education: Not on file   Highest education level: Not on file  Occupational History   Not on file  Tobacco Use   Smoking status: Never   Smokeless tobacco: Never  Substance and Sexual Activity   Alcohol use: Not Currently   Drug use: Never   Sexual activity:  Not on file  Other Topics Concern   Not on file  Social History Narrative   Not on file   Social Determinants of Health   Financial Resource Strain: Medium Risk   Difficulty of Paying Living Expenses: Somewhat hard  Food Insecurity: No Food Insecurity   Worried About Running Out of Food in the Last Year: Never true   Ran Out of Food in the Last Year: Never true  Transportation Needs: No Transportation Needs   Lack of Transportation (Medical): No   Lack of Transportation (Non-Medical): No  Physical Activity: Not on file  Stress: Not on file  Social Connections: Not on file  Intimate Partner Violence: Not on file   Family  History  Problem Relation Age of Onset   Heart failure Father        dx'ed with cardiomyoopathy in his late 69's/40's, and ultimately required LVAD   Vitals:   11/15/20 0930  BP: 122/78  Pulse: 73  SpO2: 97%  Weight: 105.7 kg (233 lb)   Wt Readings from Last 3 Encounters:  11/15/20 105.7 kg (233 lb)  10/27/20 103.4 kg (227 lb 14.4 oz)   PHYSICAL EXAM: General:  NAD. No resp difficulty HEENT: Normal Neck: Supple. JVP 7-8 cm. Carotids 2+ bilat; no bruits. No lymphadenopathy or thryomegaly appreciated. Cor: PMI nondisplaced. Faint heart tones, Regular rate & rhythm. No rubs, gallops or murmurs. Lungs: Clear Abdomen: Obese, soft, nontender, nondistended. No hepatosplenomegaly. No bruits or masses. Good bowel sounds. LifeVest on. Extremities: No cyanosis, clubbing, rash, edema Neuro: Alert & oriented x 3, cranial nerves grossly intact. Moves all 4 extremities w/o difficulty. Affect pleasant.  ECG: SR w/ LBBB, qrs 108 ms (personally reviewed). Life Vest: no treatments, ~23 hrs/day, ~7,000 steps/day,  Reds: 46%  ASSESSMENT & PLAN: 1. 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 Echo showed EF 20-25%, no LVH, mild-moderate MR, normal RV.  No ETOH or drugs.  RHC/LHC with no coronary disease, moderately elevated filling pressures and low cardiac output (2.2 thermo, 1.9 Fick).  Patient also noted to have very frequent PVCs.  Possible causes of CMP include PVC-mediated, viral myocarditis (URI-type symptoms recently), or familial CMP.  cMRI -  LVEF 21%. - NYHA II. Volume up today, weight up, Reds 47%. - Increase lasix to 40 mg bid x 5 days, then back to 40  mg daily + 20 mEq of KCl daily. - Stop losartan and start Entresto 24/26 mg bid. BMET today, repeat 7-10 days. - Continue carvedilol 3.125 mg bid. - Continue spironolactone 25 mg daily. - Continue Farxiga 10 mg daily.  No GU symptoms. - Continue digoxin 0.125 daily. Digoxin level today. - Will need genetic testing for cardiomyopathy given family history. - Supect OSA, needs sleep study, but has no insurance. HFSW working on this. - Continue LifeVest. 2. PVCs: Frequent, often bigeminal.  Mexiletine tried without much effect. 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  Dr. Aundra Dubin discussed with Dr. Caryl Comes. - Continue amiodarone 200 mg bid.  Not ideal long-term medication for him, but if LV function can improve some over time may be able to stop. - Candidate for possible ablation, but will need insurance. SW working on this. - Continue Coreg 3.125 mg bid. - Discussed with EP. He has follow up in 6 weeks with Dr Quentin Ore.  3. Anxiety: Improving. Now feels somewhat numb.  - Stop buspirone. - Continue sertraline 25 mg daily for now.  - HFSW following. Started process for insurance, Medicaid pending.  Follow up in 3-4 weeks with PharmD for further medication titration.  Allena Katz, FNP-BC 11/15/20

## 2020-11-15 ENCOUNTER — Telehealth (HOSPITAL_COMMUNITY): Payer: Self-pay | Admitting: Pharmacy Technician

## 2020-11-15 ENCOUNTER — Ambulatory Visit (HOSPITAL_COMMUNITY)
Admit: 2020-11-15 | Discharge: 2020-11-15 | Disposition: A | Discharge status: Home / Self Care | Payer: Self-pay | Source: Ambulatory Visit | Attending: Family Medicine | Admitting: Family Medicine

## 2020-11-15 ENCOUNTER — Encounter (HOSPITAL_COMMUNITY): Payer: Self-pay

## 2020-11-15 ENCOUNTER — Other Ambulatory Visit (HOSPITAL_COMMUNITY): Payer: Self-pay | Admitting: *Deleted

## 2020-11-15 ENCOUNTER — Other Ambulatory Visit: Payer: Self-pay

## 2020-11-15 ENCOUNTER — Other Ambulatory Visit (HOSPITAL_COMMUNITY): Payer: Self-pay

## 2020-11-15 VITALS — BP 122/78 | HR 73 | Ht 67.0 in | Wt 233.0 lb

## 2020-11-15 DIAGNOSIS — I5022 Chronic systolic (congestive) heart failure: Secondary | ICD-10-CM

## 2020-11-15 DIAGNOSIS — Z79899 Other long term (current) drug therapy: Secondary | ICD-10-CM | POA: Insufficient documentation

## 2020-11-15 DIAGNOSIS — I110 Hypertensive heart disease with heart failure: Secondary | ICD-10-CM | POA: Insufficient documentation

## 2020-11-15 DIAGNOSIS — Z888 Allergy status to other drugs, medicaments and biological substances status: Secondary | ICD-10-CM | POA: Insufficient documentation

## 2020-11-15 DIAGNOSIS — R008 Other abnormalities of heart beat: Secondary | ICD-10-CM | POA: Insufficient documentation

## 2020-11-15 DIAGNOSIS — R0602 Shortness of breath: Secondary | ICD-10-CM | POA: Insufficient documentation

## 2020-11-15 DIAGNOSIS — Z7984 Long term (current) use of oral hypoglycemic drugs: Secondary | ICD-10-CM | POA: Insufficient documentation

## 2020-11-15 DIAGNOSIS — F419 Anxiety disorder, unspecified: Secondary | ICD-10-CM

## 2020-11-15 DIAGNOSIS — I493 Ventricular premature depolarization: Secondary | ICD-10-CM

## 2020-11-15 DIAGNOSIS — Z8249 Family history of ischemic heart disease and other diseases of the circulatory system: Secondary | ICD-10-CM | POA: Insufficient documentation

## 2020-11-15 DIAGNOSIS — I447 Left bundle-branch block, unspecified: Secondary | ICD-10-CM | POA: Insufficient documentation

## 2020-11-15 LAB — BASIC METABOLIC PANEL
Anion gap: 8 (ref 5–15)
BUN: 9 mg/dL (ref 6–20)
CO2: 32 mmol/L (ref 22–32)
Calcium: 9.2 mg/dL (ref 8.9–10.3)
Chloride: 97 mmol/L — ABNORMAL LOW (ref 98–111)
Creatinine, Ser: 1.24 mg/dL (ref 0.61–1.24)
GFR, Estimated: >60 mL/min (ref >60)
Glucose, Bld: 103 mg/dL — ABNORMAL HIGH (ref 70–99)
Potassium: 3.6 mmol/L (ref 3.5–5.1)
Sodium: 137 mmol/L (ref 135–145)

## 2020-11-15 LAB — DIGOXIN LEVEL: Digoxin Level: 1 ng/mL (ref 0.8–2.0)

## 2020-11-15 MED ORDER — POTASSIUM CHLORIDE CRYS ER 20 MEQ PO TBCR
20.0000 meq | EXTENDED_RELEASE_TABLET | Freq: Every day | ORAL | 2 refills | Status: DC
  Filled 2020-11-15 (×2): qty 30, 30d supply, fill #0

## 2020-11-15 MED ORDER — POTASSIUM CHLORIDE CRYS ER 20 MEQ PO TBCR
20.0000 meq | EXTENDED_RELEASE_TABLET | Freq: Every day | ORAL | 3 refills | Status: DC
  Filled 2020-11-15: qty 90, 90d supply, fill #0

## 2020-11-15 MED ORDER — DAPAGLIFLOZIN PROPANEDIOL 10 MG PO TABS
10.0000 mg | ORAL_TABLET | Freq: Every day | ORAL | 3 refills | Status: DC
  Filled 2020-11-15: qty 30, 30d supply, fill #0

## 2020-11-15 MED ORDER — FUROSEMIDE 40 MG PO TABS
ORAL_TABLET | ORAL | 1 refills | Status: DC
  Filled 2020-11-15: qty 35, 30d supply, fill #0
  Filled 2020-11-15: qty 90, 35d supply, fill #0

## 2020-11-15 MED ORDER — SERTRALINE HCL 50 MG PO TABS
25.0000 mg | ORAL_TABLET | Freq: Every day | ORAL | 1 refills | Status: DC
  Filled 2020-11-15 – 2020-11-23 (×2): qty 30, 60d supply, fill #0

## 2020-11-15 MED ORDER — CARVEDILOL 3.125 MG PO TABS
3.1250 mg | ORAL_TABLET | Freq: Two times a day (BID) | ORAL | 2 refills | Status: DC
  Filled 2020-11-15 – 2020-11-23 (×2): qty 60, 30d supply, fill #0
  Filled 2020-12-27: qty 60, 30d supply, fill #1
  Filled 2021-01-25: qty 60, 30d supply, fill #2

## 2020-11-15 MED ORDER — SPIRONOLACTONE 25 MG PO TABS
25.0000 mg | ORAL_TABLET | Freq: Every day | ORAL | 2 refills | Status: DC
  Filled 2020-11-15 – 2020-11-23 (×2): qty 30, 30d supply, fill #0
  Filled 2020-12-27: qty 30, 30d supply, fill #1
  Filled 2021-01-25: qty 30, 30d supply, fill #2

## 2020-11-15 MED ORDER — AMIODARONE HCL 200 MG PO TABS
200.0000 mg | ORAL_TABLET | Freq: Two times a day (BID) | ORAL | 2 refills | Status: DC
  Filled 2020-11-15 – 2020-11-23 (×2): qty 60, 30d supply, fill #0
  Filled 2020-12-27: qty 60, 30d supply, fill #1
  Filled 2021-01-25: qty 60, 30d supply, fill #2

## 2020-11-15 MED ORDER — DIGOXIN 125 MCG PO TABS
0.1250 mg | ORAL_TABLET | Freq: Every day | ORAL | 2 refills | Status: DC
  Filled 2020-11-15: qty 30, 30d supply, fill #0

## 2020-11-15 MED ORDER — ENTRESTO 24-26 MG PO TABS
1.0000 | ORAL_TABLET | Freq: Two times a day (BID) | ORAL | 11 refills | Status: DC
  Filled 2020-11-15: qty 60, 30d supply, fill #0
  Filled 2020-12-29 (×2): qty 60, 30d supply, fill #1

## 2020-11-15 NOTE — Patient Instructions (Signed)
INCREASE Lasix to 40 mg twice a day for 5 days, then resume normal dose of 40 mg daily thereafter START Potassium 20 meq, one tba daily START Entresto 24/26 mg, one tab twice a day STOP Losartan STOP Buspar  Labs today We will only contact you if something comes back abnormal or we need to make some changes. Otherwise no news is good news!  Your physician recommends that you schedule a follow-up appointment in: 3-4 weeks  in the Advanced Practitioners (PA/NP) Clinic and in 3-4 months with Dr McLean/echo  Your physician has requested that you have an echocardiogram. Echocardiography is a painless test that uses sound waves to create images of your heart. It provides your doctor with information about the size and shape of your heart and how well your heart's chambers and valves are working. This procedure takes approximately one hour. There are no restrictions for this procedure.  Do the following things EVERYDAY: Weigh yourself in the morning before breakfast. Write it down and keep it in a log. Take your medicines as prescribed Eat low salt foods--Limit salt (sodium) to 2000 mg per day.  Stay as active as you can everyday Limit all fluids for the day to less than 2 liters  At the Advanced Heart Failure Clinic, you and your health needs are our priority. As part of our continuing mission to provide you with exceptional heart care, we have created designated Provider Care Teams. These Care Teams include your primary Cardiologist (physician) and Advanced Practice Providers (APPs- Physician Assistants and Nurse Practitioners) who all work together to provide you with the care you need, when you need it.   You may see any of the following providers on your designated Care Team at your next follow up: Dr Arvilla Meres Dr Marca Ancona Dr Brandon Melnick, NP Robbie Lis, [place] Mikki Santee Karle Plumber, PharmD   Please be sure to bring in all your medications bottles  to every appointment.   If you have any questions or concerns before your next appointment please send [place] a message through [place] or call our office at [phone].    TO LEAVE A MESSAGE FOR THE NURSE SELECT OPTION 2, PLEASE LEAVE A MESSAGE INCLUDING: YOUR NAME DATE OF BIRTH CALL BACK NUMBER REASON FOR CALL**this is important as we prioritize the call backs  YOU WILL RECEIVE A CALL BACK THE SAME DAY AS LONG AS YOU CALL BEFORE 4:00 PM

## 2020-11-15 NOTE — Telephone Encounter (Signed)
Advanced Heart Failure Patient Advocate Encounter  Patient was seen in clinic today and started on Entresto.   The patient is currently uninsured. Started an application for [employer].   Will fax in once signatures are obtained.  

## 2020-11-15 NOTE — Progress Notes (Signed)
ReDS Vest / Clip - 11/15/20 1000       ReDS Vest / Clip   Station Marker D    Ruler Value 32    ReDS Value Range High volume overload    ReDS Actual Value 46

## 2020-11-15 NOTE — Progress Notes (Signed)
Heart and Vascular Care Navigation  11/15/2020  David Vang Feb 28, 1991 [id number]  Reason for Referral: Patient referred to assist insurance and disability.   Engaged with patient face to face for initial visit for Heart and Vascular Care Coordination.                                                                                                   Assessment:  Patient is a 30yo male who is married and resides in an apartment with his spouse. He was working at [employer] until he was hospitalized.  Patient reports he has no STD and is currently uninsured.  Patient does not feel he is able to return to work at the moment but should he feel better will plan on it. Patient has a pending medicaid application as well as a pending disability application through the [facility]. His wife is working although they share concerns about the ability to cover their expenses on one income.                              HRT/VAS Care Coordination     Patients Home Cardiology Office Heart Failure Clinic   Outpatient Care Team Social Worker   Social Worker Name: Lasandra Beech, [place] [phone]   Living arrangements for the past 2 months Apartment   Lives with: Spouse   Patient Current Insurance Coverage Self-Pay   Patient Has Concern With Paying Medical Bills Yes   Patient Concerns With Medical Bills no insurance   Medical Bill Referrals: Pending Disability with [facility]   Does Patient Have Prescription Coverage? No   Patient Prescription Assistance Programs Heart Failure [facility] Assistive Devices/Equipment Scales       Social History:                                                                             SDOH Screenings   Alcohol Screen: Not on file  Depression (PHQ2-9): Not on file  Financial Resource Strain: Medium Risk   Difficulty of Paying Living Expenses: Somewhat hard  Food Insecurity: No Food Insecurity   Worried About Running Out of Food in the Last  Year: Never true   Ran Out of Food in the Last Year: Never true  Housing: Low Risk    Last Housing Risk Score: 0  Physical Activity: Not on file  Social Connections: Not on file  Stress: Not on file  Tobacco Use: Low Risk    Smoking Tobacco Use: Never   Smokeless Tobacco Use: Never  Transportation Needs: No Transportation Needs   Lack of Transportation (Medical): No   Lack of Transportation (Non-Medical): No    SDOH Interventions: Financial Resources:    Tree surgeon for Disability application assistance  and will assist with Patient Care Fund if needed for rent and utilities.  Food Insecurity:   Denies need  Housing Insecurity:   Denies need  Transportation:    Denies need    Other Care Navigation Interventions:     Inpatient/Outpatient Substance Abuse Counseling/Rehab Options N/a  Provided Pharmacy assistance resources Heart Failure Fund  Patient expressed Mental Health concerns No.  Patient Referred to: N/a   Follow-up plan:  CSW discussed Social Security process for disability and could be a lengthy process before hearing a determination. Patient states that he is working closely with [facility] and denies any other concerns at this time. CSW provided contact information should patient need assistance with rent in the future. CSW available as needed. Lasandra Beech, LCSW, CCSW-MCS [phone]

## 2020-11-16 ENCOUNTER — Other Ambulatory Visit (HOSPITAL_COMMUNITY): Payer: Self-pay

## 2020-11-16 ENCOUNTER — Other Ambulatory Visit (HOSPITAL_COMMUNITY): Payer: Self-pay | Admitting: Student

## 2020-11-22 ENCOUNTER — Telehealth (HOSPITAL_COMMUNITY): Payer: Self-pay | Admitting: Family Medicine

## 2020-11-22 ENCOUNTER — Ambulatory Visit (HOSPITAL_COMMUNITY)
Admission: RE | Admit: 2020-11-22 | Discharge: 2020-11-22 | Disposition: A | Discharge status: Home / Self Care | Payer: Self-pay | Source: Ambulatory Visit | Attending: Internal Medicine | Admitting: Internal Medicine

## 2020-11-22 ENCOUNTER — Other Ambulatory Visit: Payer: Self-pay

## 2020-11-22 DIAGNOSIS — I5022 Chronic systolic (congestive) heart failure: Secondary | ICD-10-CM

## 2020-11-22 LAB — BASIC METABOLIC PANEL
Anion gap: 10 (ref 5–15)
BUN: 14 mg/dL (ref 6–20)
CO2: 31 mmol/L (ref 22–32)
Calcium: 9.4 mg/dL (ref 8.9–10.3)
Chloride: 94 mmol/L — ABNORMAL LOW (ref 98–111)
Creatinine, Ser: 1.22 mg/dL (ref 0.61–1.24)
GFR, Estimated: >60 mL/min (ref >60)
Glucose, Bld: 115 mg/dL — ABNORMAL HIGH (ref 70–99)
Potassium: 3.6 mmol/L (ref 3.5–5.1)
Sodium: 135 mmol/L (ref 135–145)

## 2020-11-22 LAB — DIGOXIN LEVEL: Digoxin Level: 1.5 ng/mL (ref 0.8–2.0)

## 2020-11-22 NOTE — Telephone Encounter (Signed)
Informed patient of elevated dig level. Instructed to stop digoxin. Repeat lab scheduled for 12/04/20 at 10 am. Patient voiced understanding.

## 2020-11-23 ENCOUNTER — Other Ambulatory Visit (HOSPITAL_COMMUNITY): Payer: Self-pay

## 2020-11-24 ENCOUNTER — Telehealth (HOSPITAL_COMMUNITY): Payer: Self-pay | Admitting: *Deleted

## 2020-11-24 ENCOUNTER — Other Ambulatory Visit (HOSPITAL_COMMUNITY): Payer: Self-pay

## 2020-11-24 MED ORDER — FUROSEMIDE 40 MG PO TABS
40.0000 mg | ORAL_TABLET | Freq: Two times a day (BID) | ORAL | 3 refills | Status: DC
  Filled 2020-11-24 (×2): qty 60, 30d supply, fill #0
  Filled 2020-12-29: qty 60, 30d supply, fill #1
  Filled 2021-02-06: qty 60, 30d supply, fill #2
  Filled 2021-03-09: qty 60, 30d supply, fill #3

## 2020-11-24 NOTE — Telephone Encounter (Signed)
Pt called stating his weight is up 3.2lbs over night. Per Shanda Bumps [place],FNP take lasix 40mg  bid watch salt and fluid intake and keep lab visit 7/11.  Pt aware and agreeable with plan.

## 2020-11-30 NOTE — Telephone Encounter (Signed)
Advanced Heart Failure Patient Advocate Encounter  Submitted Patient Assistance Application to [employer] for  Entresto  along with provider portion. Will update patient when we receive a response.  Fax# [phone] Phone# [phone]  

## 2020-12-04 ENCOUNTER — Other Ambulatory Visit: Payer: Self-pay

## 2020-12-04 ENCOUNTER — Ambulatory Visit (HOSPITAL_COMMUNITY)
Admission: RE | Admit: 2020-12-04 | Discharge: 2020-12-04 | Disposition: A | Discharge status: Home / Self Care | Payer: Self-pay | Source: Ambulatory Visit | Attending: Internal Medicine | Admitting: Internal Medicine

## 2020-12-04 DIAGNOSIS — I5022 Chronic systolic (congestive) heart failure: Secondary | ICD-10-CM

## 2020-12-04 LAB — DIGOXIN LEVEL: Digoxin Level: <0.2 ng/mL — ABNORMAL LOW (ref 0.8–2.0)

## 2020-12-06 ENCOUNTER — Other Ambulatory Visit: Payer: Self-pay

## 2020-12-06 ENCOUNTER — Encounter: Payer: Self-pay | Admitting: Physician Assistant

## 2020-12-06 ENCOUNTER — Other Ambulatory Visit (HOSPITAL_COMMUNITY): Payer: Self-pay

## 2020-12-06 ENCOUNTER — Encounter (INDEPENDENT_AMBULATORY_CARE_PROVIDER_SITE_OTHER): Payer: Self-pay

## 2020-12-06 ENCOUNTER — Ambulatory Visit: Payer: Self-pay | Attending: Physician Assistant | Admitting: Physician Assistant

## 2020-12-06 VITALS — BP 105/74 | HR 77 | Ht 67.0 in | Wt 230.0 lb

## 2020-12-06 DIAGNOSIS — F32A Depression, unspecified: Secondary | ICD-10-CM | POA: Insufficient documentation

## 2020-12-06 DIAGNOSIS — R7303 Prediabetes: Secondary | ICD-10-CM

## 2020-12-06 DIAGNOSIS — D72825 Bandemia: Secondary | ICD-10-CM

## 2020-12-06 DIAGNOSIS — Z09 Encounter for follow-up examination after completed treatment for conditions other than malignant neoplasm: Secondary | ICD-10-CM

## 2020-12-06 DIAGNOSIS — I5021 Acute systolic (congestive) heart failure: Secondary | ICD-10-CM

## 2020-12-06 DIAGNOSIS — R0602 Shortness of breath: Secondary | ICD-10-CM

## 2020-12-06 DIAGNOSIS — J189 Pneumonia, unspecified organism: Secondary | ICD-10-CM

## 2020-12-06 DIAGNOSIS — A419 Sepsis, unspecified organism: Secondary | ICD-10-CM

## 2020-12-06 MED ORDER — SERTRALINE HCL 50 MG PO TABS
50.0000 mg | ORAL_TABLET | Freq: Every day | ORAL | 1 refills | Status: DC
  Filled 2020-12-06: qty 30, 30d supply, fill #0

## 2020-12-06 NOTE — Progress Notes (Signed)
Patient ID: David Vang, male   DOB: 04/10/1991, 30 y.o.   MRN: [id number]    David Vang, is a 30 y.o. male  YSA:[id number]  NAT:[id number]  DOB - Jul 26, 1990  Subjective:  Chief Complaint and HPI: David Vang is a 30 y.o. male here today to establish care and for a follow up visit After hospitalization 5/29-10/27/2020.  Saw cardiology in f/up 11/15/2020.  He is doing well.  Almost feels back to baseline.  SW with cardiology and pharmacy are helping him with meds.  He was denied medicaid and will get [facility] discount packets today.  He has been walking for exercise and is making lifestyle changes.  Depression has improved with 25mg  zoloft and he is tolerating it well.     From hospital discharge: Hospital Course: 30 year old M with PMH of morbid obesity and "recurrent pneumonia" presented to ED with shortness of breath and admitted with concern for "sepsis due to bilateral pneumonia". Reportedly, he was treated outpatient with oral antibiotic and steroids 2 weeks prior to presentation.   Patient had mild temp to 99.6 and leukocytosis to 15.2.  Lactic acid was within normal.  BNP, troponin and D-dimer were marginally elevated.  EKG with ventricular bigeminy.  CTA chest negative for PE but extensive airspace lung opacities, more evident on the right, with borderline cardiomegaly and a trace right pleural effusion concerning for asymmetric pulmonary edema or multifocal pneumonia.  COVID-19 and influenza PCR were negative.  His procalcitonin was negative.  He was started on broad-spectrum antibiotics and admitted for community-acquired pneumonia.   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  Repeat procalcitonin remain negative.  Antibiotics discontinued.    Advanced heart failure team consulted.  R/LHC on 6/1 with elevated right and left heart filling pressures, moderate pulmonary venous hypertension, low cardiac output but no CAD.  cMRI with extensive airspace disease, right>left likely from recent pneumonia.  Patient was started on diuretics and GDMT.  He was also started on amiodarone and fitted with LifeVest for his PVCs.     On the day of discharge, patient felt well and ready to go home.  He ambulated in the hallway multiple times without distress.  He was cleared for discharge home on cardiac medications as below.  Outpatient follow-up arranged by cardiology and electrophysiology.   See individual problem list below for more on hospital course.   Discharge Diagnoses:  Acute combined CHF/cardiomyopathy: Likely cause of his respiratory symptoms refractory to antibiotics.  He could have viral respiratory infections.  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  Significant family history.  His TSH and HIV was within normal.  EKG with ventricular bigeminy. R/LHC and cMRI as above.  Inflammatory markers elevated. -Cleared for discharge by advanced heart failure team on Lasix 40 mg daily Coreg 3.125 mg twice a day Losartan 12.5 mg daily Spironolactone 25 mg daily Farxiga 10 mg daily Digoxin 0.125 mg daily Amio 200 mg twice a day  Also add sertraline 25 mg daily for anxiety Over his prescriptions filled at [facility] pharmacy. Counseled on sodium and fluid restrictions Recommend outpatient sleep study to rule out OSA   PVCs-frequent bigeminy.  TSH within normal. -Discharged on amiodarone and LifeVest   Hyponatremia/hypokalemia-resolved   Community-acquired pneumonia-Ruled out.  Procalcitonin remain negative x3.  MRI finding likely residual from recent pneumonia. -Discontinued antibiotics   Diarrhea-Resolved.  C. difficile negative.   Anxiety: stable -Zoloft per cardiology -  Added BuSpar as well   Leukocytosis/bandemia: Likely demargination from recent steroid versus bacterial infection.   Class II obesity Body mass index is 35.69 kg/m.  -Encourage lifestyle change to lose weight.  From cardiology  note A/P 11/15/2020: ASSESSMENT & PLAN: 1. 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 Echo showed EF 20-25%, no LVH, mild-moderate MR, normal RV.  No ETOH or drugs.  RHC/LHC with no coronary disease, moderately elevated filling pressures and low cardiac output (2.2 thermo, 1.9 Fick).  Patient also noted to have very frequent PVCs.  Possible causes of CMP include PVC-mediated, viral myocarditis (URI-type symptoms recently), or familial CMP.  cMRI -  LVEF 21%. - NYHA II. Volume up today, weight up, Reds 47%. - Increase lasix to 40 mg bid x 5 days, then back to 40  mg daily + 20 mEq of KCl daily. - Stop losartan and start Entresto 24/26 mg bid. BMET today, repeat 7-10 days. - Continue carvedilol 3.125 mg bid. - Continue spironolactone 25 mg daily. - Continue Farxiga 10 mg daily. No GU symptoms. - Continue digoxin 0.125 daily. Digoxin level today. - Will need genetic testing for cardiomyopathy given family history. - Supect OSA, needs sleep study, but has no insurance. HFSW working on this. - Continue LifeVest. 2. PVCs: Frequent, often bigeminal.  Mexiletine tried without much effect. 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  Dr. Shirlee Latch discussed with Dr. Graciela Husbands. - Continue amiodarone 200 mg bid.  Not ideal long-term medication for him, but if LV function can improve some over time may be able to stop. - Candidate for possible ablation, but will need insurance. SW working on this. - Continue Coreg 3.125 mg bid. - Discussed with EP. He has follow up in 6 weeks with Dr Lalla Brothers. 3. Anxiety: Improving. Now feels somewhat numb. - Stop buspirone. - Continue sertraline 25 mg daily for now.   - HFSW following. Started process for insurance, Medicaid pending.   Follow up in 3-4 weeks with PharmD  for further medication titration.    ED/Hospital notes reviewed.   Social History: Family history:  see in note-his dad had early heart problems  ROS:   Constitutional:  No f/c, No night sweats, No unexplained weight loss. EENT:  No vision changes, No blurry vision, No hearing changes. No mouth, throat, or ear problems.  Respiratory: No cough, No SOB Cardiac: No CP, no palpitations GI:  No abd pain, No N/V/D. GU: No Urinary s/sx Musculoskeletal: No joint pain Neuro: No headache, no dizziness, no motor weakness.  Skin: No rash Endocrine:  No polydipsia. No polyuria.  Psych: Denies SI/HI  Problem  Depression    ALLERGIES: Allergies  Allergen Reactions   Benadryl [Diphenhydramine] Other (See Comments)    Pass out    PAST MEDICAL HISTORY: Past Medical History:  Diagnosis Date   Morbid obesity (HCC)     MEDICATIONS AT HOME: Prior to Admission medications   Medication Sig Start Date End Date Taking? Authorizing Provider  amiodarone (PACERONE) 200 MG tablet Take 1 tablet (200 mg total) by mouth 2 (two) times daily. 10/27/20  Yes Almon Hercules, MD  amiodarone (PACERONE) 200 MG tablet Take 1 tablet (200 mg total) by mouth 2 (two) times daily. 11/15/20  Yes Milford, Anderson Malta, FNP  carvedilol (COREG) 3.125 MG tablet Take  1 tablet (3.125 mg total) by mouth 2 (two) times daily with a meal. 11/15/20  Yes Milford, Anderson Malta, FNP  dapagliflozin propanediol (FARXIGA) 10 MG TABS tablet Take 1 tablet (10 mg total) by mouth daily. 11/15/20  Yes Milford, Anderson Malta, FNP  digoxin (LANOXIN) 0.125 MG tablet Take 1 tablet (0.125 mg total) by mouth daily. 11/15/20  Yes Milford, Anderson Malta, FNP  furosemide (LASIX) 40 MG tablet Take 1 tablet (40 mg total) by mouth 2 (two) times daily. 11/24/20  Yes Milford, Anderson Malta, FNP  potassium chloride SA (KLOR-CON) 20 MEQ tablet Take 1 tablet (20 mEq total) by mouth daily. 11/15/20  Yes Milford, Anderson Malta, FNP  sacubitril-valsartan (ENTRESTO) 24-26 MG Take 1 tablet  by mouth 2 (two) times daily. 11/15/20  Yes Milford, Anderson Malta, FNP  spironolactone (ALDACTONE) 25 MG tablet Take 1 tablet (25 mg total) by mouth daily. 11/15/20  Yes Milford, Anderson Malta, FNP  sertraline (ZOLOFT) 50 MG tablet Take 1 tablet (50 mg total) by mouth daily. 12/06/20   Anders Simmonds, PA-C     Objective:  EXAM:   Vitals:   12/06/20 0843  BP: 105/74  Pulse: 77  SpO2: 98%  Weight: 230 lb (104.3 kg)  Height: 5\' 7"  (1.702 m)    General appearance : A&OX3. NAD. Non-toxic-appearing HEENT: Atraumatic and Normocephalic.  PERRLA. EOM intact.  Chest/Lungs:  Breathing-non-labored, Good air entry bilaterally, breath sounds normal without rales, rhonchi, or wheezing  CVS: S1 S2 regular, no murmurs, gallops, rubs  Extremities: Bilateral Lower Ext shows no edema, both legs are warm to touch with = pulse throughout Neurology:  CN II-XII grossly intact, Non focal.   Psych:  TP linear. J/I WNL. Normal speech. Appropriate eye contact and affect.  Skin:  No Rash  Data Review Lab Results  Component Value Date   HGBA1C 6.1 (H) 10/26/2020   HGBA1C 6.1 (H) 10/25/2020     Assessment & Plan   1. Acute systolic CHF (congestive heart failure) (HCC) Continue current medications and life jacket.  Continue daily weights-down 3 pounds from cardiology visit.   - Comprehensive metabolic panel  2. Sepsis, due to unspecified organism, unspecified whether acute organ dysfunction present (HCC) - CBC with Differential/Platelet  3. SOB (shortness of breath) Much improved/resolved  4. Bandemia - CBC with Differential/Platelet  5. Community acquired pneumonia, unspecified laterality resolved - CBC with Differential/Platelet  6. Depression, unspecified depression type Can increase dose - sertraline (ZOLOFT) 50 MG tablet; Take 1 tablet (50 mg total) by mouth daily.  Dispense: 30 tablet; Refill: 1  7. Hospital discharge follow-up Doing well  8.  Prediabetes-he is working on diet and  exercise.  I have had a lengthy discussion and provided education about insulin resistance and the intake of too much sugar/refined carbohydrates.  I have advised the patient to work at a goal of eliminating sugary drinks, candy, desserts, sweets, refined sugars, processed foods, and white carbohydrates.  The patient expresses understanding.    Patient have been counseled extensively about nutrition and exercise  Return in about 2 months (around 02/06/2021) for assign PCP.  The patient was given clear instructions to go to ER or return to medical center if symptoms don't improve, worsen or new problems develop. The patient verbalized understanding. The patient was told to call to get lab results if they haven't heard anything in the next week.     02/08/2021, PA-C [facility] [place], [place]   12/06/2020, 9:02  AM  

## 2020-12-06 NOTE — Progress Notes (Signed)
Electrophysiology Office Follow up Visit Note:    Date:  12/08/2020   ID:  [name], DOB 06-12-1990, MRN [id number]  PCP:  Pcp, No  [facility] Cardiologist:  None  [facility] Electrophysiologist:  Lanier Prude, MD    Interval History:    David Vang is a 30 y.o. male who presents for a follow up visit. They were last seen on 10/27/2020 for his PVC and decreased EF. The patient was initially started on amiodarone in hopes of achieving acute suppression of the PVC. The goal of amiodarone was to bridge [place] to an ablation.  He is uninsured and working on [employer] coverage so that we can proceed with scheduling an ablation procedure.   He is doing well today.  He tells me his exercise tolerance has improved significantly since leaving the hospital.  No PVCs that he can tell.  No syncope or presyncope.  His volume status has improved since starting Lasix.  His weight is stable.   Past Medical History:  Diagnosis Date   Morbid obesity [facility])     Past Surgical History:  Procedure Laterality Date   RIGHT/LEFT HEART CATH AND CORONARY ANGIOGRAPHY N/A 10/25/2020   Procedure: RIGHT/LEFT HEART CATH AND CORONARY ANGIOGRAPHY;  Surgeon: Laurey Morale, MD;  Location: [facility] INVASIVE CV LAB;  Service: Cardiovascular;  Laterality: N/A;    Current Medications: Current Meds  Medication Sig   amiodarone (PACERONE) 200 MG tablet Take 1 tablet (200 mg total) by mouth 2 (two) times daily.   amiodarone (PACERONE) 200 MG tablet Take 1 tablet (200 mg total) by mouth 2 (two) times daily.   carvedilol (COREG) 3.125 MG tablet Take 1 tablet (3.125 mg total) by mouth 2 (two) times daily with a meal.   dapagliflozin propanediol (FARXIGA) 10 MG TABS tablet Take 1 tablet (10 mg total) by mouth daily.   digoxin (LANOXIN) 0.125 MG tablet Take 1 tablet (0.125 mg total) by mouth daily.   furosemide (LASIX) 40 MG tablet Take 1 tablet (40 mg total) by mouth 2 (two) times daily.   potassium  chloride SA (KLOR-CON) 20 MEQ tablet Take 1 tablet (20 mEq total) by mouth daily.   sacubitril-valsartan (ENTRESTO) 24-26 MG Take 1 tablet by mouth 2 (two) times daily.   sertraline (ZOLOFT) 50 MG tablet Take 1 tablet (50 mg total) by mouth daily.   spironolactone (ALDACTONE) 25 MG tablet Take 1 tablet (25 mg total) by mouth daily.     Allergies:   Benadryl [diphenhydramine]   Social History   Socioeconomic History   Marital status: Married    Spouse name: Not on file   Number of children: Not on file   Years of education: Not on file   Highest education level: Not on file  Occupational History   Not on file  Tobacco Use   Smoking status: Never   Smokeless tobacco: Never  Substance and Sexual Activity   Alcohol use: Not Currently   Drug use: Never   Sexual activity: Not on file  Other Topics Concern   Not on file  Social History Narrative   Not on file   Social Determinants of Health   Financial Resource Strain: Medium Risk   Difficulty of Paying Living Expenses: Somewhat hard  Food Insecurity: No Food Insecurity   Worried About Running Out of Food in the Last Year: Never true   Ran Out of Food in the Last Year: Never true  Transportation Needs: No Transportation Needs   Lack of  Transportation (Medical): No   Lack of Transportation (Non-Medical): No  Physical Activity: Not on file  Stress: Not on file  Social Connections: Not on file     Family History: The patient's family history includes Heart failure in his father.  ROS:   Please see the history of present illness.    All other systems reviewed and are negative.  EKGs/Labs/Other Studies Reviewed:    The following studies were reviewed today:   Oct 23, 2020 echo personally reviewed Left ventricular function severely decreased, 20% Right ventricular function normal Mild to moderate MR    October 25, 2020 cardiac MRI IMPRESSION: 1.  Extensive airspace disease R>L, concern for PNA. 2. Severe LV dilation  with diffuse hypokinesis worse in the septum, EF 21%. 3.  Mildly dilated RV with EF 35%. 4. RV insertion site LGE, this is nonspecific and suggesive of RV pressure/volume overload.       10/22/2020 ECG Monomorphic bigeminal PVCs. Steeply inferior axis and precordial transition in V3 preceding sinus transition.    11/15/2020 ECG Sinus rhythm.   EKG:  The ekg ordered today demonstrates sinus rhythm.  QRS duration 114 ms.  Recent Labs: 10/23/2020: TSH 1.775 10/25/2020: B Natriuretic Peptide 315.9 10/27/2020: Magnesium 2.2 12/06/2020: ALT 29; BUN 17; Creatinine, Ser 1.25; Hemoglobin 15.2; Platelets 321; Potassium 4.0; Sodium 139  Recent Lipid Panel    Component Value Date/Time   CHOL 159 10/25/2020 0434   TRIG 68 10/25/2020 0434   HDL 30 (L) 10/25/2020 0434   CHOLHDL 5.3 10/25/2020 0434   VLDL 14 10/25/2020 0434   LDLCALC 115 (H) 10/25/2020 0434    Physical Exam:    VS:  BP 116/60   Pulse 63   Ht 5\' 7"  (1.702 m)   Wt 231 lb 3.2 oz (104.9 kg)   SpO2 98%   BMI 36.21 kg/m     Wt Readings from Last 3 Encounters:  12/08/20 231 lb 3.2 oz (104.9 kg)  12/06/20 230 lb (104.3 kg)  11/15/20 233 lb (105.7 kg)     GEN:  Well nourished, well developed in no acute distress HEENT: Normal NECK: No JVD; No carotid bruits LYMPHATICS: No lymphadenopathy CARDIAC: RRR, no murmurs, rubs, gallops RESPIRATORY:  Clear to auscultation without rales, wheezing or rhonchi  ABDOMEN: Soft, non-tender, non-distended MUSCULOSKELETAL:  No edema; No deformity  SKIN: Warm and dry NEUROLOGIC:  Alert and oriented x 3 PSYCHIATRIC:  Normal affect   ASSESSMENT:    1. PVC's (premature ventricular contractions)   2. Chronic systolic heart failure (HCC)    PLAN:    In order of problems listed above:   1. PVC's (premature ventricular contractions) Resolved on amiodarone.  For now, continue amiodarone 200 mg by mouth twice daily.  He has had recent blood work.  At the follow-up appointment with Dr.  11/17/20 in the heart failure clinic in September, he should have his thyroid rechecked.  I would like to continue amiodarone for at least 6 months.  I would plan to stop the medication at my next follow-up with him in 6 months.  Hopefully, if the ejection fraction has improved, the PVC will not recur off amiodarone.  If the PVC does recur we will need to consider alternative antiarrhythmic drugs or an ablation procedure to help get rid of the PVC.  2. Chronic systolic heart failure (HCC) NYHA class II today.  Warm and dry.  Wearing his LifeVest.  He should have his ejection fraction rechecked at the follow-up with Dr. October in  the heart failure clinic in September.  If the ejection fraction is still low despite being on optimal medical therapy and without PVCs, I would proceed with ICD implant.  If the ejection fraction has improved to greater than 35% with PVC suppression and optimal medical therapy, can discontinue LifeVest therapy at that time and he will not need an ICD implant.  For now, continue Coreg, Farxiga, Lasix, Entresto, spironolactone.  He should stay off of digoxin.   Follow-up with me in 6 months. Echo with Dr. Shirlee Latch in the heart failure clinic in September.   Medication Adjustments/Labs and Tests Ordered: Current medicines are reviewed at length with the patient today.  Concerns regarding medicines are outlined above.  Orders Placed This Encounter  Procedures   EKG 12-Lead    No orders of the defined types were placed in this encounter.    Signed, Steffanie Dunn, MD, [facility], [facility] 12/08/2020 9:31 AM    Electrophysiology [facility]

## 2020-12-07 ENCOUNTER — Encounter (HOSPITAL_COMMUNITY): Payer: Self-pay | Admitting: *Deleted

## 2020-12-07 LAB — CBC WITH DIFFERENTIAL/PLATELET
Basophils Absolute: 0.1 10*3/uL (ref 0.0–0.2)
Basos: 1 %
EOS (ABSOLUTE): 0.1 10*3/uL (ref 0.0–0.4)
Eos: 1 %
Hematocrit: 46.3 % (ref 37.5–51.0)
Hemoglobin: 15.2 g/dL (ref 13.0–17.7)
Immature Grans (Abs): 0 10*3/uL (ref 0.0–0.1)
Immature Granulocytes: 0 %
Lymphocytes Absolute: 2.5 10*3/uL (ref 0.7–3.1)
Lymphs: 27 %
MCH: 27.3 pg (ref 26.6–33.0)
MCHC: 32.8 g/dL (ref 31.5–35.7)
MCV: 83 fL (ref 79–97)
Monocytes Absolute: 0.9 10*3/uL (ref 0.1–0.9)
Monocytes: 10 %
Neutrophils Absolute: 5.7 10*3/uL (ref 1.4–7.0)
Neutrophils: 61 %
Platelets: 321 10*3/uL (ref 150–450)
RBC: 5.57 x10E6/uL (ref 4.14–5.80)
RDW: 14.6 % (ref 11.6–15.4)
WBC: 9.2 10*3/uL (ref 3.4–10.8)

## 2020-12-07 LAB — COMPREHENSIVE METABOLIC PANEL
ALT: 29 IU/L (ref 0–44)
AST: 17 IU/L (ref 0–40)
Albumin/Globulin Ratio: 2 (ref 1.2–2.2)
Albumin: 4.6 g/dL (ref 4.1–5.2)
Alkaline Phosphatase: 66 IU/L (ref 44–121)
BUN/Creatinine Ratio: 14 (ref 9–20)
BUN: 17 mg/dL (ref 6–20)
Bilirubin Total: 0.3 mg/dL (ref 0.0–1.2)
CO2: 27 mmol/L (ref 20–29)
Calcium: 9.4 mg/dL (ref 8.7–10.2)
Chloride: 96 mmol/L (ref 96–106)
Creatinine, Ser: 1.25 mg/dL (ref 0.76–1.27)
Globulin, Total: 2.3 g/dL (ref 1.5–4.5)
Glucose: 141 mg/dL — ABNORMAL HIGH (ref 65–99)
Potassium: 4 mmol/L (ref 3.5–5.2)
Sodium: 139 mmol/L (ref 134–144)
Total Protein: 6.9 g/dL (ref 6.0–8.5)
eGFR: 80 mL/min/{1.73_m2} (ref >59)

## 2020-12-07 NOTE — Progress Notes (Signed)
Pt dropped off Medical Certification Form for his job, form completed and signed by Dr Shirlee Latch. Left pt message form completed and left at front desk for him to p/u

## 2020-12-08 ENCOUNTER — Encounter: Payer: Self-pay | Admitting: Cardiology

## 2020-12-08 ENCOUNTER — Other Ambulatory Visit: Payer: Self-pay

## 2020-12-08 ENCOUNTER — Ambulatory Visit (INDEPENDENT_AMBULATORY_CARE_PROVIDER_SITE_OTHER): Payer: Self-pay | Admitting: Cardiology

## 2020-12-08 VITALS — BP 116/60 | HR 63 | Ht 67.0 in | Wt 231.2 lb

## 2020-12-08 DIAGNOSIS — I5022 Chronic systolic (congestive) heart failure: Secondary | ICD-10-CM

## 2020-12-08 DIAGNOSIS — I493 Ventricular premature depolarization: Secondary | ICD-10-CM

## 2020-12-08 NOTE — Patient Instructions (Addendum)
Medication Instructions:  Your physician recommends that you continue on your current medications as directed. Please refer to the Current Medication list given to you today. *If you need a refill on your cardiac medications before your next appointment, please call your pharmacy*  Lab Work: None ordered. If you have labs (blood work) drawn today and your tests are completely normal, you will receive your results only by: MyChart Message (if you have MyChart) OR A paper copy in the mail If you have any lab test that is abnormal or we need to change your treatment, we will call you to review the results.  Testing/Procedures: None ordered.  Follow-Up: At [facility], you and your health needs are our priority.  As part of our continuing mission to provide you with exceptional heart care, we have created designated Provider Care Teams.  These Care Teams include your primary Cardiologist (physician) and Advanced Practice Providers (APPs -  Physician Assistants and Nurse Practitioners) who all work together to provide you with the care you need, when you need it.  Your next appointment:   Your physician wants you to follow-up in: 6 months with Dr. Lalla Brothers.   May 23, 2021 at 8:00 am at the [facility] office

## 2020-12-12 NOTE — Telephone Encounter (Signed)
Advanced Heart Failure Patient Advocate Encounter   Patient was approved to receive Entresto from [employer]  Patient ID: [id number] Effective dates: 12/01/20 through 12/01/21

## 2020-12-14 NOTE — Progress Notes (Signed)
Advanced Heart Failure Clinic Note    PCP: Pcp, No EP: Dr. Quentin Ore HF Cardiologist: Dr. Aundra Dubin  Reason for visit: follow up for systolic HF  HPI:  David Vang is a 30 y.o. WM with morbid obesity and family h/o cardiomyopathy, and new systolic HF.    Pt was diagnosed w/ CAP in 5/22 and prescribed a course of abx + prednisone w/o improved symptoms. Subsequently, went to [facility] on 5/29 w/ worsening symptoms + subjective fever.   In the ED, he met early sepsis criteria and CXR and CT concerning for bilateral PNA + CHF. No PE on CT. He was admitted and transferred to [facility] for further care. Placed on Vanc + cefepime. Also started on IV Lasix for CHF.   Echo showed severely reduced LVEF, 20-25% w/ global HK. GIIDM, no LVH, mild-mod MR. AoV normal. RV normal. Advanced Heart Failure consulted.   He underwent R/LHC on 10/25/20 with elevated right and left heart filling pressures, moderate pulmonary venous hypertension, low cardiac output but no CAD. cMRI with extensive airspace disease, right>left likely from recent pneumonia.  Patient was started on diuretics and GDMT.  He was also started on amiodarone and fitted with LifeVest for his PVCs. He was discharged on GDMT, weight 227 lbs.   He returned 6/22 for HF post hospitalization follow up with his wife. Overall feeling much better. Walking some for exercise, SOB with carrying groceries. Weight at home 228 pounds. Taking all medications. BP at home ~107-120/65-75. Volume up, NYHA II. Entresto started and lasix increased.  Today he returns for HF follow up. Overall feeling fine. Denies increasing SOB, CP, dizziness, edema, or PND/Orthopnea. Appetite ok. No fever or chills. Weight at home 226-228 pounds. Taking all medications.   ROS: All systems reviewed and negative except as per HPI.    Past Medical History:  Diagnosis Date   Morbid obesity ([place])    Current Outpatient Medications  Medication Sig Dispense Refill    amiodarone (PACERONE) 200 MG tablet Take 1 tablet (200 mg total) by mouth 2 (two) times daily. 180 tablet 1   amiodarone (PACERONE) 200 MG tablet Take 1 tablet (200 mg total) by mouth 2 (two) times daily. 60 tablet 2   carvedilol (COREG) 3.125 MG tablet Take 1 tablet (3.125 mg total) by mouth 2 (two) times daily with a meal. 60 tablet 2   dapagliflozin propanediol (FARXIGA) 10 MG TABS tablet Take 1 tablet (10 mg total) by mouth daily. 30 tablet 3   furosemide (LASIX) 40 MG tablet Take 1 tablet (40 mg total) by mouth 2 (two) times daily. 60 tablet 3   potassium chloride SA (KLOR-CON) 20 MEQ tablet Take 1 tablet (20 mEq total) by mouth daily. 30 tablet 2   sacubitril-valsartan (ENTRESTO) 24-26 MG Take 1 tablet by mouth 2 (two) times daily. 60 tablet 11   sertraline (ZOLOFT) 25 MG tablet Take 25 mg by mouth. Patient is taking 1/2 tablet daily.     spironolactone (ALDACTONE) 25 MG tablet Take 1 tablet (25 mg total) by mouth daily. 30 tablet 2   No current facility-administered medications for this encounter.   Allergies  Allergen Reactions   Benadryl [Diphenhydramine] Other (See Comments)    Pass out   Social History   Socioeconomic History   Marital status: Married    Spouse name: Not on file   Number of children: Not on file   Years of education: Not on file   Highest education level: Not on file  Occupational  History   Not on file  Tobacco Use   Smoking status: Never   Smokeless tobacco: Never  Substance and Sexual Activity   Alcohol use: Not Currently   Drug use: Never   Sexual activity: Not on file  Other Topics Concern   Not on file  Social History Narrative   Not on file   Social Determinants of Health   Financial Resource Strain: Medium Risk   Difficulty of Paying Living Expenses: Somewhat hard  Food Insecurity: No Food Insecurity   Worried About Running Out of Food in the Last Year: Never true   Ran Out of Food in the Last Year: Never true  Transportation Needs: No  Transportation Needs   Lack of Transportation (Medical): No   Lack of Transportation (Non-Medical): No  Physical Activity: Not on file  Stress: Not on file  Social Connections: Not on file  Intimate Partner Violence: Not on file   Family History  Problem Relation Age of Onset   Heart failure Father        dx'ed with cardiomyoopathy in his late 7's/40's, and ultimately required LVAD   BP 108/70   Pulse 70   Wt 104.5 kg (230 lb 6.4 oz)   SpO2 97%   BMI 36.09 kg/m   Wt Readings from Last 3 Encounters:  12/15/20 104.5 kg (230 lb 6.4 oz)  12/08/20 104.9 kg (231 lb 3.2 oz)  12/06/20 104.3 kg (230 lb)   PHYSICAL EXAM: General:  NAD. No resp difficulty HEENT: Normal Neck: Supple. JVP 7-8 cm. Carotids 2+ bilat; no bruits. No lymphadenopathy or thryomegaly appreciated. Cor: PMI nondisplaced. Faint heart tones, Regular rate & rhythm. No rubs, gallops or murmurs. Lungs: Clear Abdomen: Obese, soft, nontender, nondistended. No hepatosplenomegaly. No bruits or masses. Good bowel sounds. LifeVest on. Extremities: No cyanosis, clubbing, rash, edema Neuro: Alert & oriented x 3, cranial nerves grossly intact. Moves all 4 extremities w/o difficulty. Affect pleasant.  Life Vest: no treatments, average daily HR 69, ~23 hrs/day, ~9000 steps/day (personally reviewed).   ASSESSMENT & PLAN: 1. 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 Echo showed EF 20-25%, no LVH, mild-moderate MR, normal RV.  No ETOH or drugs.  RHC/LHC with no coronary disease, moderately elevated filling pressures and low cardiac output (2.2 thermo, 1.9 Fick).  Patient also noted to have very frequent PVCs.  Possible causes of CMP include PVC-mediated, viral myocarditis (URI-type symptoms recently), or familial CMP.  cMRI -  LVEF 21%. - NYHA I-II. He is not volume overloaded today. - Continue lasix 40 mg bid  + 20 mEq of KCl. - Continue Entresto 24/26 mg bid. Will not increase with low/normal BPs. BMET today. - Continue carvedilol 3.125 mg bid. - Continue spironolactone 25 mg daily. - Continue Farxiga 10 mg daily. No GU symptoms. - Off digoxin (previously stopped with elevated level). Will not restart with NYHA I-II symptoms. - Will need genetic testing for cardiomyopathy given family history. - Supect OSA, needs sleep study, but has no insurance. HFSW working on this. - Continue LifeVest. 2. PVCs: Frequent, often bigeminal.  Mexiletine tried without much effect. 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  Dr. Aundra Dubin discussed with Dr. Caryl Comes. - Continue amiodarone 200 mg bid. Will need TSH in 2 months (9/22). Not ideal long-term medication for him, but  if LV function can improve some over time may be able to stop. - Candidate for possible ablation, but will need insurance. SW working on this. - Continue Coreg 3.125 mg bid. - Follows with Dr. Quentin Ore. 3. Anxiety: Improving. Buspar stopped last visit. - Continue sertraline 25 mg daily for now.  4. Insomnia: Discussed sleep hygiene. Try taking sertraline at night. OK to try low-dose OTC melatonin.  - Avoid daytime napping. Hopefully we can get sleep study when he has insurance. 5. Obesity: Body mass index is 36.09 kg/m. - He is exercising more.   Medicaid pending.  Follow up with Dr. Aundra Dubin + echo in 2 months  Allena Katz, [facility] 12/15/20

## 2020-12-15 ENCOUNTER — Encounter (HOSPITAL_COMMUNITY): Payer: Self-pay

## 2020-12-15 ENCOUNTER — Ambulatory Visit (HOSPITAL_COMMUNITY)
Admission: RE | Admit: 2020-12-15 | Discharge: 2020-12-15 | Disposition: A | Discharge status: Home / Self Care | Payer: Self-pay | Source: Ambulatory Visit | Attending: Family Medicine | Admitting: Family Medicine

## 2020-12-15 ENCOUNTER — Telehealth (HOSPITAL_COMMUNITY): Payer: Self-pay | Admitting: Family Medicine

## 2020-12-15 ENCOUNTER — Other Ambulatory Visit (HOSPITAL_COMMUNITY): Payer: Self-pay

## 2020-12-15 ENCOUNTER — Other Ambulatory Visit: Payer: Self-pay

## 2020-12-15 VITALS — BP 108/70 | HR 70 | Wt 230.4 lb

## 2020-12-15 DIAGNOSIS — Z7984 Long term (current) use of oral hypoglycemic drugs: Secondary | ICD-10-CM | POA: Insufficient documentation

## 2020-12-15 DIAGNOSIS — I5022 Chronic systolic (congestive) heart failure: Secondary | ICD-10-CM

## 2020-12-15 DIAGNOSIS — Z888 Allergy status to other drugs, medicaments and biological substances status: Secondary | ICD-10-CM | POA: Insufficient documentation

## 2020-12-15 DIAGNOSIS — I493 Ventricular premature depolarization: Secondary | ICD-10-CM | POA: Insufficient documentation

## 2020-12-15 DIAGNOSIS — I110 Hypertensive heart disease with heart failure: Secondary | ICD-10-CM | POA: Insufficient documentation

## 2020-12-15 DIAGNOSIS — Z79899 Other long term (current) drug therapy: Secondary | ICD-10-CM | POA: Insufficient documentation

## 2020-12-15 DIAGNOSIS — R008 Other abnormalities of heart beat: Secondary | ICD-10-CM | POA: Insufficient documentation

## 2020-12-15 DIAGNOSIS — Z6836 Body mass index (BMI) 36.0-36.9, adult: Secondary | ICD-10-CM | POA: Insufficient documentation

## 2020-12-15 DIAGNOSIS — Z8249 Family history of ischemic heart disease and other diseases of the circulatory system: Secondary | ICD-10-CM | POA: Insufficient documentation

## 2020-12-15 DIAGNOSIS — F419 Anxiety disorder, unspecified: Secondary | ICD-10-CM | POA: Insufficient documentation

## 2020-12-15 DIAGNOSIS — G4700 Insomnia, unspecified: Secondary | ICD-10-CM | POA: Insufficient documentation

## 2020-12-15 LAB — BASIC METABOLIC PANEL
Anion gap: 7 (ref 5–15)
BUN: 17 mg/dL (ref 6–20)
CO2: 30 mmol/L (ref 22–32)
Calcium: 9.3 mg/dL (ref 8.9–10.3)
Chloride: 101 mmol/L (ref 98–111)
Creatinine, Ser: 1.25 mg/dL — ABNORMAL HIGH (ref 0.61–1.24)
GFR, Estimated: >60 mL/min (ref >60)
Glucose, Bld: 115 mg/dL — ABNORMAL HIGH (ref 70–99)
Potassium: 3.4 mmol/L — ABNORMAL LOW (ref 3.5–5.1)
Sodium: 138 mmol/L (ref 135–145)

## 2020-12-15 MED ORDER — POTASSIUM CHLORIDE CRYS ER 20 MEQ PO TBCR
20.0000 meq | EXTENDED_RELEASE_TABLET | Freq: Every day | ORAL | 2 refills | Status: DC
  Filled 2020-12-15: qty 30, 30d supply, fill #0

## 2020-12-15 NOTE — Patient Instructions (Addendum)
Labs done today. We will contact you only if your labs are abnormal.  No medication changes were made. Please continue all current medications as prescribed.  Your physician recommends that you keep your scheduled follow-up with Dr. Shirlee Latch on Thursday September 22nd 2022 echo at 10am and appointment with Dr. Shirlee Latch at [facility] [id number]  If you have any questions or concerns before your next appointment please send [place] a message through [facility] or call our office at [phone].    TO LEAVE A MESSAGE FOR THE NURSE SELECT OPTION 2, PLEASE LEAVE A MESSAGE INCLUDING: YOUR NAME DATE OF BIRTH CALL BACK NUMBER REASON FOR CALL**this is important as we prioritize the call backs  YOU WILL RECEIVE A CALL BACK THE SAME DAY AS LONG AS YOU CALL BEFORE 4:00 PM   Do the following things EVERYDAY: Weigh yourself in the morning before breakfast. Write it down and keep it in a log. Take your medicines as prescribed Eat low salt foods--Limit salt (sodium) to 2000 mg per day.  Stay as active as you can everyday Limit all fluids for the day to less than 2 liters   At the Advanced Heart Failure Clinic, you and your health needs are our priority. As part of our continuing mission to provide you with exceptional heart care, we have created designated Provider Care Teams. These Care Teams include your primary Cardiologist (physician) and Advanced Practice Providers (APPs- Physician Assistants and Nurse Practitioners) who all work together to provide you with the care you need, when you need it.   You may see any of the following providers on your designated Care Team at your next follow up: Dr Arvilla Meres Dr Carron Curie, NP Robbie Lis, [place] Karle Plumber, PharmD   Please be sure to bring in all your medications bottles to every appointment.

## 2020-12-15 NOTE — Telephone Encounter (Signed)
Called patient to discuss labs. LMTCB.  Prince Rome, FNP-BC

## 2020-12-18 MED ORDER — POTASSIUM CHLORIDE CRYS ER 20 MEQ PO TBCR: 20.0000 meq | EXTENDED_RELEASE_TABLET | Freq: Two times a day (BID) | ORAL | 2 refills | Status: DC

## 2020-12-18 NOTE — Addendum Note (Signed)
Addended by: Modesta Messing on: 12/18/2020 02:07 PM   Modules accepted: Orders

## 2020-12-18 NOTE — Telephone Encounter (Signed)
Pt returned call he is aware and agreeable with plan. Pt said he is taking k daily I advised pt to increase to daily. Pt verbalized understanding.  Lab appt scheduled. New rx sent to outpt pharmacy as requested.     Anderson Malta Placedo, FNP  12/15/2020  5:00 PM EDT      Potassium is low. Is he taking his K supp? If so, please add 20 KCl to dailyregimen. Will need repeat BMET in 10-14 days.

## 2020-12-21 ENCOUNTER — Other Ambulatory Visit (HOSPITAL_COMMUNITY): Payer: Self-pay

## 2020-12-21 ENCOUNTER — Other Ambulatory Visit (HOSPITAL_COMMUNITY): Payer: Self-pay | Admitting: *Deleted

## 2020-12-21 MED ORDER — POTASSIUM CHLORIDE CRYS ER 20 MEQ PO TBCR
20.0000 meq | EXTENDED_RELEASE_TABLET | Freq: Two times a day (BID) | ORAL | 2 refills | Status: DC
  Filled 2020-12-21 – 2020-12-29 (×2): qty 60, 30d supply, fill #0
  Filled 2021-01-25: qty 60, 30d supply, fill #1

## 2020-12-21 NOTE — Telephone Encounter (Addendum)
Advanced Heart Failure Patient Advocate Encounter  Patient called in left a message about getting Potassium with the HF fund, looks like Jasmine (CMA) sent that in today to [facility].  Patient wanted to confirm how to refill Entresto.  Called and left the patient a message with the phone number to [employer]. Reminded him that he would have to call each time he needed a refill.  Archer Asa, CPhT

## 2020-12-25 ENCOUNTER — Telehealth (HOSPITAL_COMMUNITY): Payer: Self-pay | Admitting: Vascular Surgery

## 2020-12-25 NOTE — Telephone Encounter (Signed)
Returned pt call to resch lab appt from 8/4 2:15 to a morning appt, lab appt was move to 8/5 @ 8:30 pt was asked to call back to confirm appt change

## 2020-12-27 ENCOUNTER — Other Ambulatory Visit (HOSPITAL_COMMUNITY): Payer: Self-pay

## 2020-12-28 ENCOUNTER — Other Ambulatory Visit (HOSPITAL_COMMUNITY): Payer: Self-pay

## 2020-12-29 ENCOUNTER — Ambulatory Visit (HOSPITAL_COMMUNITY)
Admission: RE | Admit: 2020-12-29 | Discharge: 2020-12-29 | Disposition: A | Discharge status: Home / Self Care | Payer: Self-pay | Source: Ambulatory Visit | Attending: Cardiology | Admitting: Cardiology

## 2020-12-29 ENCOUNTER — Telehealth (HOSPITAL_COMMUNITY): Payer: Self-pay | Admitting: *Deleted

## 2020-12-29 ENCOUNTER — Other Ambulatory Visit: Payer: Self-pay

## 2020-12-29 ENCOUNTER — Other Ambulatory Visit (HOSPITAL_COMMUNITY): Payer: Self-pay

## 2020-12-29 DIAGNOSIS — I5022 Chronic systolic (congestive) heart failure: Secondary | ICD-10-CM | POA: Insufficient documentation

## 2020-12-29 LAB — BASIC METABOLIC PANEL
Anion gap: 9 (ref 5–15)
BUN: 14 mg/dL (ref 6–20)
CO2: 29 mmol/L (ref 22–32)
Calcium: 8.7 mg/dL — ABNORMAL LOW (ref 8.9–10.3)
Chloride: 99 mmol/L (ref 98–111)
Creatinine, Ser: 1.1 mg/dL (ref 0.61–1.24)
GFR, Estimated: >60 mL/min (ref >60)
Glucose, Bld: 120 mg/dL — ABNORMAL HIGH (ref 70–99)
Potassium: 3.5 mmol/L (ref 3.5–5.1)
Sodium: 137 mmol/L (ref 135–145)

## 2020-12-29 NOTE — Telephone Encounter (Signed)
Medication Samples have been provided to the patient.  Drug name: entresto       Strength: 24/26        Qty: 28  LOT: [id number]  Exp.Date: 10/24  Dosing instructions: one tab twice a day  The patient has been instructed regarding the correct time, dose, and frequency of taking this medication, including desired effects and most common side effects.   Sangeeta Youse 4:46 PM 12/29/2020

## 2021-01-25 ENCOUNTER — Other Ambulatory Visit (HOSPITAL_COMMUNITY): Payer: Self-pay

## 2021-01-25 ENCOUNTER — Other Ambulatory Visit (HOSPITAL_COMMUNITY): Payer: Self-pay | Admitting: Family Medicine

## 2021-01-26 ENCOUNTER — Other Ambulatory Visit (HOSPITAL_COMMUNITY): Payer: Self-pay

## 2021-02-06 ENCOUNTER — Other Ambulatory Visit: Payer: Self-pay

## 2021-02-06 ENCOUNTER — Ambulatory Visit: Payer: Self-pay | Attending: Critical Care Medicine | Admitting: Critical Care Medicine

## 2021-02-06 ENCOUNTER — Other Ambulatory Visit (HOSPITAL_COMMUNITY): Payer: Self-pay

## 2021-02-06 ENCOUNTER — Encounter: Payer: Self-pay | Admitting: Critical Care Medicine

## 2021-02-06 ENCOUNTER — Telehealth: Payer: Self-pay | Admitting: Critical Care Medicine

## 2021-02-06 DIAGNOSIS — I493 Ventricular premature depolarization: Secondary | ICD-10-CM

## 2021-02-06 DIAGNOSIS — B349 Viral infection, unspecified: Secondary | ICD-10-CM

## 2021-02-06 DIAGNOSIS — I5022 Chronic systolic (congestive) heart failure: Secondary | ICD-10-CM

## 2021-02-06 DIAGNOSIS — F32A Depression, unspecified: Secondary | ICD-10-CM

## 2021-02-06 NOTE — Assessment & Plan Note (Signed)
Question of needing an ablation patient needs insurance coverage   Patient's not been successful getting [facility]I through [employer] [facility] I will asked my nurse case manager to look into other options

## 2021-02-06 NOTE — Assessment & Plan Note (Signed)
No real effect with sertraline and patient is improving he declined my offer to have our licensed clinical social worker connect with him he said he will give this further consideration

## 2021-02-06 NOTE — Telephone Encounter (Signed)
David Vang,  this pt has not been successful using the CSW at the advanced HF clinic for his SSDI,  he is trying [employer] [facility] but to no avail  Since he is trying for [facility] I think he cannot apply for [place] card and [place] discount?  Any thoughts on how to help mr philips

## 2021-02-06 NOTE — Progress Notes (Signed)
New Patient Office Visit  Subjective:  Patient ID: David Vang, male    DOB: Feb 18, 1991  Age: 30 y.o. MRN: [id number] Virtual Visit via Video Note  I connected with David Vang on 02/06/21 at10am by a video enabled telemedicine application and verified that I am speaking with the correct person using two identifiers.   Consent:  I discussed the limitations, risks, security and privacy concerns of performing an evaluation and management service by video visit and the availability of in person appointments. I also discussed with the patient that there may be a patient responsible charge related to this service. The patient expressed understanding and agreed to proceed.  Location of patient: Patient's at home  Location of provider: I am in the office  Persons participating in the televisit with the patient.   No one else on the call  History of Present Illness:   CC: covid symptoms,  establish PCP   HPI David Vang presents for establishment of primary care.  He was coming into the office face-to-face but has developed over the past 20 he onset of fatigue temperature of 99.8 degrees headaches but no loss of taste or smell no cough no breathing difficulty no body aches no sore throat no nausea or vomiting or abdominal complaints.  Question of COVID exposure in the family.  He had a negative home test today.  This patient recently was admitted and discharged in late May early June for new onset heart failure.  The patient did see PA McClung in July by way of a phone visit.  He has been following up closely face-to-face with cardiology since his discharge.  He does not have insurance and failed to qualify for Social Security disability.  He is working with the Clinical biochemist on this but has had a lot of barriers and seeing if he can get Medicaid.  He cannot apply for the orange card when he is in the process of applying for Medicaid disability.  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  This patient now without any insurance and without funds is beginning to feel the stress over his medication cost.  He is getting his cardiology medicines for free through the heart failure fund.  He was prescribed sertraline for anxiety however it did not make any difference in his symptoms and his anxiety symptoms have improved.  He ran out of this medicine and has not had a refill.  He still takes a very carbohydrate rich diet he has a smoothie and an egg in the morning he has Posta for lunch Posta chicken and vegetables for dinner  He does have a hemoglobin A1c of 6.1 is prediabetic.  He is on [place] as part of his medication protocol more for his heart failure.  Below is documentation from the PA visit in July.  Saw McClung 11/2020 David Vang is a 30 y.o. male here today to establish care and for a follow up visit After hospitalization 5/29-10/27/2020.  Saw cardiology in f/up 11/15/2020.  He is doing well.  Almost feels back to baseline.  SW with cardiology and pharmacy are helping him with meds.  He was denied medicaid and will get [facility] discount packets today.  He has been walking for exercise and is making lifestyle changes.  Depression has improved with 25mg  zoloft and he is tolerating it well.       From hospital discharge: Hospital Course: 30 year old M with PMH of morbid  obesity and "recurrent pneumonia" presented to ED with shortness of breath and admitted with concern for "sepsis due to bilateral pneumonia". Reportedly, he was treated outpatient with oral antibiotic and steroids 2 weeks prior to presentation.   Patient had mild temp to 99.6 and leukocytosis to 15.2.  Lactic acid was within normal.  BNP, troponin and D-dimer were marginally elevated.  EKG with ventricular bigeminy.  CTA chest negative for PE but extensive airspace lung opacities, more evident on the right, with borderline cardiomegaly and a trace right pleural  effusion concerning for asymmetric pulmonary edema or multifocal pneumonia.  COVID-19 and influenza PCR were negative.  His procalcitonin was negative.  He was started on broad-spectrum antibiotics and admitted for community-acquired pneumonia.   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  Repeat procalcitonin remain negative.  Antibiotics discontinued.    Advanced heart failure team consulted.  R/LHC on 6/1 with elevated right and left heart filling pressures, moderate pulmonary venous hypertension, low cardiac output but no CAD. cMRI with extensive airspace disease, right>left likely from recent pneumonia.  Patient was started on diuretics and GDMT.  He was also started on amiodarone and fitted with LifeVest for his PVCs.     On the day of discharge, patient felt well and ready to go home.  He ambulated in the hallway multiple times without distress.  He was cleared for discharge home on cardiac medications as below.  Outpatient follow-up arranged by cardiology and electrophysiology.   See individual problem list below for more on hospital course.   Discharge Diagnoses:  Acute combined CHF/cardiomyopathy: Likely cause of his respiratory symptoms refractory to antibiotics.  He could have viral respiratory infections.  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  Significant family history.  His TSH and HIV was within normal.  EKG with ventricular bigeminy. R/LHC and cMRI as above.  Inflammatory markers elevated. -Cleared for discharge by advanced heart failure team on Lasix 40 mg daily Coreg 3.125 mg twice a day Losartan 12.5 mg daily Spironolactone 25 mg daily Farxiga 10 mg daily Digoxin 0.125 mg daily Amio 200 mg twice a day  Also add sertraline 25 mg daily for anxiety Over his prescriptions filled at [facility] pharmacy. Counseled on sodium and fluid restrictions Recommend outpatient sleep study to rule out OSA   PVCs-frequent  bigeminy.  TSH within normal. -Discharged on amiodarone and LifeVest   Hyponatremia/hypokalemia-resolved   Community-acquired pneumonia-Ruled out.  Procalcitonin remain negative x3.  MRI finding likely residual from recent pneumonia. -Discontinued antibiotics   Diarrhea-Resolved.  C. difficile negative.   Anxiety: stable -Zoloft per cardiology -Added BuSpar as well   Leukocytosis/bandemia: Likely demargination from recent steroid versus bacterial infection.   Class II obesity Body mass index is 35.69 kg/m.  -Encourage lifestyle change to lose weight.   From cardiology note A/P 11/15/2020: ASSESSMENT & PLAN: 1. 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 Echo showed EF 20-25%, no LVH, mild-moderate MR, normal RV.  No ETOH or drugs.  RHC/LHC with no coronary disease, moderately elevated filling pressures and low cardiac output (2.2 thermo, 1.9 Fick).  Patient also noted to have very frequent PVCs.  Possible causes of CMP include PVC-mediated, viral myocarditis (URI-type symptoms recently), or familial CMP.  cMRI -  LVEF 21%. - NYHA II. Volume up today,  weight up, Reds 47%. - Increase lasix to 40 mg bid x 5 days, then back to 40  mg daily + 20 mEq of KCl daily. - Stop losartan and start Entresto 24/26 mg bid. BMET today, repeat 7-10 days. - Continue carvedilol 3.125 mg bid. - Continue spironolactone 25 mg daily. - Continue Farxiga 10 mg daily. No GU symptoms. - Continue digoxin 0.125 daily. Digoxin level today. - Will need genetic testing for cardiomyopathy given family history. - Supect OSA, needs sleep study, but has no insurance. HFSW working on this. - Continue LifeVest. 2. PVCs: Frequent, often bigeminal.  Mexiletine tried without much effect. 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  Dr. Shirlee Latch discussed with Dr. Graciela Husbands. - Continue amiodarone 200 mg bid.  Not ideal long-term medication for him, but if LV function can improve some over time may be able to stop. - Candidate for possible ablation, but will need insurance. SW working on this. - Continue Coreg 3.125 mg bid. - Discussed with EP. He has follow up in 6 weeks with Dr Lalla Brothers. 3. Anxiety: Improving. Now feels somewhat numb. - Stop buspirone. - Continue sertraline 25 mg daily for now.   - HFSW following. Started process for insurance, Medicaid pending.   Follow up in 3-4 weeks with PharmD for further medication titration.     ED/Hospital notes reviewed.    1. Acute systolic CHF (congestive heart failure) (HCC) Continue current medications and life jacket.  Continue daily weights-down 3 pounds from cardiology visit.   - Comprehensive metabolic panel   2. Sepsis, due to unspecified organism, unspecified whether acute organ dysfunction present (HCC) - CBC with Differential/Platelet   3. SOB (shortness of breath) Much improved/resolved   4. Bandemia - CBC with Differential/Platelet   5. Community acquired pneumonia, unspecified laterality resolved - CBC with Differential/Platelet   6. Depression, unspecified depression type Can increase dose - sertraline (ZOLOFT) 50 MG tablet; Take 1 tablet (50 mg total) by mouth daily.  Dispense: 30 tablet; Refill: 1   7. Hospital discharge follow-up Doing well   8.  Prediabetes-he is working on diet and exercise.  I have had a lengthy discussion and provided education about insulin resistance and the intake of too much sugar/refined carbohydrates.  I have advised the patient to work at a goal of eliminating sugary drinks, candy, desserts, sweets, refined sugars, processed foods, and white carbohydrates.  The patient expresses understanding.    Patient had no other specific complaints at this visit. Past Medical History:  Diagnosis Date   Morbid  obesity [facility])     Past Surgical History:  Procedure Laterality Date   RIGHT/LEFT HEART CATH AND CORONARY ANGIOGRAPHY N/A 10/25/2020   Procedure: RIGHT/LEFT HEART CATH AND CORONARY ANGIOGRAPHY;  Surgeon: Laurey Morale, MD;  Location: [facility] INVASIVE CV LAB;  Service: Cardiovascular;  Laterality: N/A;    Family History  Problem Relation Age of Onset   Heart failure Father        dx'ed with cardiomyoopathy in his late 30's/40's, and ultimately required LVAD    Social History   Socioeconomic History   Marital status: Married    Spouse name: Not on file   Number of children: Not on file   Years of education: Not on file   Highest education level: Not on file  Occupational History   Not on file  Tobacco Use   Smoking status: Never   Smokeless tobacco:  Never  Substance and Sexual Activity   Alcohol use: Not Currently   Drug use: Never   Sexual activity: Not on file  Other Topics Concern   Not on file  Social History Narrative   Not on file   Social Determinants of Health   Financial Resource Strain: Medium Risk   Difficulty of Paying Living Expenses: Somewhat hard  Food Insecurity: No Food Insecurity   Worried About Running Out of Food in the Last Year: Never true   Ran Out of Food in the Last Year: Never true  Transportation Needs: No Transportation Needs   Lack of Transportation (Medical): No   Lack of Transportation (Non-Medical): No  Physical Activity: Not on file  Stress: Not on file  Social Connections: Not on file  Intimate Partner Violence: Not on file    ROS Review of Systems  Constitutional:  Negative for chills, diaphoresis and fever.  HENT:  Negative for congestion, ear discharge, ear pain, hearing loss, nosebleeds, sore throat and tinnitus.   Eyes:  Negative for photophobia and discharge.  Respiratory:  Negative for cough, shortness of breath, wheezing and stridor.        No excess mucus  Cardiovascular:  Negative for chest pain, palpitations and leg  swelling.  Gastrointestinal:  Negative for abdominal pain, blood in stool, constipation, diarrhea, nausea and vomiting.  Endocrine: Negative for polydipsia.  Genitourinary:  Negative for dysuria, flank pain, frequency, hematuria and urgency.  Musculoskeletal:  Negative for back pain, myalgias and neck pain.  Skin:  Negative for rash.  Allergic/Immunologic: Negative for environmental allergies.  Neurological:  Negative for dizziness, tremors, seizures, weakness and headaches.  Hematological:  Does not bruise/bleed easily.  Psychiatric/Behavioral: Negative.  Negative for hallucinations and suicidal ideas. The patient is not nervous/anxious.   All other systems reviewed and are negative.  Objective:   Today's Vitals: There were no vitals taken for this visit. No exam this is a video visit on the video the patient is in no acute distress at home Physical Exam  Assessment & Plan:   Problem List Items Addressed This Visit       Cardiovascular and Mediastinum   Chronic systolic heart failure (HCC)    chronic systolic heart failure with negative coronary disease elevated pulmonary artery pressures  Management per cardiology no change in medications all medicines being provided by cardiology for the heart failure fund  Patient does need to get a TSH and we also need to do a hepatitis C screen we can accomplish this when he comes to the office face-to-face      PVC (premature ventricular contraction)    Question of needing an ablation patient needs insurance coverage   Patient's not been successful [facility] I will asked my nurse case manager to look into other options        Other   Depression    No real effect with sertraline and patient is improving he declined my offer to have our licensed clinical social worker connect with him he said he will give this further consideration      Viral syndrome    Current acute viral syndrome I suspect this could be  COVID or it may be another unrelated virus.  The patient will continue to test for COVID for the next 2 days and report to me results.  If he turns positive he would benefit from one of the new oral antivirals probably molnupirvar becausePaxlovid has too many drug interactions  Outpatient Encounter Medications as of 02/06/2021  Medication Sig   amiodarone (PACERONE) 200 MG tablet Take 1 tablet (200 mg total) by mouth 2 (two) times daily.   amiodarone (PACERONE) 200 MG tablet Take 1 tablet (200 mg total) by mouth 2 (two) times daily.   carvedilol (COREG) 3.125 MG tablet Take 1 tablet (3.125 mg total) by mouth 2 (two) times daily with a meal.   dapagliflozin propanediol (FARXIGA) 10 MG TABS tablet Take 1 tablet (10 mg total) by mouth daily.   furosemide (LASIX) 40 MG tablet Take 1 tablet (40 mg total) by mouth 2 (two) times daily.   potassium chloride SA (KLOR-CON) 20 MEQ tablet Take 1 tablet (20 mEq total) by mouth 2 (two) times daily.   sacubitril-valsartan (ENTRESTO) 24-26 MG Take 1 tablet by mouth 2 (two) times daily.   sertraline (ZOLOFT) 25 MG tablet Take 25 mg by mouth. Patient is taking 1/2 tablet daily.   spironolactone (ALDACTONE) 25 MG tablet Take 1 tablet (25 mg total) by mouth daily.   No facility-administered encounter medications on file as of 02/06/2021.    Follow-up: Return in about 2 months (around 04/08/2021).   Follow Up Instructions: Patient knows a follow-up exam will occur in the next 2 months face-to-face Patient is to pick up a COVID test and repeated over the next 2 days and determine if he is negative or not on COVID and notify me through MyChart    I discussed the assessment and treatment plan with the patient. The patient was provided an opportunity to ask questions and all were answered. The patient agreed with the plan and demonstrated an understanding of the instructions.   The patient was advised to call back or seek an in-person evaluation if the  symptoms worsen or if the condition fails to improve as anticipated.  I provided 42 minutes of non-face-to-face time during this encounter  including  median intraservice time , review of notes, labs, imaging, medications  and explaining diagnosis and management to the patient .   Shan Levans, MD

## 2021-02-06 NOTE — Assessment & Plan Note (Addendum)
chronic systolic heart failure with negative coronary disease elevated pulmonary artery pressures  Management per cardiology no change in medications all medicines being provided by cardiology for the heart failure fund  Patient does need to get a TSH and we also need to do a hepatitis C screen we can accomplish this when he comes to the office face-to-face

## 2021-02-06 NOTE — Patient Instructions (Addendum)
Follow-up visit will be in 2 months   Please message Dr. Delford Field with your home COVID test result

## 2021-02-06 NOTE — Assessment & Plan Note (Signed)
Current acute viral syndrome I suspect this could be COVID or it may be another unrelated virus.  The patient will continue to test for COVID for the next 2 days and report to me results.  If he turns positive he would benefit from one of the new oral antivirals probably molnupirvar becausePaxlovid has too many drug interactions

## 2021-02-07 NOTE — Telephone Encounter (Signed)
Attempted to contact patient again to discuss disability application and need to apply for [facility] Financial Assistance/ [employer] - message left with call back requested to this CM.

## 2021-02-08 NOTE — Telephone Encounter (Signed)
Attempted to contact patient again  # [phone] to discuss disability application ( this has already been submitted by [facility]) and need to apply for [facility] Financial Assistance/ [employer] - message left with call back requested to this CM.

## 2021-02-13 NOTE — Telephone Encounter (Signed)
Pt returning call to [facility]. He also states that he is interested in the therapy that was mentioned to him at his appt. Please advise.

## 2021-02-13 NOTE — Telephone Encounter (Signed)
He is referring to referral to [facility],  she was not previously able to reach him I am routing this to [facility]: can you try to reach mr Bryner again ?

## 2021-02-15 ENCOUNTER — Encounter (HOSPITAL_COMMUNITY): Payer: Self-pay | Admitting: Cardiology

## 2021-02-15 ENCOUNTER — Other Ambulatory Visit: Payer: Self-pay

## 2021-02-15 ENCOUNTER — Other Ambulatory Visit (HOSPITAL_COMMUNITY): Payer: Self-pay

## 2021-02-15 ENCOUNTER — Ambulatory Visit (HOSPITAL_COMMUNITY)
Admission: RE | Admit: 2021-02-15 | Discharge: 2021-02-15 | Disposition: A | Discharge status: Home / Self Care | Payer: Self-pay | Source: Ambulatory Visit | Attending: Cardiology | Admitting: Cardiology

## 2021-02-15 ENCOUNTER — Ambulatory Visit (HOSPITAL_BASED_OUTPATIENT_CLINIC_OR_DEPARTMENT_OTHER)
Admission: RE | Admit: 2021-02-15 | Discharge: 2021-02-15 | Disposition: A | Discharge status: Home / Self Care | Payer: Self-pay | Source: Ambulatory Visit | Attending: Cardiology | Admitting: Cardiology

## 2021-02-15 VITALS — BP 122/80 | HR 76 | Wt 239.6 lb

## 2021-02-15 DIAGNOSIS — R008 Other abnormalities of heart beat: Secondary | ICD-10-CM | POA: Insufficient documentation

## 2021-02-15 DIAGNOSIS — I5022 Chronic systolic (congestive) heart failure: Secondary | ICD-10-CM | POA: Insufficient documentation

## 2021-02-15 DIAGNOSIS — Z7984 Long term (current) use of oral hypoglycemic drugs: Secondary | ICD-10-CM | POA: Insufficient documentation

## 2021-02-15 DIAGNOSIS — G4700 Insomnia, unspecified: Secondary | ICD-10-CM | POA: Insufficient documentation

## 2021-02-15 DIAGNOSIS — Z596 Low income: Secondary | ICD-10-CM | POA: Insufficient documentation

## 2021-02-15 DIAGNOSIS — Z888 Allergy status to other drugs, medicaments and biological substances status: Secondary | ICD-10-CM | POA: Insufficient documentation

## 2021-02-15 DIAGNOSIS — Z79899 Other long term (current) drug therapy: Secondary | ICD-10-CM | POA: Insufficient documentation

## 2021-02-15 DIAGNOSIS — Z8249 Family history of ischemic heart disease and other diseases of the circulatory system: Secondary | ICD-10-CM | POA: Insufficient documentation

## 2021-02-15 DIAGNOSIS — Z6837 Body mass index (BMI) 37.0-37.9, adult: Secondary | ICD-10-CM | POA: Insufficient documentation

## 2021-02-15 LAB — COMPREHENSIVE METABOLIC PANEL
ALT: 27 U/L (ref 0–44)
AST: 19 U/L (ref 15–41)
Albumin: 3.8 g/dL (ref 3.5–5.0)
Alkaline Phosphatase: 74 U/L (ref 38–126)
Anion gap: 7 (ref 5–15)
BUN: 18 mg/dL (ref 6–20)
CO2: 29 mmol/L (ref 22–32)
Calcium: 9.1 mg/dL (ref 8.9–10.3)
Chloride: 102 mmol/L (ref 98–111)
Creatinine, Ser: 1.05 mg/dL (ref 0.61–1.24)
GFR, Estimated: >60 mL/min (ref >60)
Glucose, Bld: 98 mg/dL (ref 70–99)
Potassium: 4.2 mmol/L (ref 3.5–5.1)
Sodium: 138 mmol/L (ref 135–145)
Total Bilirubin: 1.2 mg/dL (ref 0.3–1.2)
Total Protein: 6.9 g/dL (ref 6.5–8.1)

## 2021-02-15 LAB — TSH: TSH: 2.516 u[IU]/mL (ref 0.350–4.500)

## 2021-02-15 LAB — ECHOCARDIOGRAM COMPLETE: S' Lateral: 5.7 cm

## 2021-02-15 MED ORDER — AMIODARONE HCL 200 MG PO TABS
200.0000 mg | ORAL_TABLET | Freq: Every day | ORAL | 2 refills | Status: DC
  Filled 2021-02-15: qty 30, 30d supply, fill #0
  Filled 2021-04-10: qty 30, 30d supply, fill #1
  Filled 2021-05-07: qty 30, 30d supply, fill #2

## 2021-02-15 MED ORDER — CARVEDILOL 6.25 MG PO TABS
6.2500 mg | ORAL_TABLET | Freq: Two times a day (BID) | ORAL | 2 refills | Status: DC
  Filled 2021-02-15: qty 60, 30d supply, fill #0
  Filled 2021-03-19: qty 60, 30d supply, fill #1
  Filled 2021-04-23: qty 60, 30d supply, fill #2

## 2021-02-15 NOTE — Patient Instructions (Addendum)
Labs done today. We will contact you only if your labs are abnormal.  DECREASE Amiodarone 200mg  (1 tablet) by mouth daily .  INCREASE Carvedilol to 6.25mg  (1 tablet) by mouth 2 times daily.   No other medication changes were made. Please continue all current medications as prescribed.  Genetic test has been done, this has to be sent to to be processed and can take 1-2 weeks to get results back.  We will let you know the results.  Your physician has recommended that you have a cardiopulmonary stress test (CPX). CPX testing is a non-invasive measurement of heart and lung function. It replaces a traditional treadmill stress test. This type of test provides a tremendous amount of information that relates not only to your present condition but also for future outcomes. This test combines measurements of you ventilation, respiratory gas exchange in the lungs, electrocardiogram (EKG), blood pressure and physical response before, during, and following an exercise protocol. Please refer to the handout that was provided to you during your appointment today.   Your physician recommends that you schedule a follow-up appointment in: 2 weeks with our Clinic Pharmacist and in 2 months with Dr. [place].  If you have any questions or concerns before your next appointment please send Shirlee Latch a message through [place] or call our office at [phone].    TO LEAVE A MESSAGE FOR THE NURSE SELECT OPTION 2, PLEASE LEAVE A MESSAGE INCLUDING: YOUR NAME DATE OF BIRTH CALL BACK NUMBER REASON FOR CALL**this is important as we prioritize the call backs  YOU WILL RECEIVE A CALL BACK THE SAME DAY AS LONG AS YOU CALL BEFORE 4:00 PM   Do the following things EVERYDAY: Weigh yourself in the morning before breakfast. Write it down and keep it in a log. Take your medicines as prescribed Eat low salt foods--Limit salt (sodium) to 2000 mg per day.  Stay as active as you can everyday Limit all fluids for the day to  less than 2 liters   At the [facility], you and your health needs are our priority. As part of our continuing mission to provide you with exceptional heart care, we have created designated Provider Care Teams. These Care Teams include your primary Cardiologist (physician) and Advanced Practice Providers (APPs- Physician Assistants and Nurse Practitioners) who all work together to provide you with the care you need, when you need it.   You may see any of the following providers on your designated Care Team at your next follow up: Dr [phone] Dr Arvilla Meres, NP Carron Curie, Robbie Lis [place], PharmD   Please be sure to bring in all your medications bottles to every appointment.

## 2021-02-15 NOTE — Progress Notes (Signed)
Heart and Vascular Care Navigation  02/15/2021  David Vang 09-28-1990 [id number]  Reason for Referral: lack of insurance/applying for disability   Engaged with patient face to face for follow up visit for Heart and Vascular Care Coordination.                                                                                                   Assessment:    CSW referred to check in with pt about current lack of insurance and concerns with disability/medicaid process.  Pt had applied for medicaid with help from [place] but told CSW that he was denied and [employer] said there was nothing they could do.  Also has pending disability case with [facility].  Wants to return to work but being told his heart is still weak- feeling conflicted what to do.  Offered support and validation for difficult situation.  Currently no immediate financial concerns because family has been helping him and his wife but thinks he will not being able to continue this for much longer- CSW encouraged pt to reach out if they were unable to afford something and we could consider helping them with the patient care fund.                                 HRT/VAS Care Coordination     Patients Home Cardiology Office Heart Failure Clinic   Outpatient Care Team Social Worker   Social Worker Name: Lasandra Beech, [place] [phone]   Living arrangements for the past 2 months Apartment   Lives with: Spouse   Patient Current Insurance Coverage Self-Pay   Patient Has Concern With Paying Medical Bills Yes   Patient Concerns With Medical Bills no insurance   Medical Bill Referrals: [employer]   Does Patient Have Prescription Coverage? No   Patient Prescription Assistance Programs Heart Failure [employer] [place] Assistive Devices/Equipment Scales       Social History:                                                                             SDOH Screenings   Alcohol Screen: Not on file  Depression (PHQ2-9): Medium  Risk   PHQ-2 Score: 5  Financial Resource Strain: Medium Risk   Difficulty of Paying Living Expenses: Somewhat hard  Food Insecurity: No Food Insecurity   Worried About Programme researcher, broadcasting/film/video in the Last Year: Never true   Ran Out of Food in the Last Year: Never true  Housing: Low Risk    Last Housing Risk Score: 0  Physical Activity: Not on file  Social Connections: Not on file  Stress: Not on file  Tobacco Use: Low Risk    Smoking Tobacco Use: Never   Smokeless Tobacco  Use: Never  Transportation Needs: No Transportation Needs   Lack of Transportation (Medical): No   Lack of Transportation (Non-Medical): No    SDOH Interventions: Financial Resources:  Financial Strain Interventions: Other (Comment) ([employer]) Financial Counseling for [employer] Program  Food Insecurity:   None reported  Housing Insecurity:   None reported  Transportation:    None reported    Follow-up plan:    CSW to follow up with [employer], [employer], and [employer] workers to inquire about progress for pt.  Pt will work on [employer] to help with [facility] bills  Burna Sis, LCSW Clinical Social Worker [facility] Desk#: [phone] Cell#: [phone]

## 2021-02-16 ENCOUNTER — Telehealth (HOSPITAL_COMMUNITY): Payer: Self-pay | Admitting: Licensed Clinical Social Worker

## 2021-02-16 NOTE — Telephone Encounter (Addendum)
CSW assisting in following up regarding assistance questions:  Emailed financial counseling regarding Medicaid case- pt was determined to be ineligible due to being over resource limit and not meeting criteria for 12 month disability per their screening.  Sent message to [facility] to touch base about disability case- pt case is still pending they will call SSA to inquire about status then update pt  Emailed financial counselor with question about required paperwork for CAFA application as pt wife owns her own business  Will continue to follow and assist as needed  Burna Sis, LCSW Clinical Social Worker Advanced Heart Failure Clinic Desk#: [phone] Cell#: [phone]

## 2021-02-17 NOTE — Progress Notes (Signed)
Advanced Heart Failure Clinic Note    PCP: Elsie Stain, MD EP: Dr. Quentin Ore HF Cardiologist: Dr. Aundra Dubin  HPI:  David Vang is a 30 y.o. WM with morbid obesity and family h/o cardiomyopathy, and new systolic HF.  Patient's father had a nonischemic cardiomyopathy and was implanted with a LVAD in his 32s.    Pt was diagnosed w/ CAP in 5/22 and prescribed a course of abx + prednisone w/o improved symptoms. Subsequently, went to [place] on 10/22/20 w/ worsening symptoms + subjective fever.  In the ED, he met early sepsis criteria and CXR and CT concerning for bilateral PNA + CHF. No PE on CT. He was admitted and transferred to [facility] for further care. Placed on Vanc + cefepime. Also started on IV Lasix for CHF.  Echo showed severely reduced LVEF, 20-25% w/ global HK, no LVH, mild-mod MR, RV normal. Advanced Heart Failure consulted.   He underwent R/LHC on 10/25/20 with elevated right and left heart filling pressures, moderate pulmonary venous hypertension, low cardiac output but no CAD. cMRI with extensive airspace disease, right>left likely from recent pneumonia, LV severely dilated with EF 21%, nonspecific RV insertion site LGE.  Patient was started on diuretics and GDMT.  He was also started on amiodarone and fitted with LifeVest for his PVCs. He was discharged on GDMT, weight 227 lbs.   Echo was done today and reviewed, EF 25-30%, severe LV dilation, normal RV.    Today he returns for HF follow up. Weight is up 9 lbs.  He is wearing the [place] "fine" generally.  Went to [place] recently and walked 25 miles over several days.  No lightheadedness. He can climb a flight of stairs without dyspnea.  No orthopnea/PND.  He does not feel palpitations.     ECG (personally reviewed): NSR, iLBBB (110 msec)  Labs (8/22): K 3.5, creatinine 1.1  PMH: 1. Chronic systolic CHF: Echo (7/82) with EF 20-25%, diffuse hypokinesis, mild-moderate MR, normal RV.  - LHC/RHC (6/22): No  CAD; mean RA 9, PA 59/25, mean PCWP 24, CI 1.9 (Fick), CI 2.2 (thermo). PVR 2.7 WU.  - Cardiac MRI (6/22): Severe LV dilation, EF 21%, RV EF 35%, nonspecific RV insertion site LGE.  - Echo (9/22): EF 25-30%, severe LV dilation, global hypokinesis, normal RV.  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   Social History   Socioeconomic History   Marital status: Married    Spouse name: Not on file   Number of children: Not on file   Years of education: Not on file   Highest education level: Not on file  Occupational History   Not on file  Tobacco Use   Smoking status: Never   Smokeless tobacco: Never  Substance and Sexual Activity   Alcohol use: Not Currently   Drug use: Never   Sexual activity: Not on file  Other Topics Concern   Not on file  Social History Narrative   Not on file   Social Determinants of Health   Financial Resource Strain: Medium Risk   Difficulty of Paying Living Expenses: Somewhat hard  Food Insecurity: No Food Insecurity   Worried About Running Out of Food in the Last Year: Never true   Ran Out of Food in the Last Year: Never true  Transportation Needs: No Transportation Needs   Lack of Transportation (Medical): No   Lack of Transportation (Non-Medical): No  Physical Activity: Not on file  Stress: Not on file  Social Connections: Not on  file  Intimate Partner Violence: Not on file   Family History  Problem Relation Age of Onset   Heart failure Father        dx'ed with cardiomyoopathy in his late 27's/40's, and ultimately required LVAD    ROS: All systems reviewed and negative except as per HPI.     Current Outpatient Medications  Medication Sig Dispense Refill   dapagliflozin propanediol (FARXIGA) 10 MG TABS tablet Take 1 tablet (10 mg total) by mouth daily. 30 tablet 3   furosemide (LASIX) 40 MG tablet Take 1 tablet (40 mg total) by mouth 2 (two) times daily. 60 tablet 3   potassium chloride SA (KLOR-CON) 20 MEQ tablet Take 1 tablet (20 mEq total) by  mouth 2 (two) times daily. 60 tablet 2   sacubitril-valsartan (ENTRESTO) 24-26 MG Take 1 tablet by mouth 2 (two) times daily. 60 tablet 11   spironolactone (ALDACTONE) 25 MG tablet Take 1 tablet (25 mg total) by mouth daily. 30 tablet 2   amiodarone (PACERONE) 200 MG tablet Take 1 tablet (200 mg total) by mouth daily. 30 tablet 2   carvedilol (COREG) 6.25 MG tablet Take 1 tablet (6.25 mg total) by mouth 2 (two) times daily with a meal. 60 tablet 2   sertraline (ZOLOFT) 25 MG tablet Take 25 mg by mouth. Patient is taking 1/2 tablet daily. (Patient not taking: Reported on 02/15/2021)     No current facility-administered medications for this encounter.   Allergies  Allergen Reactions   Benadryl [Diphenhydramine] Other (See Comments)    Pass out    BP 122/80   Pulse 76   Wt 108.7 kg (239 lb 9.6 oz)   SpO2 98%   BMI 37.53 kg/m   Wt Readings from Last 3 Encounters:  02/15/21 108.7 kg (239 lb 9.6 oz)  12/15/20 104.5 kg (230 lb 6.4 oz)  12/08/20 104.9 kg (231 lb 3.2 oz)   PHYSICAL EXAM: General: NAD Neck: No JVD, no thyromegaly or thyroid nodule.  Lungs: Clear to auscultation bilaterally with normal respiratory effort. CV: Nondisplaced PMI.  Heart regular S1/S2, no S3/S4, no murmur.  No peripheral edema.  No carotid bruit.  Normal pedal pulses.  Abdomen: Soft, nontender, no hepatosplenomegaly, no distention.  Skin: Intact without lesions or rashes.  Neurologic: Alert and oriented x 3.  Psych: Normal affect. Extremities: No clubbing or cyanosis.  HEENT: Normal.   ASSESSMENT & PLAN: 1. 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 Echo in 11-05-22 showed EF 20-25%, no LVH, mild-moderate MR, normal RV.  No ETOH or drugs.  RHC/LHC with no coronary disease, moderately elevated filling pressures and low cardiac output (2.2 thermo, 1.9 Fick).  Patient also noted to have very  frequent PVCs.  Cardiac MRI showed severe LV dilation, EF 21%, nonspecific RV insertion site LGE.  Possible causes of CMP include PVC-mediated, viral myocarditis, or familial CMP.  Echo was done today, EF still low at 25-30%.  NYHA class I-II symptoms.  Not volume overloaded on exam.  - Continue lasix 40 mg bid + 20 mEq of KCl.  BMET today.  - Continue Entresto 24/26 mg bid.  - Increase Coreg to 6.25 mg bid.  - Continue spironolactone 25 mg daily. - Continue Farxiga 10 mg daily.  - Off digoxin (previously stopped with elevated level). Will not restart with NYHA I-II symptoms. - Send off [facility] gene testing today given family  history of cardiomyopathy.  - Suspect OSA, needs sleep study, but has no insurance. HFSW working on this. - Continue LifeVest.  I will repeat echo in 3 more months or so after maximal titration of GDMT.  If EF still low, should get ICD (narrow QRS so no CRT).  - I will arrange for CPX for objective assessment of functional capacity.  2. PVCs: Frequent, often bigeminal.  Mexiletine tried without much effect. 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  - Decrease amiodarone to 200 mg daily.  Check LFTs and TSH.  He will need a regular eye exam.  - Possible candidate for PVC ablation, but will need insurance. SW working on this.  Followup with Dr. Quentin Ore.  3. Insomnia: Discussed sleep hygiene. Try taking sertraline at night. OK to try low-dose OTC melatonin.  - Avoid daytime napping. Hopefully we can get sleep study when he has insurance. 4. Obesity: Body mass index is 37.53 kg/m. - He is exercising more.   Medicaid pending.  Follow up with HF pharmacist in 3 weeks for medication titration x 2 visits, then see me in 2 months.   Loralie Champagne 02/17/2021

## 2021-02-27 NOTE — Progress Notes (Signed)
PCP: Elsie Stain, MD EP: Dr. Quentin Ore HF Cardiologist: Dr. Aundra Dubin  HPI:  David Vang is a 30 y.o. WM with morbid obesity and family h/o cardiomyopathy, and new systolic HF.  Patient's father had a nonischemic cardiomyopathy and was implanted with a LVAD in his 42s.    Pt was diagnosed with CAP in 09/2020 and prescribed a course of abx + prednisone w/o improved symptoms. Subsequently, went to [place] on 10/22/20 w/ worsening symptoms + subjective fever.  In the ED, he met early sepsis criteria and CXR and CT concerning for bilateral PNA + CHF. No PE on CT. He was admitted and transferred to [facility] for further care. Placed on Vanc + cefepime. Also started on IV Lasix for CHF.  Echo showed severely reduced LVEF, 20-25% with global HK, no LVH, mild-mod MR, RV normal. Advanced Heart Failure consulted.   He underwent R/LHC on 10/25/20 with elevated right and left heart filling pressures, moderate pulmonary venous hypertension, low cardiac output but no CAD. cMRI with extensive airspace disease, right>left likely from recent pneumonia, LV severely dilated with EF 21%, nonspecific RV insertion site LGE.  Patient was started on diuretics and GDMT.  He was also started on amiodarone and fitted with LifeVest for his PVCs. He was discharged on GDMT, weight 227 lbs.    Echo 02/15/21, EF 25-30%, severe LV dilation, normal RV.     Recently returned to HF Clinic for follow up on 02/15/21. Weight was up 9 lbs.  He was wearing the [place] "fine" generally.  Went to [place] recently and walked 25 miles over several days.  No lightheadedness. He reported being able to climb a flight of stairs without dyspnea.  No orthopnea/PND.  He does not feel palpitations.   Today he returns to HF clinic for pharmacist medication titration. At last visit with MD, carvedilol was increased to 6.25 mg BID and amiodarone was decreased to 200 mg daily. He felt "a little off" for the first few days after increasing  carvedilol, but doing better now. Overall, he reports doing well. No dizziness, lightheadedness, or fatigue. No chest pain or palpitations. No SOB/dyspnea. Reports improvement with dyspnea climbing stairs and has been walking 2 miles around his complex every day. Home BPs usually around 115/70. BP was 118/70 today. Weight at home has been ~240 lbs. Weight has been slowly increasing over the labs few months. States he feels this is related to diet and gaining back the weight he lost in the hospital and is not related to fluid. Takes furosemide 40 mg BID and has not had to take any additional doses. No LEE, PND or orthopnea. He tries to limit salt to 1500 mg/day and has been "more mindful of sugar" intake as well.    HF Medications: Carvedilol 6.25 mg BID Entresto 24/26 mg BID Spironolactone 25 mg daily Farxiga 10 mg daily Furosemide 40 BID + KCl 20 mEq BID   Has the patient been experiencing any side effects to the medications prescribed? No  Does the patient have any problems obtaining medications due to transportation or finances?   No insurance. Maryan Puls through [employer] PAP and [place] through [employer] P[employer]. Receives other HF medications through [facility] Fund at [facility].   Understanding of regimen: good Understanding of indications: good Potential of compliance: excellent Patient understands to avoid NSAIDs. Patient understands to avoid decongestants.    Pertinent Lab Values: 02/15/2021: Serum creatinine 1.05, BUN 18, Potassium 4.2, Sodium 138  Vital Signs: Weight: 244.6  lbs (last clinic weight: 239.6 lbs) Blood pressure: 118/76  Heart rate: 77   Assessment/Plan: 1. 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 Echo in 10/31/20 showed EF 20-25%, no LVH, mild-moderate MR, normal RV.  No ETOH or drugs.  RHC/LHC with no coronary disease, moderately elevated filling pressures  and low cardiac output (2.2 thermo, 1.9 Fick).  Patient also noted to have very frequent PVCs.  Cardiac MRI showed severe LV dilation, EF 21%, nonspecific RV insertion site LGE.  Possible causes of CMP include PVC-mediated, viral myocarditis, or familial CMP.  Echo 02/15/21, EF still low at 25-30%.   -NYHA class I-II symptoms. Not volume overloaded on exam.  - Continue furosemide 40 mg BID + 20 mEq of KCl BID. - Continue carvedilol 6.25 mg BID.  - Increase Entresto to 49/51 mg BID. Repeat BMET in 3 weeks.  - Continue spironolactone 25 mg daily. - Continue Farxiga 10 mg daily.  - Off digoxin (previously stopped with elevated level). Will not restart with NYHA I-II symptoms. - Previously sent off [employer] gene testing given family history of cardiomyopathy.  - Suspected OSA, needs sleep study, but has no insurance. HFSW working on this. - Continue LifeVest.  Plan to repeat echo in 3 more months or so after maximal titration of GDMT. If EF still low, should get ICD (narrow QRS so no CRT).  - CPX today for objective assessment of functional capacity. Results pending.  2. PVCs: Frequent, often bigeminal.  Mexiletine tried without much effect. Given family history, suspect familial CMP but the PVCs can certainly make the LV function worse and will need suppression.  - Continue amiodarone to 200 mg daily.    - Possible candidate for PVC ablation, but will need insurance. SW working on this.  Followup with Dr. Quentin Ore.  3. Insomnia: Discussed sleep hygiene. Try taking sertraline at night. OK to try low-dose OTC melatonin.  - Avoid daytime napping. Hopefully can get sleep study when he has insurance. 4. Obesity: Body mass index is 37.53 kg/m. - He is exercising more.   Follow up with pharmacy clinic in 3 weeks.   Audry Riles, PharmD, BCPS, BCCP, CPP Heart Failure Clinic Pharmacist [phone]

## 2021-03-01 ENCOUNTER — Other Ambulatory Visit (HOSPITAL_COMMUNITY): Payer: Self-pay

## 2021-03-01 ENCOUNTER — Ambulatory Visit (HOSPITAL_COMMUNITY)
Admission: RE | Admit: 2021-03-01 | Discharge: 2021-03-01 | Disposition: A | Discharge status: Home / Self Care | Payer: Self-pay | Source: Ambulatory Visit | Attending: Cardiology | Admitting: Cardiology

## 2021-03-01 ENCOUNTER — Ambulatory Visit (HOSPITAL_COMMUNITY): Payer: Self-pay

## 2021-03-01 ENCOUNTER — Other Ambulatory Visit: Payer: Self-pay

## 2021-03-01 VITALS — BP 118/76 | HR 77 | Wt 244.6 lb

## 2021-03-01 DIAGNOSIS — I5022 Chronic systolic (congestive) heart failure: Secondary | ICD-10-CM

## 2021-03-01 DIAGNOSIS — G4700 Insomnia, unspecified: Secondary | ICD-10-CM | POA: Insufficient documentation

## 2021-03-01 DIAGNOSIS — I428 Other cardiomyopathies: Secondary | ICD-10-CM | POA: Insufficient documentation

## 2021-03-01 DIAGNOSIS — E669 Obesity, unspecified: Secondary | ICD-10-CM | POA: Insufficient documentation

## 2021-03-01 DIAGNOSIS — Z6837 Body mass index (BMI) 37.0-37.9, adult: Secondary | ICD-10-CM | POA: Insufficient documentation

## 2021-03-01 MED ORDER — POTASSIUM CHLORIDE CRYS ER 20 MEQ PO TBCR
20.0000 meq | EXTENDED_RELEASE_TABLET | Freq: Two times a day (BID) | ORAL | 5 refills | Status: DC
  Filled 2021-03-01: qty 60, 30d supply, fill #0
  Filled 2021-04-02: qty 60, 30d supply, fill #1

## 2021-03-01 MED ORDER — SACUBITRIL-VALSARTAN 49-51 MG PO TABS: 1.0000 | ORAL_TABLET | Freq: Two times a day (BID) | ORAL | 3 refills | Status: DC

## 2021-03-01 NOTE — Patient Instructions (Addendum)
It was a pleasure seeing you today! ° °MEDICATIONS: °-We are changing your medications today °-Increase Entresto to 49/51 mg (1 tablet) twice daily. You may take 2 tablets of the 24/26 mg strength twice daily until you receive the new strength.  °-Call if you have questions about your medications. ° ° °NEXT APPOINTMENT: °Return to clinic in 4 weeks with Pharmacy Clinic.  ° °In general, to take care of your heart failure: °-Limit your fluid intake to 2 Liters (half-gallon) per day.   °-Limit your salt intake to ideally 2-3 grams (2000-3000 mg) per day. °-Weigh yourself daily and record, and bring that "weight diary" to your next appointment.  (Weight gain of 2-3 pounds in 1 day typically means fluid weight.) °-The medications for your heart are to help your heart and help you live longer.   °-Please contact us before stopping any of your heart medications. ° °Call the clinic at [phone] with questions or to reschedule future appointments.  °

## 2021-03-02 NOTE — Telephone Encounter (Signed)
First attempt to reach pt, no answer, left vm.

## 2021-03-09 ENCOUNTER — Other Ambulatory Visit (HOSPITAL_COMMUNITY): Payer: Self-pay

## 2021-03-12 NOTE — Telephone Encounter (Signed)
Spoke with pt and scheduled appt for 04/02/21 at 1:30

## 2021-03-19 ENCOUNTER — Other Ambulatory Visit (HOSPITAL_COMMUNITY): Payer: Self-pay

## 2021-03-23 ENCOUNTER — Inpatient Hospital Stay (HOSPITAL_COMMUNITY): Admission: RE | Admit: 2021-03-23 | Payer: Self-pay | Source: Ambulatory Visit

## 2021-03-28 ENCOUNTER — Inpatient Hospital Stay (HOSPITAL_COMMUNITY): Admission: RE | Admit: 2021-03-28 | Payer: Self-pay | Source: Ambulatory Visit

## 2021-03-29 ENCOUNTER — Inpatient Hospital Stay (HOSPITAL_COMMUNITY): Admission: RE | Admit: 2021-03-29 | Payer: Self-pay | Source: Ambulatory Visit

## 2021-04-02 ENCOUNTER — Other Ambulatory Visit (HOSPITAL_COMMUNITY): Payer: Self-pay

## 2021-04-02 ENCOUNTER — Institutional Professional Consult (permissible substitution): Payer: Self-pay | Admitting: Clinical

## 2021-04-03 ENCOUNTER — Other Ambulatory Visit (HOSPITAL_COMMUNITY): Payer: Self-pay

## 2021-04-03 ENCOUNTER — Other Ambulatory Visit: Payer: Self-pay

## 2021-04-03 ENCOUNTER — Ambulatory Visit (HOSPITAL_COMMUNITY)
Admission: RE | Admit: 2021-04-03 | Discharge: 2021-04-03 | Disposition: A | Discharge status: Home / Self Care | Payer: Self-pay | Source: Ambulatory Visit | Attending: Internal Medicine | Admitting: Internal Medicine

## 2021-04-03 VITALS — BP 102/70 | HR 77 | Wt 247.4 lb

## 2021-04-03 DIAGNOSIS — Z79899 Other long term (current) drug therapy: Secondary | ICD-10-CM | POA: Insufficient documentation

## 2021-04-03 DIAGNOSIS — Z7984 Long term (current) use of oral hypoglycemic drugs: Secondary | ICD-10-CM | POA: Insufficient documentation

## 2021-04-03 DIAGNOSIS — Z6837 Body mass index (BMI) 37.0-37.9, adult: Secondary | ICD-10-CM | POA: Insufficient documentation

## 2021-04-03 DIAGNOSIS — I5022 Chronic systolic (congestive) heart failure: Secondary | ICD-10-CM

## 2021-04-03 DIAGNOSIS — Z8249 Family history of ischemic heart disease and other diseases of the circulatory system: Secondary | ICD-10-CM | POA: Insufficient documentation

## 2021-04-03 DIAGNOSIS — I428 Other cardiomyopathies: Secondary | ICD-10-CM | POA: Insufficient documentation

## 2021-04-03 DIAGNOSIS — G4700 Insomnia, unspecified: Secondary | ICD-10-CM | POA: Insufficient documentation

## 2021-04-03 LAB — BASIC METABOLIC PANEL
Anion gap: 10 (ref 5–15)
BUN: 19 mg/dL (ref 6–20)
CO2: 28 mmol/L (ref 22–32)
Calcium: 9.3 mg/dL (ref 8.9–10.3)
Chloride: 100 mmol/L (ref 98–111)
Creatinine, Ser: 1.29 mg/dL — ABNORMAL HIGH (ref 0.61–1.24)
GFR, Estimated: >60 mL/min (ref >60)
Glucose, Bld: 149 mg/dL — ABNORMAL HIGH (ref 70–99)
Potassium: 4.5 mmol/L (ref 3.5–5.1)
Sodium: 138 mmol/L (ref 135–145)

## 2021-04-03 MED ORDER — FUROSEMIDE 40 MG PO TABS
ORAL_TABLET | ORAL | 5 refills | Status: DC
  Filled 2021-04-03: qty 45, 30d supply, fill #0
  Filled 2021-05-07: qty 45, 30d supply, fill #1
  Filled 2021-06-07: qty 45, 30d supply, fill #2
  Filled 2021-07-11: qty 45, 30d supply, fill #3
  Filled 2021-08-07: qty 45, 30d supply, fill #4
  Filled 2021-09-06: qty 45, 30d supply, fill #5

## 2021-04-03 MED ORDER — SPIRONOLACTONE 25 MG PO TABS
25.0000 mg | ORAL_TABLET | Freq: Every day | ORAL | 11 refills | Status: DC
  Filled 2021-04-03: qty 30, 30d supply, fill #0
  Filled 2021-05-02: qty 30, 30d supply, fill #1
  Filled 2021-06-04: qty 30, 30d supply, fill #2
  Filled 2021-06-29: qty 30, 30d supply, fill #3
  Filled 2021-08-02: qty 30, 30d supply, fill #4
  Filled 2021-08-30: qty 30, 30d supply, fill #5
  Filled 2021-09-26: qty 30, 30d supply, fill #6
  Filled 2021-11-01: qty 30, 30d supply, fill #7
  Filled 2021-12-19: qty 30, 30d supply, fill #8
  Filled 2022-01-14: qty 30, 30d supply, fill #9
  Filled 2022-04-01: qty 30, 30d supply, fill #10

## 2021-04-03 MED ORDER — POTASSIUM CHLORIDE CRYS ER 20 MEQ PO TBCR
20.0000 meq | EXTENDED_RELEASE_TABLET | Freq: Every day | ORAL | 5 refills | Status: DC
  Filled 2021-04-03 – 2021-05-02 (×2): qty 30, 30d supply, fill #0
  Filled 2021-06-04: qty 30, 30d supply, fill #1
  Filled 2021-07-03: qty 30, 30d supply, fill #2
  Filled 2021-08-02: qty 30, 30d supply, fill #3
  Filled 2021-08-30: qty 30, 30d supply, fill #4
  Filled 2021-09-26: qty 30, 30d supply, fill #5

## 2021-04-03 NOTE — Patient Instructions (Addendum)
It was a pleasure seeing you today!  MEDICATIONS: -We are changing your medications today -Restart spironolactone 25 mg (1 tablet) daily.  -Call if you have questions about your medications.  LABS: -We will call you if your labs need attention.  NEXT APPOINTMENT: Return to clinic in 4 weeks with Dr. Shirlee Latch.  In general, to take care of your heart failure: -Limit your fluid intake to 2 Liters (half-gallon) per day.   -Limit your salt intake to ideally 2-3 grams (2000-3000 mg) per day. -Weigh yourself daily and record, and bring that "weight diary" to your next appointment.  (Weight gain of 2-3 pounds in 1 day typically means fluid weight.) -The medications for your heart are to help your heart and help you live longer.   -Please contact us before stopping any of your heart medications.  Call the clinic at [phone] with questions or to reschedule future appointments.

## 2021-04-03 NOTE — Progress Notes (Signed)
PCP: Elsie Stain, MD EP: Dr. Quentin Ore HF Cardiologist: Dr. Aundra Dubin  HPI:  David Vang is a 30 y.o. WM with morbid obesity and family h/o cardiomyopathy, and new systolic HF.  Patient's father had a nonischemic cardiomyopathy and was implanted with a LVAD in his 35s.    Pt was diagnosed with CAP in 09/2020 and prescribed a course of abx + prednisone w/o improved symptoms. Subsequently, went to [place] on 10/22/20 w/ worsening symptoms + subjective fever.  In the ED, he met early sepsis criteria and CXR and CT concerning for bilateral PNA + CHF. No PE on CT. He was admitted and transferred to [facility] for further care. Placed on Vanc + cefepime. Also started on IV Lasix for CHF.  Echo showed severely reduced LVEF, 20-25% with global HK, no LVH, mild-mod MR, RV normal. Advanced Heart Failure consulted.   He underwent R/LHC on 10/25/20 with elevated right and left heart filling pressures, moderate pulmonary venous hypertension, low cardiac output but no CAD. cMRI with extensive airspace disease, right>left likely from recent pneumonia, LV severely dilated with EF 21%, nonspecific RV insertion site LGE.  Patient was started on diuretics and GDMT.  He was also started on amiodarone and fitted with LifeVest for his PVCs. He was discharged on GDMT, weight 227 lbs.    Echo 02/15/21, EF 25-30%, severe LV dilation, normal RV.     Recently returned to HF Clinic for follow up on 02/15/21. Weight was up 9 lbs.  He was wearing the [place] "fine" generally.  Went to [place] recently and walked 25 miles over several days.  No lightheadedness. He reported being able to climb a flight of stairs without dyspnea.  No orthopnea/PND.  He does not feel palpitations.   03/01/21 he returned to HF clinic for pharmacist medication titration. At 02/15/21 visit with MD, carvedilol was increased to 6.25 mg BID and amiodarone was decreased to 200 mg daily. He felt "a little off" for the first few days after  increasing carvedilol, but doing better now. Overall, he reported doing well. No dizziness, lightheadedness, or fatigue. No chest pain or palpitations. No SOB/dyspnea. Reported improvement with dyspnea climbing stairs and had been walking 2 miles around his complex every day.  Weight at home had been ~240 lbs. Weight had been slowly increasing over the last few months. Stated he felt this was related to diet and gaining back the weight he lost in the hospital and not related to fluid. Was taking furosemide 40 mg BID and had not had to take any additional doses. No LEE, PND or orthopnea. He was trying to limit salt to 1500 mg/day and had been "more mindful of sugar" intake as well.   Today he returns to HF clinic for pharmacist medication titration. At last visit with pharmacy clinic, Delene Loll was increased to 49/51 mg BID. He is feeling pretty good today, although he has had a cold for the past few days. No dizziness, lightheadedness, or fatigue. No chest pain or palpitations. No SOB with normal activity, some mild DOE when exercising. Takes furosemide 40 mg BID and has not needed any extra. Weight at home stable at 242-244 lbs. No LEE, PND or orthopnea. Home BP 120's/68-70's; BP in clinic 102/70. Appetite has been fine. Was recently on a trip to [place] and tried to adhere to low sodium diet as much as possible. Has been taking all medications, except spironolactone (since 01/2021), which he thought had been discontinued. Tolerating medications well.  HF Medications: Carvedilol 6.25 mg BID Entresto 49/51 mg BID Spironolactone 25 mg daily (has not been taking) Farxiga 10 mg daily Furosemide 40 BID + KCl 20 mEq BID   Has the patient been experiencing any side effects to the medications prescribed? No  Does the patient have any problems obtaining medications due to transportation or finances?   No insurance. Maryan Puls through [employer] PAP and [place] through [employer] P[employer]. Receives other HF medications  through [facility] at [facility] [facility].   Understanding of regimen: good Understanding of indications: good Potential of compliance: excellent Patient understands to avoid NSAIDs. Patient understands to avoid decongestants.    Pertinent Lab Values: 02/15/2021: Serum creatinine 1.05, BUN 18, Potassium 4.2, Sodium 138 BMET today pending  Vital Signs: Weight: 247.4 lbs (last clinic weight: 244.6 lbs) Blood pressure:  102/70 Heart rate:  77  Assessment/Plan: 1. 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 Echo in 01-Nov-2020 showed EF 20-25%, no LVH, mild-moderate MR, normal RV.  No ETOH or drugs.  RHC/LHC with no coronary disease, moderately elevated filling pressures and low cardiac output (2.2 thermo, 1.9 Fick).  Patient also noted to have very frequent PVCs.  Cardiac MRI showed severe LV dilation, EF 21%, nonspecific RV insertion site LGE.  Possible causes of CMP include PVC-mediated, viral myocarditis, or familial CMP.  Echo 02/15/21, EF still low at 25-30%.   -NYHA class I-II symptoms. Not volume overloaded on exam.  - BMET today pending.  - Continue furosemide 40 mg BID + 20 mEq of KCl BID.  - Continue carvedilol 6.25 mg BID.  - Continue Entresto to 49/51 mg BID. Receives through [employer] P[employer].  - Restart spironolactone 25 mg daily. Repeat BMET in 1 week.  - Continue Farxiga 10 mg daily.  - Off digoxin (previously stopped with elevated level). Will not restart with NYHA I-II symptoms. - Previously sent off [employer] gene testing given family history of cardiomyopathy.  - Suspected OSA, needs sleep study, but has no insurance. HFSW working on this. - Continue LifeVest.  Plan to repeat echo in 3 more months or so after maximal titration of GDMT. If EF still low, should get ICD (narrow QRS so no CRT).  - CPX  for objective assessment of functional capacity: moderate functional impairment when  compared to matched sedentary norms. There is a moderate HF limitation (at least) but some interpretation of HF limitation is masked with evidence for obesity hypoventilation syndrome contributing to intolerance. Low VE/VCO2 slope and high PETCO2 indicate likelihood of OSA. There was chronotropic incompetence. 2. PVCs: Frequent, often bigeminal.  Mexiletine tried without much effect. Given family history, suspect familial CMP but the PVCs can certainly make the LV function worse and will need suppression.  - Continue amiodarone 200 mg daily.    - Possible candidate for PVC ablation, but will need insurance. SW working on this.  Followup with Dr. Quentin Ore.  3. Insomnia: Discussed sleep hygiene. Try taking sertraline at night. OK to try low-dose OTC melatonin.  - Avoid daytime napping. Hopefully can get sleep study when he has insurance. 4. Obesity: Body mass index is 37.53 kg/m. - He is exercising more.   Follow up with Dr. Aundra Dubin on 05/03/2021  Parthenia Ames, PharmD PGY-1 [facility] Pharmacy Resident  Audry Riles, PharmD, BCPS, [facility], CPP Heart Failure Clinic Pharmacist [phone]

## 2021-04-10 ENCOUNTER — Other Ambulatory Visit: Payer: Self-pay

## 2021-04-10 ENCOUNTER — Other Ambulatory Visit (HOSPITAL_COMMUNITY): Payer: Self-pay

## 2021-04-10 ENCOUNTER — Ambulatory Visit (HOSPITAL_COMMUNITY)
Admission: RE | Admit: 2021-04-10 | Discharge: 2021-04-10 | Disposition: A | Discharge status: Home / Self Care | Payer: Self-pay | Source: Ambulatory Visit | Attending: Internal Medicine | Admitting: Internal Medicine

## 2021-04-10 DIAGNOSIS — I5022 Chronic systolic (congestive) heart failure: Secondary | ICD-10-CM | POA: Insufficient documentation

## 2021-04-10 LAB — BASIC METABOLIC PANEL
Anion gap: 9 (ref 5–15)
BUN: 15 mg/dL (ref 6–20)
CO2: 28 mmol/L (ref 22–32)
Calcium: 8.8 mg/dL — ABNORMAL LOW (ref 8.9–10.3)
Chloride: 98 mmol/L (ref 98–111)
Creatinine, Ser: 1.14 mg/dL (ref 0.61–1.24)
GFR, Estimated: >60 mL/min (ref >60)
Glucose, Bld: 150 mg/dL — ABNORMAL HIGH (ref 70–99)
Potassium: 4.2 mmol/L (ref 3.5–5.1)
Sodium: 135 mmol/L (ref 135–145)

## 2021-04-23 ENCOUNTER — Other Ambulatory Visit (HOSPITAL_COMMUNITY): Payer: Self-pay

## 2021-05-02 ENCOUNTER — Other Ambulatory Visit (HOSPITAL_COMMUNITY): Payer: Self-pay

## 2021-05-03 ENCOUNTER — Ambulatory Visit (HOSPITAL_COMMUNITY)
Admission: RE | Admit: 2021-05-03 | Discharge: 2021-05-03 | Disposition: A | Discharge status: Home / Self Care | Payer: Self-pay | Source: Ambulatory Visit | Attending: Cardiology | Admitting: Cardiology

## 2021-05-03 ENCOUNTER — Other Ambulatory Visit: Payer: Self-pay

## 2021-05-03 ENCOUNTER — Other Ambulatory Visit (HOSPITAL_COMMUNITY): Payer: Self-pay

## 2021-05-03 ENCOUNTER — Encounter (HOSPITAL_COMMUNITY): Payer: Self-pay | Admitting: Cardiology

## 2021-05-03 VITALS — BP 108/70 | HR 66 | Wt 252.0 lb

## 2021-05-03 DIAGNOSIS — I5022 Chronic systolic (congestive) heart failure: Secondary | ICD-10-CM | POA: Insufficient documentation

## 2021-05-03 DIAGNOSIS — I272 Pulmonary hypertension, unspecified: Secondary | ICD-10-CM | POA: Insufficient documentation

## 2021-05-03 DIAGNOSIS — F32A Depression, unspecified: Secondary | ICD-10-CM | POA: Insufficient documentation

## 2021-05-03 DIAGNOSIS — Z6839 Body mass index (BMI) 39.0-39.9, adult: Secondary | ICD-10-CM | POA: Insufficient documentation

## 2021-05-03 DIAGNOSIS — I493 Ventricular premature depolarization: Secondary | ICD-10-CM | POA: Insufficient documentation

## 2021-05-03 DIAGNOSIS — Z79899 Other long term (current) drug therapy: Secondary | ICD-10-CM | POA: Insufficient documentation

## 2021-05-03 DIAGNOSIS — R008 Other abnormalities of heart beat: Secondary | ICD-10-CM | POA: Insufficient documentation

## 2021-05-03 DIAGNOSIS — I428 Other cardiomyopathies: Secondary | ICD-10-CM | POA: Insufficient documentation

## 2021-05-03 DIAGNOSIS — Z7984 Long term (current) use of oral hypoglycemic drugs: Secondary | ICD-10-CM | POA: Insufficient documentation

## 2021-05-03 DIAGNOSIS — Z8249 Family history of ischemic heart disease and other diseases of the circulatory system: Secondary | ICD-10-CM | POA: Insufficient documentation

## 2021-05-03 LAB — BRAIN NATRIURETIC PEPTIDE: B Natriuretic Peptide: 10.2 pg/mL (ref 0.0–100.0)

## 2021-05-03 LAB — COMPREHENSIVE METABOLIC PANEL
ALT: 22 U/L (ref 0–44)
AST: 19 U/L (ref 15–41)
Albumin: 4 g/dL (ref 3.5–5.0)
Alkaline Phosphatase: 63 U/L (ref 38–126)
Anion gap: 8 (ref 5–15)
BUN: 18 mg/dL (ref 6–20)
CO2: 29 mmol/L (ref 22–32)
Calcium: 9.3 mg/dL (ref 8.9–10.3)
Chloride: 100 mmol/L (ref 98–111)
Creatinine, Ser: 1.1 mg/dL (ref 0.61–1.24)
GFR, Estimated: >60 mL/min (ref >60)
Glucose, Bld: 102 mg/dL — ABNORMAL HIGH (ref 70–99)
Potassium: 4.1 mmol/L (ref 3.5–5.1)
Sodium: 137 mmol/L (ref 135–145)
Total Bilirubin: 0.7 mg/dL (ref 0.3–1.2)
Total Protein: 7 g/dL (ref 6.5–8.1)

## 2021-05-03 LAB — TSH: TSH: 3.614 u[IU]/mL (ref 0.350–4.500)

## 2021-05-03 MED ORDER — CARVEDILOL 12.5 MG PO TABS
12.5000 mg | ORAL_TABLET | Freq: Two times a day (BID) | ORAL | 3 refills | Status: DC
  Filled 2021-05-03: qty 60, 30d supply, fill #0
  Filled 2021-06-12: qty 60, 30d supply, fill #1
  Filled 2021-07-12: qty 60, 30d supply, fill #2

## 2021-05-03 MED ORDER — SERTRALINE HCL 25 MG PO TABS
25.0000 mg | ORAL_TABLET | Freq: Every day | ORAL | 0 refills | Status: DC
  Filled 2021-05-03: qty 30, 30d supply, fill #0

## 2021-05-03 NOTE — Progress Notes (Signed)
Advanced Heart Failure Clinic Note    PCP: Elsie Stain, MD EP: Dr. Quentin Ore HF Cardiologist: Dr. Aundra Dubin  HPI:  David Vang is a 30 y.o. WM with morbid obesity and family h/o cardiomyopathy, and new systolic HF.  Patient's father had a nonischemic cardiomyopathy and was implanted with a LVAD in his 15s.    Pt was diagnosed w/ CAP in 5/22 and prescribed a course of abx + prednisone w/o improved symptoms. Subsequently, went to [facility] on 10/22/20 w/ worsening symptoms + subjective fever.  In the ED, he met early sepsis criteria and CXR and CT concerning for bilateral PNA + CHF. No PE on CT. He was admitted and transferred to [facility] for further care. Placed on Vanc + cefepime. Also started on IV Lasix for CHF.  Echo showed severely reduced LVEF, 20-25% w/ global HK, no LVH, mild-mod MR, RV normal. Advanced Heart Failure consulted.   He underwent R/LHC on 10/25/20 with elevated right and left heart filling pressures, moderate pulmonary venous hypertension, low cardiac output but no CAD. cMRI with extensive airspace disease, right>left likely from recent pneumonia, LV severely dilated with EF 21%, nonspecific RV insertion site LGE.  Patient was started on diuretics and GDMT.  He was also started on amiodarone and fitted with LifeVest for his PVCs. He was discharged on GDMT, weight 227 lbs.   Echo in 9/22 showed EF 25-30%, severe LV dilation, normal RV.   CPX in 10/22 showed moderate HF limitation.   Today he returns for HF follow up. Weight is up 13 lbs.  He is eating more and not exercising much.  However, symptomatically, he is doing well.  He is not back to work yet.  He is short of breath carrying a heavy load up stairs, but no dyspnea just walking up a flight of stairs.  No dyspnea walking on flat ground.  No orthopnea/PND.  No lightheadedness.  No chest pain.  Still with depressed mood, has run out of sertraline.  He says that sertraline did help. He is still wearing the [place] (8/22): K 3.5, creatinine 1.1 Labs (11/22): K 4.2, creatinine 1.14  PMH: 1. Chronic systolic CHF: Echo (9/51) with EF 20-25%, diffuse hypokinesis, mild-moderate MR, normal RV.  - LHC/RHC (6/22): No CAD; mean RA 9, PA 59/25, mean PCWP 24, CI 1.9 (Fick), CI 2.2 (thermo). PVR 2.7 WU.  - Cardiac MRI (6/22): Severe LV dilation, EF 21%, RV EF 35%, nonspecific RV insertion site LGE.  - Echo (9/22): EF 25-30%, severe LV dilation, global hypokinesis, normal RV.  - CPX (1022): peak VO2 18.9, VE/VCO2 slope 21, RER 1.18 => moderate HF limitation.  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   Social History   Socioeconomic History   Marital status: Married    Spouse name: Not on file   Number of children: Not on file   Years of education: Not on file   Highest education level: Not on file  Occupational History   Not on file  Tobacco Use   Smoking status: Never   Smokeless tobacco: Never  Substance and Sexual Activity   Alcohol use: Not Currently   Drug use: Never   Sexual activity: Not on file  Other Topics Concern   Not on file  Social History Narrative   Not on file   Social Determinants of Health   Financial Resource Strain: Medium Risk   Difficulty of Paying Living Expenses: Somewhat hard  Food Insecurity: No Food Insecurity  Worried About Charity fundraiser in the Last Year: Never true   [place] in the Last Year: Never true  Transportation Needs: No Transportation Needs   Lack of Transportation (Medical): No   Lack of Transportation (Non-Medical): No  Physical Activity: Not on file  Stress: Not on file  Social Connections: Not on file  Intimate Partner Violence: Not on file   Family History  Problem Relation Age of Onset   Heart failure Father        dx'ed with cardiomyoopathy in his late 51's/40's, and ultimately required LVAD    ROS: All systems reviewed and negative except as per HPI.     Current Outpatient Medications  Medication Sig Dispense Refill    amiodarone (PACERONE) 200 MG tablet Take 1 tablet (200 mg total) by mouth daily. 30 tablet 2   dapagliflozin propanediol (FARXIGA) 10 MG TABS tablet Take 1 tablet (10 mg total) by mouth daily. 30 tablet 3   furosemide (LASIX) 40 MG tablet Take 1 tablet (40 mg total) by mouth every morning AND 0.5 tablets (20 mg total) every evening. 45 tablet 5   potassium chloride SA (KLOR-CON M) 20 MEQ tablet Take 1 tablet (20 mEq total) by mouth daily. 30 tablet 5   sacubitril-valsartan (ENTRESTO) 49-51 MG Take 1 tablet by mouth 2 (two) times daily. 180 tablet 3   sertraline (ZOLOFT) 25 MG tablet Take 1 tablet (25 mg total) by mouth daily. 30 tablet 0   spironolactone (ALDACTONE) 25 MG tablet Take 1 tablet (25 mg total) by mouth daily. 30 tablet 11   carvedilol (COREG) 12.5 MG tablet Take 1 tablet (12.5 mg total) by mouth 2 (two) times daily with a meal. 180 tablet 3   No current facility-administered medications for this encounter.   Allergies  Allergen Reactions   Benadryl [Diphenhydramine] Other (See Comments)    Pass out    BP 108/70   Pulse 66   Wt 114.3 kg (252 lb)   SpO2 99%   BMI 39.47 kg/m   Wt Readings from Last 3 Encounters:  05/03/21 114.3 kg (252 lb)  04/03/21 112.2 kg (247 lb 6.4 oz)  03/01/21 110.9 kg (244 lb 9.6 oz)   PHYSICAL EXAM: General: NAD, obese.  Neck: No JVD, no thyromegaly or thyroid nodule.  Lungs: Clear to auscultation bilaterally with normal respiratory effort. CV: Nondisplaced PMI.  Heart regular S1/S2, no S3/S4, no murmur.  No peripheral edema.  No carotid bruit.  Normal pedal pulses.  Abdomen: Soft, nontender, no hepatosplenomegaly, no distention.  Skin: Intact without lesions or rashes.  Neurologic: Alert and oriented x 3.  Psych: Normal affect. Extremities: No clubbing or cyanosis.  HEENT: Normal.   ASSESSMENT & PLAN: 1. 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 Echo in 25-Oct-2022 showed EF 20-25%, no LVH, mild-moderate MR, normal RV.  No ETOH or drugs.  RHC/LHC with no coronary disease, moderately elevated filling pressures and low cardiac output (2.2 thermo, 1.9 Fick).  Patient also noted to have very frequent PVCs.  Cardiac MRI showed severe LV dilation, EF 21%, nonspecific RV insertion site LGE.  Possible causes of CMP include PVC-mediated, viral myocarditis, or familial CMP.  Echo in 9/22 showed EF still low at 25-30%.  CPX in 10/22 showed moderate HF limitation.  NYHA class I-II symptoms.  He is not volume overloaded on exam.  - Continue  lasix 40 qam/20 qpm.  - Continue Entresto 49/51 mg bid.  - Increase Coreg to 12.5 mg bid.  - Continue spironolactone 25 mg daily. - Continue Farxiga 10 mg daily.  - Off digoxin (previously stopped with elevated level). Will not restart with NYHA I-II symptoms. - With lack of insurance, unable to get gene testing (for possible familial CMP).  Would eventually refer to [facility] for genetics evaluation.  - Suspect OSA, needs sleep study, but has no insurance. HFSW working on this. - Continue LifeVest.  I will repeat echo at followup in 2 months after maximal titration of GDMT.  If EF still low, should get ICD (narrow QRS so no CRT). Will need to get insurance in order to get ICD.  2. PVCs: Frequent, often bigeminal.  Mexiletine tried without much effect, now on amiodarone. 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  - Continue amiodarone 200 mg daily.  Check LFTs and TSH.  He will need a regular eye exam.  - Possible candidate for PVC ablation, but will need insurance. SW working on this.  Followup with Dr. Quentin Ore.  3. Depression: Restart sertraline 25 mg daily.  4. Obesity: Body mass index is 39.47 kg/m. - I will refer to pharmacy clinic to see if he can get semaglutide.   I think he can go back to work as  long as he lifts < 50 lbs.  Hopefully, he can get health insurance.   Follow up with HF pharmacist in 3 weeks for medication titration x 2 visits, then see me in 2 months. Followup with HF pharmacist in 3 wks for med titration, see me in 2 months with echo.   Loralie Champagne 05/03/2021

## 2021-05-03 NOTE — Patient Instructions (Signed)
Labs done today. We will contact you only if your labs are abnormal.  INCREASE Carvedilol to 12.5mg  (1 tablet) by mouth 2 times daily.  START Sertraline 25mg  (1 tablet) by mouth daily.   No other medication changes were made. Please continue all current medications as prescribed.  You have been referred [facility]. They will contact you to schedule an appointment.   Your physician recommends that you schedule a follow-up appointment in: 3 weeks with our Clinic Pharmacist and in 2 monthd fot an appointment with an echo prior to your exam.  Your physician has requested that you have an echocardiogram. Echocardiography is a painless test that uses sound waves to create images of your heart. It provides your doctor with information about the size and shape of your heart and how well your heart's chambers and valves are working. This procedure takes approximately one hour. There are no restrictions for this procedure.  If you have any questions or concerns before your next appointment please send [name] a message through [place] or call our office at [phone].    TO LEAVE A MESSAGE FOR THE NURSE SELECT OPTION 2, PLEASE LEAVE A MESSAGE INCLUDING: YOUR NAME DATE OF BIRTH CALL BACK NUMBER REASON FOR CALL**this is important as we prioritize the call backs  YOU WILL RECEIVE A CALL BACK THE SAME DAY AS LONG AS YOU CALL BEFORE 4:00 PM   Do the following things EVERYDAY: Weigh yourself in the morning before breakfast. Write it down and keep it in a log. Take your medicines as prescribed Eat low salt foods--Limit salt (sodium) to 2000 mg per day.  Stay as active as you can everyday Limit all fluids for the day to less than 2 liters   At the Advanced Heart Failure Clinic, you and your health needs are our priority. As part of our continuing mission to provide you with exceptional heart care, we have created designated Provider Care Teams. These Care Teams include your  primary Cardiologist (physician) and Advanced Practice Providers (APPs- Physician Assistants and Nurse Practitioners) who all work together to provide you with the care you need, when you need it.   You may see any of the following providers on your designated Care Team at your next follow up: Dr [phone] Dr Arvilla Meres, NP Carron Curie, Robbie Lis [place], PharmD   Please be sure to bring in all your medications bottles to every appointment.

## 2021-05-08 ENCOUNTER — Telehealth: Payer: Self-pay | Admitting: Pharmacist

## 2021-05-08 ENCOUNTER — Other Ambulatory Visit (HOSPITAL_COMMUNITY): Payer: Self-pay

## 2021-05-08 NOTE — Telephone Encounter (Signed)
Received referral from Dr Shirlee Latch to initiate semaglutide for weight loss. Pt does not have insurance and there are no pt assistance programs for [place] or [place]. He does not have DM so cannot pursue pt assistance for Ozempic.  Left message for pt to let us know if he does get insurance in the future and can look into coverage.

## 2021-05-15 NOTE — Progress Notes (Signed)
PCP: Elsie Stain, MD EP: Dr. Quentin Ore HF Cardiologist: Dr. Aundra Dubin  HPI:  David Vang is a 30 y.o. WM with morbid obesity and family h/o cardiomyopathy, and new systolic HF.  Patient's father had a nonischemic cardiomyopathy and was implanted with a LVAD in his 52s.    Pt was diagnosed with CAP in 09/2020 and prescribed a course of abx + prednisone w/o improved symptoms. Subsequently, went to [facility] on 10/22/20 w/ worsening symptoms + subjective fever.  In the ED, he met early sepsis criteria and CXR and CT concerning for bilateral PNA + CHF. No PE on CT. He was admitted and transferred to [facility] for further care. Placed on Vanc + cefepime. Also started on IV Lasix for CHF.  Echo showed severely reduced LVEF, 20-25% with global HK, no LVH, mild-mod MR, RV normal. Advanced Heart Failure consulted.   He underwent R/LHC on 10/25/20 with elevated right and left heart filling pressures, moderate pulmonary venous hypertension, low cardiac output but no CAD. cMRI with extensive airspace disease, right>left likely from recent pneumonia, LV severely dilated with EF 21%, nonspecific RV insertion site LGE.  Patient was started on diuretics and GDMT.  He was also started on amiodarone and fitted with LifeVest for his PVCs. He was discharged on GDMT, weight 227 lbs.    Echo 02/15/21, EF 25-30%, severe LV dilation, normal RV.     Recently returned to HF Clinic for follow up on 05/03/21. Weight was up 13 lbs.  He had been eating more and not exercising much.  However, symptomatically, he was doing well.  He was not back to work yet.  He reported getting short of breath carrying a heavy load up stairs, but no dyspnea just walking up a flight of stairs.  No dyspnea walking on flat ground.  No orthopnea/PND.  No lightheadedness.  No chest pain.  Still with depressed mood, had run out of sertraline.  He stated that the sertraline did help. He was still wearing the [place].  Today he returns to HF clinic  for pharmacist medication titration. At last visit with MD, carvedilol was increased to 12.5 mg BID. His amiodarone was discontinued by Dr. Quentin Ore on 05/23/21. Overall he is feeling well today. Has been cleared to go back to work, which he is excited about. Took a trip to [place]. over Christmas break and felt fine walking around the city. Occasional dizziness when he stands too quickly. No SOB/DOE. Restarted going back to the gym this week. Weight at home has been stable. Takes Lasix 40 mg QAM/20 mg QPM and has not needed any extra. No LEE, PND or orthopnea. Taking all medications as prescribed and tolerating all medications.     HF Medications: Carvedilol 12.5 mg BID Entresto 49/51 mg BID Spironolactone 25 mg daily  Farxiga 10 mg daily Lasix 40 mg QAM/20 mg QPM   Has the patient been experiencing any side effects to the medications prescribed? No  Does the patient have any problems obtaining medications due to transportation or finances?   No insurance. Maryan Puls through [employer] P[employer] and [place] through [employer] P[employer]. Receives other HF medications through [facility] at [employer].   Understanding of regimen: good Understanding of indications: good Potential of compliance: excellent Patient understands to avoid NSAIDs. Patient understands to avoid decongestants.    Pertinent Lab Values: 05/03/21: Serum creatinine 1.10, BUN 18, Potassium 4.1, Sodium 137   Vital Signs:  Weight: 250.8 lbs (last clinic weight: 252 lbs) Blood pressure:  118/84 Heart rate:  65  Assessment/Plan: 1. 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 Echo in 10-29-2020 showed EF 20-25%, no LVH, mild-moderate MR, normal RV.  No ETOH or drugs.  RHC/LHC with no coronary disease, moderately elevated filling pressures and low cardiac output (2.2 thermo, 1.9 Fick).  Patient also noted to have very  frequent PVCs.  Cardiac MRI showed severe LV dilation, EF 21%, nonspecific RV insertion site LGE.  Possible causes of CMP include PVC-mediated, viral myocarditis, or familial CMP.  Echo 02/15/21 showed EF still low at 25-30%. CPX in 02/2021 showed moderate HF limitation.   -NYHA class I-II symptoms. Not volume overloaded on exam.  - Continue Lasix 40 qam/20 qpm.  - Continue carvedilol 12.5 mg BID.  - Increase Entresto to 97/103 mg BID. Repeat BMET in 3 weeks. Receives through [employer].  - Continue spironolactone 25 mg daily.  - Continue Farxiga 10 mg daily.  - Off digoxin (previously stopped with elevated level). Will not restart with NYHA I-II symptoms. - With lack of insurance, unable to get gene testing (for possible familial CMP).  Would eventually refer to [facility] for genetics evaluation.  - Suspect OSA, needs sleep study, but has no insurance. HFSW working on this. - Continue LifeVest. Repeat echo scheduled for 07/06/21. If EF still low, should get ICD (narrow QRS so no CRT). Will need to get insurance in order to get ICD.  2. PVCs: Frequent, often bigeminal.  Mexiletine tried without much effect, was transitioned to amiodarone. 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 - Amiodarone discontinued 05/23/21 by Dr. Quentin Ore as PVC's suppressed. If there is a recurrence of PVCs off of amiodarone, would consider and alternative antiarrhythmic drug versus ablation per Dr. Quentin Ore. He will follow up with them in 4 months. . - Possible candidate for PVC ablation, but will need insurance. SW working on this.   3. Depression: Continue sertraline 25 mg daily.  4. Obesity: Body mass index is 39.47 kg/m. - Referred to pharmacy clinic for semaglutide but unable to obtain as he does not have DM so cannot pursue patient assistance for Ozempic.  Follow up 1 month with Dr. Domingo Dimes.    Audry Riles, PharmD, BCPS, BCCP, CPP Heart Failure  Clinic Pharmacist [phone]

## 2021-05-22 NOTE — Progress Notes (Signed)
Electrophysiology Office Follow up Visit Note:    Date:  05/23/2021   ID:  David Vang, DOB 1990-09-15, MRN [id number]  PCP:  Storm Frisk, MD  [facility] Cardiologist:  None  [facility] Electrophysiologist:  Lanier Prude, MD    Interval History:    David Vang is a 30 y.o. male who presents for a follow up visit. They were last seen in clinic 12/08/2020 for PVCs. He is maintained on amiodarone 200mg  PO BID. At our last appointment, we planned to meet back today and discuss discontinuing amiodarone. Ablation has been discussed in the past but deferred given clinical success with amiodarone.  We also discussed ICD at our last appointment.   Since I saw him he has been doing well.  He had a vacation recently to [place] and was able to walk a lot without significantly limiting symptoms.  No fluid accumulation.  No syncope or presyncope.  He is very enthusiastic about stopping amiodarone.     Past Medical History:  Diagnosis Date   Morbid obesity [facility])     Past Surgical History:  Procedure Laterality Date   RIGHT/LEFT HEART CATH AND CORONARY ANGIOGRAPHY N/A 10/25/2020   Procedure: RIGHT/LEFT HEART CATH AND CORONARY ANGIOGRAPHY;  Surgeon: 12/25/2020, MD;  Location: [facility] INVASIVE CV LAB;  Service: Cardiovascular;  Laterality: N/A;    Current Medications: Current Meds  Medication Sig   amiodarone (PACERONE) 200 MG tablet Take 1 tablet (200 mg total) by mouth daily.   carvedilol (COREG) 12.5 MG tablet Take 1 tablet (12.5 mg total) by mouth 2 (two) times daily with a meal.   dapagliflozin propanediol (FARXIGA) 10 MG TABS tablet Take 1 tablet (10 mg total) by mouth daily.   furosemide (LASIX) 40 MG tablet Take 1 tablet (40 mg total) by mouth every morning AND 0.5 tablets (20 mg total) every evening.   potassium chloride SA (KLOR-CON M) 20 MEQ tablet Take 1 tablet (20 mEq total) by mouth daily.   sacubitril-valsartan (ENTRESTO) 49-51 MG Take 1 tablet by  mouth 2 (two) times daily.   sertraline (ZOLOFT) 25 MG tablet Take 1 tablet (25 mg total) by mouth daily.   spironolactone (ALDACTONE) 25 MG tablet Take 1 tablet (25 mg total) by mouth daily.     Allergies:   Benadryl [diphenhydramine]   Social History   Socioeconomic History   Marital status: Married    Spouse name: Not on file   Number of children: Not on file   Years of education: Not on file   Highest education level: Not on file  Occupational History   Not on file  Tobacco Use   Smoking status: Never   Smokeless tobacco: Never  Substance and Sexual Activity   Alcohol use: Not Currently   Drug use: Never   Sexual activity: Not on file  Other Topics Concern   Not on file  Social History Narrative   Not on file   Social Determinants of Health   Financial Resource Strain: Medium Risk   Difficulty of Paying Living Expenses: Somewhat hard  Food Insecurity: No Food Insecurity   Worried About Running Out of Food in the Last Year: Never true   Ran Out of Food in the Last Year: Never true  Transportation Needs: No Transportation Needs   Lack of Transportation (Medical): No   Lack of Transportation (Non-Medical): No  Physical Activity: Not on file  Stress: Not on file  Social Connections: Not on file  Family History: The patient's family history includes Heart failure in his father.  ROS:   Please see the history of present illness.    All other systems reviewed and are negative.  EKGs/Labs/Other Studies Reviewed:    The following studies were reviewed today:  02/15/2021 Echo EF 25-30% Global hypokinesis Dilated LV RV normal   EKG:  The ekg ordered today demonstrates sinus rhythm.  No PVCs.  Recent Labs: 10/27/2020: Magnesium 2.2 12/06/2020: Hemoglobin 15.2; Platelets 321 05/03/2021: ALT 22; B Natriuretic Peptide 10.2; BUN 18; Creatinine, Ser 1.10; Potassium 4.1; Sodium 137; TSH 3.614  Recent Lipid Panel    Component Value Date/Time   CHOL 159 10/25/2020  0434   TRIG 68 10/25/2020 0434   HDL 30 (L) 10/25/2020 0434   CHOLHDL 5.3 10/25/2020 0434   VLDL 14 10/25/2020 0434   LDLCALC 115 (H) 10/25/2020 0434    Physical Exam:    VS:  BP 114/72    Pulse 62    Ht 5\' 7"  (1.702 m)    Wt 250 lb (113.4 kg)    SpO2 98%    BMI 39.16 kg/m     Wt Readings from Last 3 Encounters:  05/23/21 250 lb (113.4 kg)  05/03/21 252 lb (114.3 kg)  04/03/21 247 lb 6.4 oz (112.2 kg)     GEN:  Well nourished, well developed in no acute distress.  Obese HEENT: Normal NECK: No JVD; No carotid bruits LYMPHATICS: No lymphadenopathy CARDIAC: RRR, no murmurs, rubs, gallops RESPIRATORY:  Clear to auscultation without rales, wheezing or rhonchi  ABDOMEN: Soft, non-tender, non-distended MUSCULOSKELETAL:  No edema; No deformity  SKIN: Warm and dry NEUROLOGIC:  Alert and oriented x 3 PSYCHIATRIC:  Normal affect        ASSESSMENT:    1. Chronic systolic heart failure ([place])   2. PVC's (premature ventricular contractions)    PLAN:    In order of problems listed above:  #Chronic systolic heart failure NYHA class II symptoms today.  Warm and dry on exam.  On good medical therapy.  Follows with the heart failure clinic. Heart failure clinic is planning to repeat an echocardiogram in about 2 months to reassess his left ventricular function while on maximal medical therapy.  If EF is persistently reduced, will need ICD implant. Patient working on obtaining health insurance through a job.  This will be necessary to implant an ICD.  #PVCs No PVCs on today's EKG.  On amiodarone.  At this point, I would favor stopping the amiodarone and monitoring his rhythm.  If there is a recurrence of PVCs off of amiodarone, could consider alternative antiarrhythmic drug versus ablation.  We will plan to see him back in about 4 months or sooner as needed.  If the EF is persistently reduced on the echocardiogram in 2 months, we will need to discuss ICD implant.  Follow-up 4 months.   APP okay.   Medication Adjustments/Labs and Tests Ordered: Current medicines are reviewed at length with the patient today.  Concerns regarding medicines are outlined above.  Orders Placed This Encounter  Procedures   EKG 12-Lead   No orders of the defined types were placed in this encounter.    Signed, Lars Mage, MD, [facility], [facility] 05/23/2021 8:24 AM    Electrophysiology [facility]

## 2021-05-23 ENCOUNTER — Other Ambulatory Visit: Payer: Self-pay

## 2021-05-23 ENCOUNTER — Encounter: Payer: Self-pay | Admitting: Cardiology

## 2021-05-23 ENCOUNTER — Ambulatory Visit (INDEPENDENT_AMBULATORY_CARE_PROVIDER_SITE_OTHER): Payer: Self-pay | Admitting: Cardiology

## 2021-05-23 VITALS — BP 114/72 | HR 62 | Ht 67.0 in | Wt 250.0 lb

## 2021-05-23 DIAGNOSIS — I5022 Chronic systolic (congestive) heart failure: Secondary | ICD-10-CM

## 2021-05-23 DIAGNOSIS — I493 Ventricular premature depolarization: Secondary | ICD-10-CM

## 2021-05-23 NOTE — Patient Instructions (Addendum)
Medication Instructions:  Your physician recommends that you continue on your current medications as directed. Please refer to the Current Medication list given to you today. *If you need a refill on your cardiac medications before your next appointment, please call your pharmacy*  Lab Work: None ordered. If you have labs (blood work) drawn today and your tests are completely normal, you will receive your results only by: MyChart Message (if you have MyChart) OR A paper copy in the mail If you have any lab test that is abnormal or we need to change your treatment, we will call you to review the results.  Testing/Procedures: None ordered.  Follow-Up: At [facility], you and your health needs are our priority.  As part of our continuing mission to provide you with exceptional heart care, we have created designated Provider Care Teams.  These Care Teams include your primary Cardiologist (physician) and Advanced Practice Providers (APPs -  Physician Assistants and Nurse Practitioners) who all work together to provide you with the care you need, when you need it.  Your next appointment:   Your physician wants you to follow-up in: 4 months with one of the following Advanced Practice Providers on your designated Care Team:   Francis Dowse, PA-C Casimiro Needle "[name]" [place], [place] You will receive a reminder letter in the mail two months in advance. If you don't receive a letter, please call our office to schedule the follow-up appointment.

## 2021-05-31 ENCOUNTER — Other Ambulatory Visit (HOSPITAL_COMMUNITY): Payer: Self-pay

## 2021-05-31 ENCOUNTER — Other Ambulatory Visit: Payer: Self-pay

## 2021-05-31 ENCOUNTER — Ambulatory Visit (HOSPITAL_COMMUNITY)
Admission: RE | Admit: 2021-05-31 | Discharge: 2021-05-31 | Disposition: A | Discharge status: Home / Self Care | Payer: Self-pay | Source: Ambulatory Visit | Attending: Cardiology | Admitting: Cardiology

## 2021-05-31 VITALS — BP 118/84 | HR 65 | Wt 250.8 lb

## 2021-05-31 DIAGNOSIS — R0602 Shortness of breath: Secondary | ICD-10-CM | POA: Insufficient documentation

## 2021-05-31 DIAGNOSIS — F32A Depression, unspecified: Secondary | ICD-10-CM | POA: Insufficient documentation

## 2021-05-31 DIAGNOSIS — I428 Other cardiomyopathies: Secondary | ICD-10-CM | POA: Insufficient documentation

## 2021-05-31 DIAGNOSIS — E669 Obesity, unspecified: Secondary | ICD-10-CM | POA: Insufficient documentation

## 2021-05-31 DIAGNOSIS — Z6839 Body mass index (BMI) 39.0-39.9, adult: Secondary | ICD-10-CM | POA: Insufficient documentation

## 2021-05-31 DIAGNOSIS — I5022 Chronic systolic (congestive) heart failure: Secondary | ICD-10-CM | POA: Insufficient documentation

## 2021-05-31 MED ORDER — SERTRALINE HCL 25 MG PO TABS
25.0000 mg | ORAL_TABLET | Freq: Every day | ORAL | 11 refills | Status: DC
  Filled 2021-05-31 – 2021-06-04 (×2): qty 30, 30d supply, fill #0
  Filled 2021-06-29: qty 30, 30d supply, fill #1
  Filled 2021-08-02: qty 30, 30d supply, fill #2
  Filled 2021-08-30: qty 30, 30d supply, fill #3
  Filled 2021-09-26: qty 30, 30d supply, fill #4
  Filled 2021-11-01: qty 30, 30d supply, fill #5
  Filled 2021-12-19: qty 30, 30d supply, fill #6
  Filled 2022-01-14: qty 30, 30d supply, fill #7
  Filled 2022-04-01: qty 30, 30d supply, fill #8
  Filled 2022-05-17 (×2): qty 30, 30d supply, fill #9

## 2021-05-31 MED ORDER — SACUBITRIL-VALSARTAN 97-103 MG PO TABS: 1.0000 | ORAL_TABLET | Freq: Two times a day (BID) | ORAL | 3 refills | Status: DC

## 2021-05-31 NOTE — Patient Instructions (Signed)
It was a pleasure seeing you today!  MEDICATIONS: -We are changing your medications today --Increase Entresto to 97/103 mg (1 tablet) twice daily. You may take 2 tablets of the 49/51 mg strength twice daily until you receive the new strength.  -Call if you have questions about your medications.  NEXT APPOINTMENT: Return to clinic in 1 month with Dr. Shirlee Latch.  In general, to take care of your heart failure: -Limit your fluid intake to 2 Liters (half-gallon) per day.   -Limit your salt intake to ideally 2-3 grams (2000-3000 mg) per day. -Weigh yourself daily and record, and bring that "weight diary" to your next appointment.  (Weight gain of 2-3 pounds in 1 day typically means fluid weight.) -The medications for your heart are to help your heart and help you live longer.   -Please contact us before stopping any of your heart medications.  Call the clinic at [phone] with questions or to reschedule future appointments.

## 2021-06-04 ENCOUNTER — Other Ambulatory Visit (HOSPITAL_COMMUNITY): Payer: Self-pay

## 2021-06-07 ENCOUNTER — Other Ambulatory Visit (HOSPITAL_COMMUNITY): Payer: Self-pay

## 2021-06-12 ENCOUNTER — Telehealth (HOSPITAL_COMMUNITY): Payer: Self-pay | Admitting: Pharmacist

## 2021-06-12 NOTE — Telephone Encounter (Signed)
Provided patient with Entresto samples.  Medication: Entresto 49/51 mg Quantity: 2 bottles  Lot: [id number] Expiration date: 06/2022   Karle Plumber, PharmD, BCPS, CPP Heart Failure Clinic Pharmacist [phone]

## 2021-06-13 ENCOUNTER — Other Ambulatory Visit (HOSPITAL_COMMUNITY): Payer: Self-pay

## 2021-06-29 ENCOUNTER — Other Ambulatory Visit (HOSPITAL_COMMUNITY): Payer: Self-pay

## 2021-07-03 ENCOUNTER — Other Ambulatory Visit (HOSPITAL_COMMUNITY): Payer: Self-pay

## 2021-07-06 ENCOUNTER — Other Ambulatory Visit (HOSPITAL_COMMUNITY): Payer: Self-pay

## 2021-07-06 ENCOUNTER — Encounter (HOSPITAL_COMMUNITY): Payer: Self-pay | Admitting: Cardiology

## 2021-07-11 ENCOUNTER — Other Ambulatory Visit (HOSPITAL_COMMUNITY): Payer: Self-pay

## 2021-07-12 ENCOUNTER — Other Ambulatory Visit (HOSPITAL_COMMUNITY): Payer: Self-pay

## 2021-07-27 ENCOUNTER — Telehealth (HOSPITAL_COMMUNITY): Payer: Self-pay | Admitting: *Deleted

## 2021-07-27 NOTE — Telephone Encounter (Signed)
Entresto samples left at the front desk as requested  ?

## 2021-08-03 ENCOUNTER — Other Ambulatory Visit (HOSPITAL_COMMUNITY): Payer: Self-pay

## 2021-08-07 ENCOUNTER — Other Ambulatory Visit (HOSPITAL_COMMUNITY): Payer: Self-pay

## 2021-08-07 ENCOUNTER — Encounter (HOSPITAL_COMMUNITY): Payer: Self-pay | Admitting: Cardiology

## 2021-08-07 ENCOUNTER — Ambulatory Visit (HOSPITAL_BASED_OUTPATIENT_CLINIC_OR_DEPARTMENT_OTHER)
Admission: RE | Admit: 2021-08-07 | Discharge: 2021-08-07 | Disposition: A | Discharge status: Home / Self Care | Payer: Self-pay | Source: Ambulatory Visit | Attending: Cardiology | Admitting: Cardiology

## 2021-08-07 ENCOUNTER — Ambulatory Visit (HOSPITAL_COMMUNITY)
Admission: RE | Admit: 2021-08-07 | Discharge: 2021-08-07 | Disposition: A | Discharge status: Home / Self Care | Payer: Self-pay | Source: Ambulatory Visit | Attending: Cardiology | Admitting: Cardiology

## 2021-08-07 ENCOUNTER — Other Ambulatory Visit: Payer: Self-pay

## 2021-08-07 VITALS — BP 118/78 | HR 57 | Wt 252.4 lb

## 2021-08-07 DIAGNOSIS — I5022 Chronic systolic (congestive) heart failure: Secondary | ICD-10-CM

## 2021-08-07 DIAGNOSIS — Z8249 Family history of ischemic heart disease and other diseases of the circulatory system: Secondary | ICD-10-CM | POA: Insufficient documentation

## 2021-08-07 DIAGNOSIS — R9431 Abnormal electrocardiogram [ECG] [EKG]: Secondary | ICD-10-CM | POA: Insufficient documentation

## 2021-08-07 DIAGNOSIS — Z7984 Long term (current) use of oral hypoglycemic drugs: Secondary | ICD-10-CM | POA: Insufficient documentation

## 2021-08-07 DIAGNOSIS — I272 Pulmonary hypertension, unspecified: Secondary | ICD-10-CM | POA: Insufficient documentation

## 2021-08-07 DIAGNOSIS — I428 Other cardiomyopathies: Secondary | ICD-10-CM | POA: Insufficient documentation

## 2021-08-07 DIAGNOSIS — R008 Other abnormalities of heart beat: Secondary | ICD-10-CM | POA: Insufficient documentation

## 2021-08-07 DIAGNOSIS — Z79899 Other long term (current) drug therapy: Secondary | ICD-10-CM | POA: Insufficient documentation

## 2021-08-07 DIAGNOSIS — Z6839 Body mass index (BMI) 39.0-39.9, adult: Secondary | ICD-10-CM | POA: Insufficient documentation

## 2021-08-07 DIAGNOSIS — I493 Ventricular premature depolarization: Secondary | ICD-10-CM | POA: Insufficient documentation

## 2021-08-07 DIAGNOSIS — F32A Depression, unspecified: Secondary | ICD-10-CM | POA: Insufficient documentation

## 2021-08-07 LAB — BASIC METABOLIC PANEL
Anion gap: 4 — ABNORMAL LOW (ref 5–15)
BUN: 17 mg/dL (ref 6–20)
CO2: 31 mmol/L (ref 22–32)
Calcium: 9.3 mg/dL (ref 8.9–10.3)
Chloride: 103 mmol/L (ref 98–111)
Creatinine, Ser: 1.08 mg/dL (ref 0.61–1.24)
GFR, Estimated: >60 mL/min (ref >60)
Glucose, Bld: 108 mg/dL — ABNORMAL HIGH (ref 70–99)
Potassium: 4.8 mmol/L (ref 3.5–5.1)
Sodium: 138 mmol/L (ref 135–145)

## 2021-08-07 LAB — ECHOCARDIOGRAM COMPLETE
Area-P 1/2: 3.11 cm2
S' Lateral: 4.8 cm

## 2021-08-07 LAB — BRAIN NATRIURETIC PEPTIDE: B Natriuretic Peptide: 23.2 pg/mL (ref 0.0–100.0)

## 2021-08-07 MED ORDER — CARVEDILOL 12.5 MG PO TABS
18.7500 mg | ORAL_TABLET | Freq: Two times a day (BID) | ORAL | 3 refills | Status: DC
  Filled 2021-08-07: qty 90, 30d supply, fill #0
  Filled 2021-09-26: qty 90, 30d supply, fill #1

## 2021-08-07 NOTE — Progress Notes (Signed)
?Advanced Heart Failure Clinic Note  ? ? ?PCP: Elsie Stain, MD ?EP: Dr. Quentin Ore ?HF Cardiologist: Dr. Aundra Dubin ? ?HPI: ? ?David Vang is a 31 y.o. WM with morbid obesity and family h/o cardiomyopathy, and new systolic HF.  Patient's father had a nonischemic cardiomyopathy and was implanted with a LVAD in his 13s.  ?  ?Pt was diagnosed w/ CAP in 5/22 and prescribed a course of abx + prednisone w/o improved symptoms. Subsequently, went to [place] on 10/22/20 w/ worsening symptoms + subjective fever.  In the ED, he met early sepsis criteria and CXR and CT concerning for bilateral PNA + CHF. No PE on CT. He was admitted and transferred to [facility] for further care. Placed on Vanc + cefepime. Also started on IV Lasix for CHF.  Echo showed severely reduced LVEF, 20-25% w/ global HK, no LVH, mild-mod MR, RV normal. Advanced Heart Failure consulted.   He underwent R/LHC on 10/25/20 with elevated right and left heart filling pressures, moderate pulmonary venous hypertension, low cardiac output but no CAD. cMRI with extensive airspace disease, right>left likely from recent pneumonia, LV severely dilated with EF 21%, nonspecific RV insertion site LGE.  Patient was started on diuretics and GDMT.  He was also started on amiodarone and fitted with LifeVest for his PVCs. He was discharged on GDMT, weight 227 lbs.  ? ?Echo in 9/22 showed EF 25-30%, severe LV dilation, normal RV.   CPX in 10/22 showed moderate HF limitation.  ? ?Echo was done today and reviewed, EF 30-35% with mild LV dilation, normal RV size with mildly decreased systolic function.  ? ?Today, he returns for HF follow up. Weight is stable.  He is no longer wearing the Lifevest. No dyspnea walking on flat ground and no problems walking up stairs.  No orthopnea/PND.  He walks and rides exercise bike at the gym.  He is working at [employer] 40 hrs/week.  He will be getting insurance soon through work.   ? ?Labs (8/22): K 3.5, creatinine 1.1 ?Labs  (11/22): K 4.2, creatinine 1.14 ? ?ECG (personally reviewed): NSR, nonspecific T wave flattening.  ? ?PMH: ?1. Chronic systolic CHF: Echo (0/30) with EF 20-25%, diffuse hypokinesis, mild-moderate MR, normal RV.  ?- LHC/RHC (6/22): No CAD; mean RA 9, PA 59/25, mean PCWP 24, CI 1.9 (Fick), CI 2.2 (thermo). PVR 2.7 WU.  ?- Cardiac MRI (6/22): Severe LV dilation, EF 21%, RV EF 35%, nonspecific RV insertion site LGE.  ?- Echo (9/22): EF 25-30%, severe LV dilation, global hypokinesis, normal RV.  ?- CPX (10/22): peak VO2 18.9, VE/VCO2 slope 21, RER 1.18 => moderate HF limitation.  ?- Echo (2/23): EF 30-35% with mild LV dilation, normal RV size with mildly decreased systolic function.  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  ? ?Social History  ? ?Socioeconomic History  ? Marital status: Married  ?  Spouse name: Not on file  ? Number of children: Not on file  ? Years of education: Not on file  ? Highest education level: Not on file  ?Occupational History  ? Not on file  ?Tobacco Use  ? Smoking status: Never  ? Smokeless tobacco: Never  ?Substance and Sexual Activity  ? Alcohol use: Not Currently  ? Drug use: Never  ? Sexual activity: Not on file  ?Other Topics Concern  ? Not on file  ?Social History Narrative  ? Not on file  ? ?Social Determinants of Health  ? ?Financial Resource Strain: Medium Risk  ? Difficulty  of Paying Living Expenses: Somewhat hard  ?Food Insecurity: No Food Insecurity  ? Worried About Charity fundraiser in the Last Year: Never true  ? Ran Out of Food in the Last Year: Never true  ?Transportation Needs: No Transportation Needs  ? Lack of Transportation (Medical): No  ? Lack of Transportation (Non-Medical): No  ?Physical Activity: Not on file  ?Stress: Not on file  ?Social Connections: Not on file  ?Intimate Partner Violence: Not on file  ? ?Family History  ?Problem Relation Age of Onset  ? Heart failure Father   ?     dx'ed with cardiomyoopathy in his late 52's/40's, and ultimately required LVAD  ? ? ?ROS:  All systems reviewed and negative except as per HPI.   ? ? ?Current Outpatient Medications  ?Medication Sig Dispense Refill  ? dapagliflozin propanediol (FARXIGA) 10 MG TABS tablet Take 1 tablet (10 mg total) by mouth daily. 30 tablet 3  ? furosemide (LASIX) 40 MG tablet Take 1 tablet (40 mg total) by mouth every morning AND 0.5 tablets (20 mg total) every evening. 45 tablet 5  ? potassium chloride SA (KLOR-CON M) 20 MEQ tablet Take 1 tablet (20 mEq total) by mouth daily. 30 tablet 5  ? sacubitril-valsartan (ENTRESTO) 97-103 MG Take 1 tablet by mouth 2 (two) times daily. 180 tablet 3  ? sertraline (ZOLOFT) 25 MG tablet Take 1 tablet (25 mg total) by mouth daily. 30 tablet 11  ? spironolactone (ALDACTONE) 25 MG tablet Take 1 tablet (25 mg total) by mouth daily. 30 tablet 11  ? carvedilol (COREG) 12.5 MG tablet Take 1.5 tablets (18.75 mg total) by mouth 2 (two) times daily with a meal. 180 tablet 3  ? ?No current facility-administered medications for this encounter.  ? ?Allergies  ?Allergen Reactions  ? Benadryl [Diphenhydramine] Other (See Comments)  ?  Pass out  ? ? ?BP 118/78   Pulse (!) 57   Wt 114.5 kg (252 lb 6.4 oz)   SpO2 97%   BMI 39.53 kg/m?  ? ?Wt Readings from Last 3 Encounters:  ?08/07/21 114.5 kg (252 lb 6.4 oz)  ?05/31/21 113.8 kg (250 lb 12.8 oz)  ?05/23/21 113.4 kg (250 lb)  ? ?PHYSICAL EXAM: ?General: NAD ?Neck: No JVD, no thyromegaly or thyroid nodule.  ?Lungs: Clear to auscultation bilaterally with normal respiratory effort. ?CV: Nondisplaced PMI.  Heart regular S1/S2, no S3/S4, no murmur.  No peripheral edema.  No carotid bruit.  Normal pedal pulses.  ?Abdomen: Soft, nontender, no hepatosplenomegaly, no distention.  ?Skin: Intact without lesions or rashes.  ?Neurologic: Alert and oriented x 3.  ?Psych: Normal affect. ?Extremities: No clubbing or cyanosis.  ?HEENT: Normal.  ? ?ASSESSMENT & PLAN: ?1. 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 Echo in 11/15/2022 showed EF 20-25%, no LVH, mild-moderate MR, normal RV.  No ETOH or drugs.  RHC/LHC with no coronary disease, moderately elevated filling pressures and low cardiac output (2.2 thermo, 1.9 Fick).  Patient also noted to have very frequent PVCs.  Cardiac MRI showed severe LV dilation, EF 21%, nonspecific RV insertion site LGE.  Possible causes of CMP include PVC-mediated, viral myocarditis, or familial CMP.  Based on strong family history, familial CMP is a strong possibility.  Echo in 9/22 showed EF still low at 25-30%.  CPX in 10/22 showed moderate HF  limitation.  Echo today showed that EF is still low in the 30-35% range.  NYHA class I-II symptoms.  He is not volume overloaded on exam.  ?- Continue lasix 40 qam/20 qpm. BMET/BNP today.  ?- Continue Entresto 97/103 mg bid.  ?- Increase Coreg to 18.75 mg bid.  ?- Continue spironolactone 25 mg daily. ?- Continue Farxiga 10 mg daily.  ?- Off digoxin (previously stopped with elevated level). Will not restart with NYHA I-II symptoms. ?- I am going to send off gene testing for common familial cardiomyopathies.   ?- Suspect OSA, needs sleep study, but has no insurance. HFSW working on this. ?- EF remains low, recommend ICD.  He will be getting health insurance in the next couple of months, and I will get him in with Dr. Quentin Ore when he has insurance so that he can get his ICD.  Narrow QRS, not CRT candidate.   ?2. PVCs: Frequent, often bigeminal.  Mexiletine tried without much effect, then started on amiodarone. 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 Amiodarone was stopped by Dr. Quentin Ore to see how he was doing off it now that he is on a good GDMT regimen.  ?- Possible candidate for PVC ablation, but will need insurance. SW working on this.  Followup with Dr. Quentin Ore.  ?3. Depression:  Continue sertraline 25 mg daily.  ?4. Obesity: Body mass index is 39.53 kg/m?. ?- Unable to get semaglutide without insurance.   ? ?Followup in 3 months.  Needs appt with Dr. Quentin Ore when he gets insurance to get ICD.

## 2021-08-07 NOTE — Progress Notes (Signed)
Blood collected for TTR genetic testing per Dr McLean.  Order form completed and both shipped by Fed[employer] to [facility].  

## 2021-08-07 NOTE — Patient Instructions (Signed)
Medication Changes: ? ?Increase Carvedilol to 18.75 mg Twice daily ? ? ?Lab Work: ? ?Labs done today, your results will be available in MyChart, we will contact you for abnormal readings. ? ?Genetic test has been done, this has to be sent to [place] to be processed and can take 1-2 weeks to get results back.  We will let you know the results. ? ? ?Testing/Procedures: ? ?none ? ?Referrals: ? ?none ? ?Special Instructions // Education: ? ?none ? ?Follow-Up in: 3 months ? ?At the Advanced Heart Failure Clinic, you and your health needs are our priority. We have a designated team specialized in the treatment of Heart Failure. This Care Team includes your primary Heart Failure Specialized Cardiologist (physician), Advanced Practice Providers (APPs- Physician Assistants and Nurse Practitioners), and Pharmacist who all work together to provide you with the care you need, when you need it.  ? ?You may see any of the following providers on your designated Care Team at your next follow up: ? ?Dr Arvilla Meres ?Dr Marca Ancona ?Tonye Becket, NP ?Robbie Lis, PA ?Jessica Milford,NP ?Anna Genre, PA ?Karle Plumber, PharmD ? ? ?Please be sure to bring in all your medications bottles to every appointment.  ? ?Need to Contact us: ? ?If you have any questions or concerns before your next appointment please send [place] a message through [place] or call our office at [phone].   ? ?TO LEAVE A MESSAGE FOR THE NURSE SELECT OPTION 2, PLEASE LEAVE A MESSAGE INCLUDING: ?YOUR NAME ?DATE OF BIRTH ?CALL BACK NUMBER ?REASON FOR CALL**this is important as we prioritize the call backs ? ?YOU WILL RECEIVE A CALL BACK THE SAME DAY AS LONG AS YOU CALL BEFORE 4:00 PM ? ? ?

## 2021-08-30 ENCOUNTER — Other Ambulatory Visit (HOSPITAL_COMMUNITY): Payer: Self-pay

## 2021-09-06 ENCOUNTER — Other Ambulatory Visit (HOSPITAL_COMMUNITY): Payer: Self-pay

## 2021-09-10 ENCOUNTER — Telehealth (HOSPITAL_COMMUNITY): Payer: Self-pay | Admitting: Pharmacy Technician

## 2021-09-10 NOTE — Telephone Encounter (Signed)
Advanced Heart Failure Patient Advocate Encounter ? ?Received a patient assistance renewal application for Farxiga from [employer]. The patient can utilize the hf fund once the enrollment period ends on 11/07/21 as long as he remains uninsured.  ? ?Charlann Boxer, CPhT ? ?

## 2021-09-26 ENCOUNTER — Other Ambulatory Visit (HOSPITAL_COMMUNITY): Payer: Self-pay | Admitting: Cardiology

## 2021-09-27 ENCOUNTER — Other Ambulatory Visit (HOSPITAL_COMMUNITY): Payer: Self-pay

## 2021-09-27 MED ORDER — FUROSEMIDE 40 MG PO TABS
ORAL_TABLET | ORAL | 5 refills | Status: DC
  Filled 2021-09-27: qty 45, 30d supply, fill #0
  Filled 2021-11-01: qty 45, 30d supply, fill #1
  Filled 2021-12-21: qty 45, 30d supply, fill #2
  Filled 2022-01-25: qty 45, 30d supply, fill #3
  Filled 2022-03-07: qty 45, 30d supply, fill #4
  Filled 2022-05-17 (×2): qty 45, 30d supply, fill #5

## 2021-11-01 ENCOUNTER — Other Ambulatory Visit (HOSPITAL_COMMUNITY): Payer: Self-pay

## 2021-11-01 ENCOUNTER — Other Ambulatory Visit (HOSPITAL_COMMUNITY): Payer: Self-pay | Admitting: Cardiology

## 2021-11-01 MED ORDER — POTASSIUM CHLORIDE CRYS ER 20 MEQ PO TBCR
20.0000 meq | EXTENDED_RELEASE_TABLET | Freq: Every day | ORAL | 3 refills | Status: DC
  Filled 2021-11-01: qty 30, 30d supply, fill #0
  Filled 2021-12-19: qty 30, 30d supply, fill #1
  Filled 2022-01-14: qty 30, 30d supply, fill #2
  Filled 2022-04-01: qty 30, 30d supply, fill #3
  Filled 2022-05-17 (×2): qty 30, 30d supply, fill #4
  Filled 2022-07-19: qty 30, 30d supply, fill #5
  Filled 2022-10-14: qty 30, 30d supply, fill #6

## 2021-11-09 ENCOUNTER — Other Ambulatory Visit (HOSPITAL_COMMUNITY): Payer: Self-pay

## 2021-11-09 MED ORDER — SACUBITRIL-VALSARTAN 97-103 MG PO TABS: 1.0000 | ORAL_TABLET | Freq: Two times a day (BID) | ORAL | 3 refills | Status: DC

## 2021-11-14 ENCOUNTER — Telehealth (HOSPITAL_COMMUNITY): Payer: Self-pay | Admitting: Pharmacy Technician

## 2021-11-14 NOTE — Telephone Encounter (Signed)
Advanced Heart Failure Patient Advocate Encounter  Received provider portion of Entresto assistance application. Sent provider portion to [employer] 06/16.

## 2021-11-21 ENCOUNTER — Ambulatory Visit (HOSPITAL_COMMUNITY)
Admission: RE | Admit: 2021-11-21 | Discharge: 2021-11-21 | Disposition: A | Discharge status: Home / Self Care | Payer: Self-pay | Source: Ambulatory Visit | Attending: Cardiology | Admitting: Cardiology

## 2021-11-21 ENCOUNTER — Other Ambulatory Visit (HOSPITAL_COMMUNITY): Payer: Self-pay

## 2021-11-21 ENCOUNTER — Other Ambulatory Visit (HOSPITAL_COMMUNITY): Payer: Self-pay | Admitting: Cardiology

## 2021-11-21 VITALS — BP 138/82 | HR 82 | Wt 260.0 lb

## 2021-11-21 DIAGNOSIS — Z6841 Body Mass Index (BMI) 40.0 and over, adult: Secondary | ICD-10-CM | POA: Insufficient documentation

## 2021-11-21 DIAGNOSIS — E669 Obesity, unspecified: Secondary | ICD-10-CM | POA: Insufficient documentation

## 2021-11-21 DIAGNOSIS — Z7984 Long term (current) use of oral hypoglycemic drugs: Secondary | ICD-10-CM | POA: Insufficient documentation

## 2021-11-21 DIAGNOSIS — Z8249 Family history of ischemic heart disease and other diseases of the circulatory system: Secondary | ICD-10-CM | POA: Insufficient documentation

## 2021-11-21 DIAGNOSIS — I493 Ventricular premature depolarization: Secondary | ICD-10-CM | POA: Insufficient documentation

## 2021-11-21 DIAGNOSIS — Z79899 Other long term (current) drug therapy: Secondary | ICD-10-CM | POA: Insufficient documentation

## 2021-11-21 DIAGNOSIS — I428 Other cardiomyopathies: Secondary | ICD-10-CM | POA: Insufficient documentation

## 2021-11-21 DIAGNOSIS — I5022 Chronic systolic (congestive) heart failure: Secondary | ICD-10-CM | POA: Insufficient documentation

## 2021-11-21 LAB — BASIC METABOLIC PANEL
Anion gap: 11 (ref 5–15)
BUN: 13 mg/dL (ref 6–20)
CO2: 27 mmol/L (ref 22–32)
Calcium: 9.3 mg/dL (ref 8.9–10.3)
Chloride: 100 mmol/L (ref 98–111)
Creatinine, Ser: 1.02 mg/dL (ref 0.61–1.24)
GFR, Estimated: >60 mL/min (ref >60)
Glucose, Bld: 192 mg/dL — ABNORMAL HIGH (ref 70–99)
Potassium: 4.2 mmol/L (ref 3.5–5.1)
Sodium: 138 mmol/L (ref 135–145)

## 2021-11-21 LAB — BRAIN NATRIURETIC PEPTIDE: B Natriuretic Peptide: 38.9 pg/mL (ref 0.0–100.0)

## 2021-11-21 MED ORDER — CARVEDILOL 25 MG PO TABS
25.0000 mg | ORAL_TABLET | Freq: Two times a day (BID) | ORAL | 3 refills | Status: DC
  Filled 2021-11-21: qty 60, 30d supply, fill #0
  Filled 2021-12-28: qty 60, 30d supply, fill #1
  Filled 2022-01-25: qty 60, 30d supply, fill #2
  Filled 2022-05-17 (×2): qty 60, 30d supply, fill #3
  Filled 2022-08-05: qty 60, 30d supply, fill #4
  Filled 2022-10-14: qty 60, 30d supply, fill #5

## 2021-11-21 MED ORDER — DAPAGLIFLOZIN PROPANEDIOL 10 MG PO TABS
10.0000 mg | ORAL_TABLET | Freq: Every day | ORAL | 3 refills | Status: DC
  Filled 2021-11-21: qty 30, 30d supply, fill #0
  Filled 2022-01-14: qty 30, 30d supply, fill #1
  Filled 2022-03-07: qty 30, 30d supply, fill #2

## 2021-11-21 NOTE — Patient Instructions (Signed)
Increase Carvedilol to 25 mg Twice daily  Labs done today, your results will be available in MyChart, we will contact you for abnormal readings.  You have been referred to Dr. Jomarie Longs. Her office will call you to make an appointment.  If you have any questions or concerns before your next appointment please send [place] a message through [place] or call our office at [phone].    TO LEAVE A MESSAGE FOR THE NURSE SELECT OPTION 2, PLEASE LEAVE A MESSAGE INCLUDING: YOUR NAME DATE OF BIRTH CALL BACK NUMBER REASON FOR CALL**this is important as we prioritize the call backs  YOU WILL RECEIVE A CALL BACK THE SAME DAY AS LONG AS YOU CALL BEFORE 4:00 PM  At the Advanced [facility] Failure Clinic, you and your health needs are our priority. As part of our continuing mission to provide you with exceptional heart care, we have created designated Provider Care Teams. These Care Teams include your primary Cardiologist (physician) and Advanced Practice Providers (APPs- Physician Assistants and Nurse Practitioners) who all work together to provide you with the care you need, when you need it.   You may see any of the following providers on your designated Care Team at your next follow up: Dr Arvilla Meres Dr Carron Curie, NP Robbie Lis, [place] [facility] [place], [place] Karle Plumber, PharmD   Please be sure to bring in all your medications bottles to every appointment.

## 2021-11-21 NOTE — Progress Notes (Signed)
Medication Samples have been provided to the patient.  Drug name: Sherryll Burger       Strength: 49/51        Qty: 2 bottles  LOT: [id number]  Exp.Date: 07/25  Dosing instructions: take 2 tablest twice a day   The patient has been instructed regarding the correct time, dose, and frequency of taking this medication, including desired effects and most common side effects.   Braeson Rupe M Khiree Bukhari 2:14 PM 11/21/2021

## 2021-11-21 NOTE — Progress Notes (Signed)
Advanced Heart Failure Clinic Note    PCP: Elsie Stain, MD EP: Dr. Quentin Ore HF Cardiologist: Dr. Aundra Dubin  HPI:  David Vang is a 31 y.o. WM with morbid obesity and family h/o cardiomyopathy, and new systolic HF.  Patient's father had a nonischemic cardiomyopathy and was implanted with a LVAD in his 60s.    Pt was diagnosed w/ CAP in 5/22 and prescribed a course of abx + prednisone w/o improved symptoms. Subsequently, went to [place] on 10/22/20 w/ worsening symptoms + subjective fever.  In the ED, he met early sepsis criteria and CXR and CT concerning for bilateral PNA + CHF. No PE on CT. He was admitted and transferred to [facility] for further care. Placed on Vanc + cefepime. Also started on IV Lasix for CHF.  Echo showed severely reduced LVEF, 20-25% w/ global HK, no LVH, mild-mod MR, RV normal. Advanced Heart Failure consulted.   He underwent R/LHC on 10/25/20 with elevated right and left heart filling pressures, moderate pulmonary venous hypertension, low cardiac output but no CAD. cMRI with extensive airspace disease, right>left likely from recent pneumonia, LV severely dilated with EF 21%, nonspecific RV insertion site LGE.  Patient was started on diuretics and GDMT.  He was also started on amiodarone and fitted with LifeVest for his PVCs. He was discharged on GDMT, weight 227 lbs.   Echo in 9/22 showed EF 25-30%, severe LV dilation, normal RV.   CPX in 10/22 showed moderate HF limitation.   Echo in 2/23 showed EF 30-35% with mild LV dilation, normal RV size with mildly decreased systolic function.   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    Today, he returns for HF follow up. Weight is up about 8 lbs.  He does not think this is due to fluid.  He has not been exercising much, working overtime at [employer].  He got a recent promotion.  He should get insurance coverage later this year.  No palpitations or lightheadedness. No exertional dyspnea, able to do  yardwork without problems.  No chest pain.  No orthopnea/PND.   Labs (8/22): K 3.5, creatinine 1.1 Labs (11/22): K 4.2, creatinine 1.14 Labs (3/23): BNP 23, K 4.8, creatinine 1.08  PMH: 1. Chronic systolic CHF: Echo (1/75) with EF 20-25%, diffuse hypokinesis, mild-moderate MR, normal RV.  - LHC/RHC (6/22): No CAD; mean RA 9, PA 59/25, mean PCWP 24, CI 1.9 (Fick), CI 2.2 (thermo). PVR 2.7 WU.  - Cardiac MRI (6/22): Severe LV dilation, EF 21%, RV EF 35%, nonspecific RV insertion site LGE.  - Echo (9/22): EF 25-30%, severe LV dilation, global hypokinesis, normal RV.  - CPX (10/22): peak VO2 18.9, VE/VCO2 slope 21, RER 1.18 => moderate HF limitation.  - Echo (2/23): EF 30-35% with mild LV dilation, normal RV size with mildly decreased systolic function.  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   Social History   Socioeconomic History   Marital status: Married    Spouse name: Not on file   Number of children: Not on file   Years of education: Not on file   Highest education level: Not on file  Occupational History   Not on file  Tobacco Use   Smoking status: Never   Smokeless tobacco: Never  Substance and Sexual Activity   Alcohol use: Not Currently   Drug use: Never   Sexual activity: Not on file  Other Topics Concern   Not on  file  Social History Narrative   Not on file   Social Determinants of Health   Financial Resource Strain: Medium Risk (10/24/2020)   Overall Financial Resource Strain (CARDIA)    Difficulty of Paying Living Expenses: Somewhat hard  Food Insecurity: No Food Insecurity (10/24/2020)   Hunger Vital Sign    Worried About Running Out of Food in the Last Year: Never true    Ran Out of Food in the Last Year: Never true  Transportation Needs: No Transportation Needs (10/24/2020)   PRAPARE - Hydrologist (Medical): No    Lack of Transportation (Non-Medical): No  Physical Activity: Not  on file  Stress: Not on file  Social Connections: Not on file  Intimate Partner Violence: Not on file   Family History  Problem Relation Age of Onset   Heart failure Father        dx'ed with cardiomyoopathy in his late 67's/40's, and ultimately required LVAD    ROS: All systems reviewed and negative except as per HPI.     Current Outpatient Medications  Medication Sig Dispense Refill   furosemide (LASIX) 40 MG tablet Take 1 tablet (40 mg total) by mouth every morning AND 0.5 tablets (20 mg total) every evening. 45 tablet 5   potassium chloride SA (KLOR-CON M) 20 MEQ tablet Take 1 tablet (20 mEq total) by mouth daily. 90 tablet 3   sacubitril-valsartan (ENTRESTO) 97-103 MG Take 1 tablet by mouth 2 (two) times daily. 180 tablet 3   sertraline (ZOLOFT) 25 MG tablet Take 1 tablet (25 mg total) by mouth daily. 30 tablet 11   spironolactone (ALDACTONE) 25 MG tablet Take 1 tablet (25 mg total) by mouth daily. 30 tablet 11   carvedilol (COREG) 25 MG tablet Take 1 tablet (25 mg total) by mouth 2 (two) times daily with a meal. 180 tablet 3   dapagliflozin propanediol (FARXIGA) 10 MG TABS tablet Take 1 tablet (10 mg total) by mouth daily. 90 tablet 3   No current facility-administered medications for this encounter.   Allergies  Allergen Reactions   Benadryl [Diphenhydramine] Other (See Comments)    Pass out    BP 138/82   Pulse 82   Wt 117.9 kg (260 lb)   SpO2 98%   BMI 40.72 kg/m   Wt Readings from Last 3 Encounters:  11/21/21 117.9 kg (260 lb)  08/07/21 114.5 kg (252 lb 6.4 oz)  05/31/21 113.8 kg (250 lb 12.8 oz)   PHYSICAL EXAM: General: NAD Neck: No JVD, no thyromegaly or thyroid nodule.  Lungs: Clear to auscultation bilaterally with normal respiratory effort. CV: Nondisplaced PMI.  Heart regular S1/S2, no S3/S4, no murmur.  No peripheral edema.  No carotid bruit.  Normal pedal pulses.  Abdomen: Soft, nontender, no hepatosplenomegaly, no distention.  Skin: Intact without  lesions or rashes.  Neurologic: Alert and oriented x 3.  Psych: Normal affect. Extremities: No clubbing or cyanosis.  HEENT: Normal.   ASSESSMENT & PLAN: 1. 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 Echo in 10/20/2022 showed EF 20-25%, no LVH, mild-moderate MR, normal RV.  No ETOH or drugs.  RHC/LHC with no coronary disease, moderately elevated filling pressures and low cardiac output (2.2 thermo, 1.9 Fick).  Patient also noted to have very frequent PVCs.  Cardiac MRI showed severe LV dilation,  EF 21%, nonspecific RV insertion site LGE.  Possible causes of CMP include PVC-mediated, viral myocarditis, or familial CMP.  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  Echo in 9/22 showed EF still low at 25-30%.  CPX in 10/22 showed moderate HF limitation.  Echo in 2/23 showed that EF is still low in the 30-35% range.  NYHA class I-II symptoms.  He is not volume overloaded on exam.  - Continue lasix 40 qam/20 qpm. BMET/BNP today.  - Continue Entresto 97/103 mg bid.  - Increase Coreg to 25 mg bid.  - Continue spironolactone 25 mg daily. - Continue Farxiga 10 mg daily.  - Off digoxin (previously stopped with elevated level). Will not restart with NYHA I-II symptoms. - Suspect OSA, needs sleep study, but has no insurance.  - EF remains low, recommend ICD.  He will be getting health insurance hopefully by the end of the year, and I will get him in with Dr. Quentin Ore when he has insurance so that he can get his ICD.  Narrow QRS, not CRT candidate.   - I will refer to Dr. 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  2. PVCs: Frequent, often bigeminal.  Mexiletine tried  without much effect, then started on amiodarone. 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 Amiodarone was stopped by Dr. Quentin Ore to see how he was doing off it now that he is on a good GDMT regimen.  - Possible candidate for PVC ablation, but will need insurance. Hopefully will have by the end of the year. Followup with Dr. Quentin Ore.  3. Depression: Continue sertraline 25 mg daily.  4. Obesity: Body mass index is 40.72 kg/m. - Unable to get semaglutide without insurance.   - Increase exercise.   Followup in 3 months with APP.  Needs appt with Dr. Quentin Ore when he gets insurance to get ICD.   Loralie Champagne 11/21/2021

## 2021-12-19 ENCOUNTER — Other Ambulatory Visit (HOSPITAL_COMMUNITY): Payer: Self-pay

## 2021-12-21 ENCOUNTER — Other Ambulatory Visit (HOSPITAL_COMMUNITY): Payer: Self-pay

## 2021-12-28 ENCOUNTER — Other Ambulatory Visit (HOSPITAL_COMMUNITY): Payer: Self-pay

## 2022-01-14 ENCOUNTER — Other Ambulatory Visit (HOSPITAL_COMMUNITY): Payer: Self-pay

## 2022-01-25 ENCOUNTER — Other Ambulatory Visit (HOSPITAL_COMMUNITY): Payer: Self-pay

## 2022-02-20 ENCOUNTER — Other Ambulatory Visit (HOSPITAL_COMMUNITY): Payer: Self-pay

## 2022-02-20 ENCOUNTER — Telehealth (HOSPITAL_COMMUNITY): Payer: Self-pay

## 2022-02-20 ENCOUNTER — Encounter (HOSPITAL_COMMUNITY): Payer: Self-pay

## 2022-02-20 NOTE — Telephone Encounter (Signed)
Advanced Heart Failure Patient Advocate Encounter  Review of patient chart in anticipation of changes to the HF Fund. Patient has insurance coverage under [employer].  This new plan does not cover Wilder Glade; Vania Rea is preferred. Sending new rx to patient pharmacy, left voicemail with patient and sent MyChart link with Jardiance copay savings plan link.  Clista Bernhardt, CPhT Rx Patient Advocate Phone: [phone]

## 2022-02-20 NOTE — Telephone Encounter (Signed)
Advanced Heart Failure Patient Advocate Encounter  Patient has new insurance under [employer]. Prior authorization is required for Entresto 97-103MG  tablets.  Submitted: 02/20/2022 [place], CPhT Rx Patient Advocate Phone: [phone]

## 2022-02-20 NOTE — Telephone Encounter (Signed)
Advanced Heart Failure Patient Advocate Encounter  Prior Authorization for Delene Loll has been approved.   Effective: 02/20/2022 to 02/21/2023  Clista Bernhardt, CPhT Rx Patient Advocate Phone: [phone]

## 2022-02-21 ENCOUNTER — Encounter (HOSPITAL_COMMUNITY): Payer: Self-pay

## 2022-03-08 ENCOUNTER — Other Ambulatory Visit (HOSPITAL_COMMUNITY): Payer: Self-pay

## 2022-03-11 ENCOUNTER — Other Ambulatory Visit (HOSPITAL_COMMUNITY): Payer: Self-pay | Admitting: Pharmacist

## 2022-03-11 ENCOUNTER — Other Ambulatory Visit (HOSPITAL_COMMUNITY): Payer: Self-pay

## 2022-03-11 ENCOUNTER — Ambulatory Visit (HOSPITAL_COMMUNITY)
Admission: RE | Admit: 2022-03-11 | Discharge: 2022-03-11 | Disposition: A | Discharge status: Home / Self Care | Payer: 59 | Source: Ambulatory Visit | Attending: Family Medicine | Admitting: Family Medicine

## 2022-03-11 ENCOUNTER — Encounter (HOSPITAL_COMMUNITY): Payer: Self-pay

## 2022-03-11 VITALS — BP 108/70 | HR 66 | Wt 258.8 lb

## 2022-03-11 DIAGNOSIS — Z6841 Body Mass Index (BMI) 40.0 and over, adult: Secondary | ICD-10-CM | POA: Diagnosis not present

## 2022-03-11 DIAGNOSIS — Z7901 Long term (current) use of anticoagulants: Secondary | ICD-10-CM | POA: Insufficient documentation

## 2022-03-11 DIAGNOSIS — Z79899 Other long term (current) drug therapy: Secondary | ICD-10-CM | POA: Insufficient documentation

## 2022-03-11 DIAGNOSIS — Z8249 Family history of ischemic heart disease and other diseases of the circulatory system: Secondary | ICD-10-CM | POA: Insufficient documentation

## 2022-03-11 DIAGNOSIS — F32A Depression, unspecified: Secondary | ICD-10-CM | POA: Diagnosis not present

## 2022-03-11 DIAGNOSIS — Z7984 Long term (current) use of oral hypoglycemic drugs: Secondary | ICD-10-CM | POA: Insufficient documentation

## 2022-03-11 DIAGNOSIS — I493 Ventricular premature depolarization: Secondary | ICD-10-CM

## 2022-03-11 DIAGNOSIS — R008 Other abnormalities of heart beat: Secondary | ICD-10-CM | POA: Diagnosis not present

## 2022-03-11 DIAGNOSIS — I5022 Chronic systolic (congestive) heart failure: Secondary | ICD-10-CM | POA: Diagnosis present

## 2022-03-11 DIAGNOSIS — I272 Pulmonary hypertension, unspecified: Secondary | ICD-10-CM | POA: Insufficient documentation

## 2022-03-11 DIAGNOSIS — I420 Dilated cardiomyopathy: Secondary | ICD-10-CM | POA: Diagnosis not present

## 2022-03-11 LAB — BASIC METABOLIC PANEL
Anion gap: 5 (ref 5–15)
BUN: 10 mg/dL (ref 6–20)
CO2: 27 mmol/L (ref 22–32)
Calcium: 8.5 mg/dL — ABNORMAL LOW (ref 8.9–10.3)
Chloride: 106 mmol/L (ref 98–111)
Creatinine, Ser: 1.04 mg/dL (ref 0.61–1.24)
GFR, Estimated: >60 mL/min (ref >60)
Glucose, Bld: 109 mg/dL — ABNORMAL HIGH (ref 70–99)
Potassium: 3.9 mmol/L (ref 3.5–5.1)
Sodium: 138 mmol/L (ref 135–145)

## 2022-03-11 MED ORDER — EMPAGLIFLOZIN 10 MG PO TABS
10.0000 mg | ORAL_TABLET | Freq: Every day | ORAL | 6 refills | Status: DC
  Filled 2022-03-11: qty 30, 30d supply, fill #0
  Filled 2022-10-17: qty 30, 30d supply, fill #1
  Filled 2022-12-26 (×3): qty 30, 30d supply, fill #2
  Filled 2023-02-20: qty 30, 30d supply, fill #3

## 2022-03-11 MED ORDER — SACUBITRIL-VALSARTAN 97-103 MG PO TABS: 1.0000 | ORAL_TABLET | Freq: Two times a day (BID) | ORAL | 6 refills | Status: DC

## 2022-03-11 MED ORDER — SACUBITRIL-VALSARTAN 97-103 MG PO TABS
1.0000 | ORAL_TABLET | Freq: Two times a day (BID) | ORAL | 3 refills | Status: DC
  Filled 2022-03-11 (×2): qty 60, 30d supply, fill #0

## 2022-03-11 NOTE — Patient Instructions (Addendum)
Thank you for coming in today  Labs were done today, if any labs are abnormal the clinic will call you No news is good news  RESTART Entresto 97/103 1 tablet twice daily START Jardiance 10 mg 1 tablet daily  EKG today  You have been referred to EP clinic and they will contact you for further appointment details   Your physician recommends that you schedule a follow-up appointment in:  3-4 months with Dr. Kendall Flack have been referred for a home sleep study and will be called to know when to start.    Do the following things EVERYDAY: Weigh yourself in the morning before breakfast. Write it down and keep it in a log. Take your medicines as prescribed Eat low salt foods--Limit salt (sodium) to 2000 mg per day.  Stay as active as you can everyday Limit all fluids for the day to less than 2 liters  At the [facility], you and your health needs are our priority. As part of our continuing mission to provide you with exceptional heart care, we have created designated Provider Care Teams. These Care Teams include your primary Cardiologist (physician) and Advanced Practice Providers (APPs- Physician Assistants and Nurse Practitioners) who all work together to provide you with the care you need, when you need it.   You may see any of the following providers on your designated Care Team at your next follow up: Dr Glori Bickers Dr Loralie Champagne Dr. Roxana Hires, NP Lyda Jester, [place] [facility] [place], [place] Forestine Na, NP Audry Riles, PharmD   Please be sure to bring in all your medications bottles to every appointment.   If you have any questions or concerns before your next appointment please send [place] a message through [place] or call our office at [phone].    TO LEAVE A MESSAGE FOR THE NURSE SELECT OPTION 2, PLEASE LEAVE A MESSAGE INCLUDING: YOUR NAME DATE OF BIRTH CALL BACK NUMBER REASON FOR CALL**this is important as we  prioritize the call backs  YOU WILL RECEIVE A CALL BACK THE SAME DAY AS LONG AS YOU CALL BEFORE 4:00 PM

## 2022-03-11 NOTE — Progress Notes (Signed)
Advanced Heart Failure Clinic Note    PCP: Elsie Stain, MD EP: Dr. Quentin Ore HF Cardiologist: Dr. Aundra Dubin  HPI:  David Vang is a 31 y.o. WM with morbid obesity and family h/o cardiomyopathy, and new systolic HF.  Patient's father had a nonischemic cardiomyopathy and was implanted with a LVAD in his 38s.    Pt was diagnosed w/ CAP in 5/22 and prescribed a course of abx + prednisone w/o improved symptoms. Subsequently, went to [place] on 10/22/20 w/ worsening symptoms + subjective fever.  In the ED, he met early sepsis criteria and CXR and CT concerning for bilateral PNA + CHF. No PE on CT. He was admitted and transferred to [facility] for further care. Placed on Vanc + cefepime. Also started on IV Lasix for CHF.  Echo showed severely reduced LVEF, 20-25% w/ global HK, no LVH, mild-mod MR, RV normal. Advanced Heart Failure consulted.   He underwent R/LHC on 10/25/20 with elevated right and left heart filling pressures, moderate pulmonary venous hypertension, low cardiac output but no CAD. cMRI with extensive airspace disease, right>left likely from recent pneumonia, LV severely dilated with EF 21%, nonspecific RV insertion site LGE.  Patient was started on diuretics and GDMT.  He was also started on amiodarone and fitted with LifeVest for his PVCs. He was discharged on GDMT, weight 227 lbs.   Echo in 9/22 showed EF 25-30%, severe LV dilation, normal RV.   CPX in 10/22 showed moderate HF limitation.   Echo in 2/23 showed EF 30-35% with mild LV dilation, normal RV size with mildly decreased systolic function.   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    Today he returns for HF follow up. Overall feeling fine. 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 He is not short of breath with activity or work duties. Denies palpitations, abnormal bleeding, CP, dizziness, edema, or PND/Orthopnea. Appetite ok. No fever or chills. Weight at home stable.  Now has insurance. Out of [place] x 5 days, too expensive. Wife says he snores.  ECG (personally reviewed): NSR 71 bpm  Labs (8/22): K 3.5, creatinine 1.1 Labs (11/22): K 4.2, creatinine 1.14 Labs (3/23): BNP 23, K 4.8, creatinine 1.08 Labs (6/23): K 4.2, creatinine 1.02  PMH: 1. Chronic systolic CHF: Echo (1/66) with EF 20-25%, diffuse hypokinesis, mild-moderate MR, normal RV.  - LHC/RHC (6/22): No CAD; mean RA 9, PA 59/25, mean PCWP 24, CI 1.9 (Fick), CI 2.2 (thermo). PVR 2.7 WU.  - Cardiac MRI (6/22): Severe LV dilation, EF 21%, RV EF 35%, nonspecific RV insertion site LGE.  - Echo (9/22): EF 25-30%, severe LV dilation, global hypokinesis, normal RV.  - CPX (10/22): peak VO2 18.9, VE/VCO2 slope 21, RER 1.18 => moderate HF limitation.  - Echo (2/23): EF 30-35% with mild LV dilation, normal RV size with mildly decreased systolic function.  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   Social History   Socioeconomic History   Marital status: Married    Spouse name: Not on file   Number of children: Not on file   Years of education: Not on file   Highest education level: Not on file  Occupational History   Not on file  Tobacco Use   Smoking status: Never   Smokeless tobacco: Never  Substance and Sexual Activity   Alcohol use: Not Currently   Drug use: Never   Sexual  activity: Not on file  Other Topics Concern   Not on file  Social History Narrative   Not on file   Social Determinants of Health   Financial Resource Strain: Medium Risk (10/24/2020)   Overall Financial Resource Strain (CARDIA)    Difficulty of Paying Living Expenses: Somewhat hard  Food Insecurity: No Food Insecurity (10/24/2020)   Hunger Vital Sign    Worried About Running Out of Food in the Last Year: Never true    Ran Out of Food in the Last Year: Never true  Transportation Needs: No Transportation Needs (10/24/2020)   PRAPARE - Armed forces logistics/support/administrative officer (Medical): No    Lack of Transportation (Non-Medical): No  Physical Activity: Not on file  Stress: Not on file  Social Connections: Not on file  Intimate Partner Violence: Not on file   Family History  Problem Relation Age of Onset   Heart failure Father        dx'ed with cardiomyoopathy in his late 62's/40's, and ultimately required LVAD   ROS: All systems reviewed and negative except as per HPI.    Current Outpatient Medications  Medication Sig Dispense Refill   carvedilol (COREG) 25 MG tablet Take 1 tablet (25 mg total) by mouth 2 (two) times daily with a meal. 180 tablet 3   furosemide (LASIX) 40 MG tablet Take 1 tablet (40 mg total) by mouth every morning AND 0.5 tablets (20 mg total) every evening. 45 tablet 5   potassium chloride SA (KLOR-CON M) 20 MEQ tablet Take 1 tablet (20 mEq total) by mouth daily. 90 tablet 3   sertraline (ZOLOFT) 25 MG tablet Take 1 tablet (25 mg total) by mouth daily. 30 tablet 11   spironolactone (ALDACTONE) 25 MG tablet Take 1 tablet (25 mg total) by mouth daily. 30 tablet 11   dapagliflozin propanediol (FARXIGA) 10 MG TABS tablet Take 1 tablet (10 mg total) by mouth daily. (Patient not taking: Reported on 03/11/2022) 90 tablet 3   sacubitril-valsartan (ENTRESTO) 97-103 MG Take 1 tablet by mouth 2 (two) times daily. (Patient not taking: Reported on 03/11/2022) 180 tablet 3   No current facility-administered medications for this encounter.   Allergies  Allergen Reactions   Benadryl [Diphenhydramine] Other (See Comments)    Pass out   BP 108/70   Pulse 66   Wt 117.4 kg (258 lb 12.8 oz)   SpO2 97%   BMI 40.53 kg/m   Wt Readings from Last 3 Encounters:  03/11/22 117.4 kg (258 lb 12.8 oz)  11/21/21 117.9 kg (260 lb)  08/07/21 114.5 kg (252 lb 6.4 oz)   PHYSICAL EXAM: General:  NAD. No resp difficulty HEENT: Normal Neck: Supple. No JVD, thick neck. Carotids 2+ bilat; no bruits. No lymphadenopathy or thryomegaly  appreciated. Cor: PMI nondisplaced. Regular rate & rhythm. No rubs, gallops or murmurs. Lungs: Clear Abdomen: Soft, obese, nontender, nondistended. No hepatosplenomegaly. No bruits or masses. Good bowel sounds. Extremities: No cyanosis, clubbing, rash, edema Neuro: Alert & oriented x 3, cranial nerves grossly intact. Moves all 4 extremities w/o difficulty. Affect pleasant.  ASSESSMENT & PLAN: 1. 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 Echo in 11-03-22 showed EF 20-25%, no LVH, mild-moderate MR, normal RV.  No ETOH or drugs.  RHC/LHC with no coronary disease, moderately elevated filling pressures and low cardiac  output (2.2 thermo, 1.9 Fick).  Patient also noted to have very frequent PVCs.  Cardiac MRI showed severe LV dilation, EF 21%, nonspecific RV insertion site LGE.  Possible causes of CMP include PVC-mediated, viral myocarditis, or familial CMP.  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  Echo in 9/22 showed EF still low at 25-30%.  CPX in 10/22 showed moderate HF limitation.  Echo in 2/23 showed that EF is still low in the 30-35% range.  NYHA class I-II symptoms.  He is not volume overloaded on exam.  - Continue lasix 40 qam/20 qpm. BMET/BNP today.  - Restart Entresto 97/103 mg bid. Co-pay card given. - Continue Coreg 25 mg bid.  - Continue spironolactone 25 mg daily. - Stop Wilder Glade and start Jardiance 10 mg daily (insurance preference). Co-pay card given. - Off digoxin (previously stopped with elevated level). Will not restart with NYHA I-II symptoms. - Suspect OSA, arrange home sleep study. - EF remains low, recommend ICD.  Refer to Dr Quentin Ore for ICD >  Narrow QRS, not CRT candidate.   - He  has been referred to  Dr. 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  2. PVCs: Frequent, often bigeminal.  Mexiletine tried without much effect, then started on amiodarone. 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 Amiodarone was stopped by Dr. Quentin Ore to see how he was doing off it now that he is on a good GDMT regimen. None on ECG today. - Possible candidate for PVC ablation ? - Follow up with Dr. Quentin Ore as above.  3. Depression: Continue sertraline 25 mg daily.  4. Obesity: Body mass index is 40.53 kg/m. - Consider referral to pharmacy for semaglutide.    Refer to EP for ICD. Follow up in 3 months with Dr. Aundra Dubin.  [facility] FNP-BC 03/11/2022

## 2022-03-12 ENCOUNTER — Other Ambulatory Visit (HOSPITAL_COMMUNITY): Payer: Self-pay

## 2022-03-15 ENCOUNTER — Other Ambulatory Visit (HOSPITAL_COMMUNITY): Payer: Self-pay

## 2022-03-26 ENCOUNTER — Telehealth (HOSPITAL_COMMUNITY): Payer: Self-pay | Admitting: Surgery

## 2022-03-26 NOTE — Telephone Encounter (Signed)
-----   Message from Harvie Junior, [place] sent at 03/26/2022  4:00 PM EDT ----- Regarding: RE: home sleep study No pre cert reqd ----- Message ----- From: Micki Riley, RN Sent: 03/26/2022  11:46 AM EDT To: Harvie Junior, CMA Subject: home sleep study                               Precert?

## 2022-03-26 NOTE — Telephone Encounter (Signed)
Patient contacted and I left a message that he can proceed with home sleep study.

## 2022-04-01 ENCOUNTER — Other Ambulatory Visit (HOSPITAL_COMMUNITY): Payer: Self-pay

## 2022-04-09 ENCOUNTER — Encounter: Payer: Self-pay | Admitting: *Deleted

## 2022-04-09 ENCOUNTER — Encounter: Payer: Self-pay | Admitting: Cardiology

## 2022-04-09 ENCOUNTER — Ambulatory Visit: Payer: 59 | Attending: Cardiology | Admitting: Cardiology

## 2022-04-09 VITALS — BP 122/78 | HR 80 | Ht 66.0 in | Wt 253.8 lb

## 2022-04-09 DIAGNOSIS — I493 Ventricular premature depolarization: Secondary | ICD-10-CM

## 2022-04-09 DIAGNOSIS — I5022 Chronic systolic (congestive) heart failure: Secondary | ICD-10-CM

## 2022-04-09 NOTE — Progress Notes (Signed)
Electrophysiology Office Note:    Date:  04/09/2022   ID:  David Vang, DOB December 16, 1990, MRN [id number]  PCP:  Elsie Stain, MD  [facility] Cardiologist:  None  [facility] Electrophysiologist:  Vickie Epley, MD   Referring MD: Rafael Bihari, FNP   Chief Complaint: CHF  History of Present Illness:    David Vang is a 31 y.o. male who presents for an evaluation of CHF and consideration of ICD implant at the request of Allena Katz, [place]. Their medical history includes chronic systolic congestive heart failure, and morbid obesity.   Previously diagnosed with CAP 09/2020 not improved on abx and prednisone. At the [facility] ED 10/22/20 he met early sepsis criteria. CXR and CT were concerning for bilateral pneumonia and CHF. He was admitted and transferred to [facility] where echocardiogram showed LVEF 20-25% with global HK.  He underwent R/LHC on 10/25/20 with elevated right and left heart filling pressures, moderate pulmonary venous hypertension, low cardiac output but no CAD. He was started on diuretics and GDMT. He was also started on amiodarone and fitted with LifeVest for his PVCs.   He was seen in EP 05/23/2021 where his EKG was negative for PVCs. Amiodarone was stopped.   He saw Allena Katz, FNP on 03/11/2022 where he was doing well with stable weight at home. His EKG showed NSR at 71 bpm. It was noted his father had a history of nonischemic cardiomyopathy and underwent LVAD implant in his 29's. Echo in 2/23 showed that EF was still low in the 30-35% range.  NYHA class I-II symptoms.  He was not volume overloaded on exam. His Wilder Glade was switched to Jardiance 10 mg due to insurance preference. Due to low EF he was referred to EP for ICD > Narrow QRS, not CRT candidate.  Today, he states he is feeling good. He occasionally feels dizzy if he gets up too quickly. This has been somewhat more frequently.  No syncope.  He denies any palpitations, chest pain,  shortness of breath at rest, or peripheral edema. No headaches, syncope, orthopnea, or PND.     Past Medical History:  Diagnosis Date   Morbid obesity [facility])     Past Surgical History:  Procedure Laterality Date   RIGHT/LEFT HEART CATH AND CORONARY ANGIOGRAPHY N/A 10/25/2020   Procedure: RIGHT/LEFT HEART CATH AND CORONARY ANGIOGRAPHY;  Surgeon: Larey Dresser, MD;  Location: [place];  Service: Cardiovascular;  Laterality: N/A;    Current Medications: Current Meds  Medication Sig   carvedilol (COREG) 25 MG tablet Take 1 tablet (25 mg total) by mouth 2 (two) times daily with a meal.   empagliflozin (JARDIANCE) 10 MG TABS tablet Take 1 tablet (10 mg total) by mouth daily before breakfast.   furosemide (LASIX) 40 MG tablet Take 1 tablet (40 mg total) by mouth every morning AND 0.5 tablets (20 mg total) every evening.   potassium chloride SA (KLOR-CON M) 20 MEQ tablet Take 1 tablet (20 mEq total) by mouth daily.   sacubitril-valsartan (ENTRESTO) 97-103 MG Take 1 tablet by mouth 2 (two) times daily.   sertraline (ZOLOFT) 25 MG tablet Take 1 tablet (25 mg total) by mouth daily.   spironolactone (ALDACTONE) 25 MG tablet Take 1 tablet (25 mg total) by mouth daily.     Allergies:   Benadryl [diphenhydramine]   Social History   Socioeconomic History   Marital status: Married    Spouse name: Not on file   Number of children: Not on  file   Years of education: Not on file   Highest education level: Not on file  Occupational History   Not on file  Tobacco Use   Smoking status: Never   Smokeless tobacco: Never  Substance and Sexual Activity   Alcohol use: Not Currently   Drug use: Never   Sexual activity: Not on file  Other Topics Concern   Not on file  Social History Narrative   Not on file   Social Determinants of Health   Financial Resource Strain: Medium Risk (10/24/2020)   Overall Financial Resource Strain (CARDIA)    Difficulty of Paying Living Expenses: Somewhat  hard  Food Insecurity: No Food Insecurity (10/24/2020)   Hunger Vital Sign    Worried About Running Out of Food in the Last Year: Never true    Ran Out of Food in the Last Year: Never true  Transportation Needs: No Transportation Needs (10/24/2020)   PRAPARE - Hydrologist (Medical): No    Lack of Transportation (Non-Medical): No  Physical Activity: Not on file  Stress: Not on file  Social Connections: Not on file     Family History: The patient's family history includes Heart failure in his father.  ROS:   Please see the history of present illness.    (+) Dizziness All other systems reviewed and are negative.  EKGs/Labs/Other Studies Reviewed:    The following studies were reviewed today:  Echo  08/07/2021:  1. Left ventricular ejection fraction, by estimation, is 30 to 35%. The  left ventricle has moderately decreased function. The left ventricle  demonstrates global hypokinesis. The left ventricular internal cavity size  was mildly dilated. Left ventricular  diastolic parameters are consistent with Grade II diastolic dysfunction  (pseudonormalization). The average left ventricular global longitudinal  strain is -13.4 %. The global longitudinal strain is abnormal.   2. Right ventricular systolic function is mildly reduced. The right  ventricular size is normal. Tricuspid regurgitation signal is inadequate  for assessing PA pressure.   3. The mitral valve is normal in structure. Trivial mitral valve  regurgitation. No evidence of mitral stenosis.   4. The aortic valve is tricuspid. Aortic valve regurgitation is not  visualized. No aortic stenosis is present.   5. The inferior vena cava is normal in size with greater than 50%  respiratory variability, suggesting right atrial pressure of 3 mmHg.    Recent Labs: 05/03/2021: ALT 22; TSH 3.614 11/21/2021: B Natriuretic Peptide 38.9 03/11/2022: BUN 10; Creatinine, Ser 1.04; Potassium 3.9; Sodium 138    Recent Lipid Panel    Component Value Date/Time   CHOL 159 10/25/2020 0434   TRIG 68 10/25/2020 0434   HDL 30 (L) 10/25/2020 0434   CHOLHDL 5.3 10/25/2020 0434   VLDL 14 10/25/2020 0434   LDLCALC 115 (H) 10/25/2020 0434    Physical Exam:    VS:  BP 122/78   Pulse 80   Ht _0  (1.676 m)   Wt 253 lb 12.8 oz (115.1 kg)   SpO2 98%   BMI 40.96 kg/m     Wt Readings from Last 3 Encounters:  04/09/22 253 lb 12.8 oz (115.1 kg)  03/11/22 258 lb 12.8 oz (117.4 kg)  11/21/21 260 lb (117.9 kg)     GEN: Well nourished, well developed in no acute distress.  Obese HEENT: Normal NECK: No JVD; No carotid bruits LYMPHATICS: No lymphadenopathy CARDIAC: RRR, no murmurs, rubs, gallops RESPIRATORY:  Clear to auscultation without rales, wheezing  or rhonchi  ABDOMEN: Soft, non-tender, non-distended MUSCULOSKELETAL:  No edema; No deformity  SKIN: Warm and dry NEUROLOGIC:  Alert and oriented x 3 PSYCHIATRIC:  Normal affect       ASSESSMENT:    1. Chronic systolic heart failure ([place])   2. PVC (premature ventricular contraction)   3. Morbid obesity ([place])    PLAN:    In order of problems listed above:  #Chronic systolic heart failure NYHA class II symptoms today.  Warm and dry on exam.  On good medical therapy.  Follows with the heart failure clinic. EF is persistently reduced.  Presents today to discuss ICD implant.  I do think this is indicated given his persistently reduced ejection fraction on good medical therapy, family history and gene mutation.  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  He is referred by Allena Katz, NP for risk stratification of sudden death and consideration of ICD implantation.  At this time, he meets criteria for ICD implantation for primary prevention of sudden death.  I have had a thorough discussion with the patient reviewing  options.  The patient and their family (if available) have had opportunities to ask questions and have them answered. The patient and I have decided together through a shared decision making process to proceed with ICD implant at this time.    Risks, benefits, alternatives to ICD implantation were discussed in detail with the patient today. The patient understands that the risks include but are not limited to bleeding, infection, pneumothorax, perforation, tamponade, vascular damage, renal failure, MI, stroke, death, inappropriate shocks, and lead dislodgement and wishes to proceed.  We will therefore schedule device implantation at the next available time.  We will plan for a [employer] subcutaneous ICD.    Medication Adjustments/Labs and Tests Ordered: Current medicines are reviewed at length with the patient today.  Concerns regarding medicines are outlined above.   No orders of the defined types were placed in this encounter.  No orders of the defined types were placed in this encounter.  I,Mathew Stumpf,acting as a Education administrator for Vickie Epley, MD.,have documented all relevant documentation on the behalf of Vickie Epley, MD,as directed by  Vickie Epley, MD while in the presence of Vickie Epley, MD.  I, Vickie Epley, MD, have reviewed all documentation for this visit. The documentation on 04/09/22 for the exam, diagnosis, procedures, and orders are all accurate and complete.   Signed, Hilton Cork. Quentin Ore, MD, [facility], [facility] 04/09/2022 8:47 AM    Electrophysiology [facility]

## 2022-04-09 NOTE — Progress Notes (Addendum)
Error

## 2022-04-09 NOTE — Patient Instructions (Signed)
Medication Instructions:  No changes *If you need a refill on your cardiac medications before your next appointment, please call your pharmacy*   Lab Work: Jan 5  If you have labs (blood work) drawn today and your tests are completely normal, you will receive your results only by: MyChart Message (if you have MyChart) OR A paper copy in the mail If you have any lab test that is abnormal or we need to change your treatment, we will call you to review the results.   Testing/Procedures: Your physician has recommended that you have a defibrillator inserted. An implantable cardioverter defibrillator (ICD) is a small device that is placed in your chest or, in rare cases, your abdomen. This device uses electrical pulses or shocks to help control life-threatening, irregular heartbeats that could lead the heart to suddenly stop beating (sudden cardiac arrest). Leads are attached to the ICD that goes into your heart. This is done in the hospital and usually requires an overnight stay. Please see the instruction sheet given to you today for more information.    Follow-Up: At [facility], you and your health needs are our priority.  As part of our continuing mission to provide you with exceptional heart care, we have created designated Provider Care Teams.  These Care Teams include your primary Cardiologist (physician) and Advanced Practice Providers (APPs -  Physician Assistants and Nurse Practitioners) who all work together to provide you with the care you need, when you need it.  We recommend signing up for the patient portal called "MyChart".  Sign up information is provided on this After Visit Summary.  MyChart is used to connect with patients for Virtual Visits (Telemedicine).  Patients are able to view lab/test results, encounter notes, upcoming appointments, etc.  Non-urgent messages can be sent to your provider as well.   To learn more about what you can do with MyChart, go to  [email or web address].    Your next appointment:   See instruction letter.   Important Information About Sugar

## 2022-04-16 ENCOUNTER — Telehealth (HOSPITAL_COMMUNITY): Payer: Self-pay | Admitting: Surgery

## 2022-04-16 NOTE — Telephone Encounter (Signed)
I called patient to remind him to perform ordered home sleep study and reinforce that insurance prior authorization was not needed.  I left a message to indicate all of the above.

## 2022-04-16 NOTE — Telephone Encounter (Signed)
-----   Message from Modesta Messing, [place] sent at 03/26/2022  4:00 PM EDT ----- Regarding: RE: home sleep study No pre cert reqd ----- Message ----- From: Crissie Figures, RN Sent: 03/26/2022  11:46 AM EDT To: Modesta Messing, CMA Subject: home sleep study                               Precert?

## 2022-04-23 ENCOUNTER — Encounter (HOSPITAL_BASED_OUTPATIENT_CLINIC_OR_DEPARTMENT_OTHER): Payer: 59 | Admitting: Cardiology

## 2022-04-23 DIAGNOSIS — G4733 Obstructive sleep apnea (adult) (pediatric): Secondary | ICD-10-CM

## 2022-04-24 NOTE — Procedures (Signed)
SLEEP STUDY REPORT Patient Information Study Date: 04/23/2022 Patient Name: David Vang Patient ID: [id number] Birth Date: Oct 23, 1990 Age: 31 Gender: Male BMI: 40.5 (W=258 lb, H=5' 7'') Referring Physician: Allena Katz, NP  TEST DESCRIPTION: Home sleep apnea testing was completed using the WatchPat, a Type 1 device, utilizing peripheral arterial tonometry (PAT), chest movement, actigraphy, pulse oximetry, pulse rate, body position and snore.  AHI was calculated with apnea and hypopnea using valid sleep time as the denominator. RDI includes apneas, hypopneas, and RERAs.  The data acquired and the scoring of sleep and all associated events were performed in accordance with the recommended standards and specifications as outlined in the AASM Manual for the Scoring of Sleep and Associated Events 2.2.0 (2015).  FINDINGS:  1.  Severe Obstructive Sleep Apnea with AHI 31.3/hr.   2.  No Central Sleep Apnea with pAHIc 5.2/hr.  3.  Oxygen desaturations as low as 82%.  4.  Mild snoring was present. O2 sats were < 88% for 1.7 min.  5.  Total sleep time was 7 hrs and 10 min.  6.  30.1% of total sleep time was spent in REM sleep.   7.  Normal sleep onset latency at 20 min.   8.  Normal REM sleep onset latency at 83 min.   9.  Total awakenings were 11.  10. Arrhythmia detection:  None.  DIAGNOSIS:   Severe Obstructive Sleep Apnea (G47.33)  RECOMMENDATIONS:   1.  Clinical correlation of these findings is necessary.  The decision to treat obstructive sleep apnea (OSA) is usually based on the presence of apnea symptoms or the presence of associated medical conditions such as Hypertension, Congestive Heart Failure, Atrial Fibrillation or Obesity.  The most common symptoms of OSA are snoring, gasping for breath while sleeping, daytime sleepiness and fatigue.   2.  Initiating apnea therapy is recommended given the presence of symptoms and/or associated conditions. Recommend proceeding  with one of the following:     a.  Auto-CPAP therapy with a pressure range of 5-20cm H2O.     b.  An oral appliance (OA) that can be obtained from certain dentists with expertise in sleep medicine.  These are primarily of use in non-obese patients with mild and moderate disease.     c.  An ENT consultation which may be useful to look for specific causes of obstruction and possible treatment options.     d.  If patient is intolerant to PAP therapy, consider referral to ENT for evaluation for hypoglossal nerve stimulator.   3.  Close follow-up is necessary to ensure success with CPAP or oral appliance therapy for maximum benefit.  4.  A follow-up oximetry study on CPAP is recommended to assess the adequacy of therapy and determine the need for supplemental oxygen or the potential need for Bi-level therapy.  An arterial blood gas to determine the adequacy of baseline ventilation and oxygenation should also be considered.  5.  Healthy sleep recommendations include:  adequate nightly sleep (normal 7-9 hrs/night), avoidance of caffeine after noon and alcohol near bedtime, and maintaining a sleep environment that is cool, dark and quiet.  6.  Weight loss for overweight patients is recommended.  Even modest amounts of weight loss can significantly improve the severity of sleep apnea.  7.  Snoring recommendations include:  weight loss where appropriate, side sleeping, and avoidance of alcohol before bed.  8.  Operation of motor vehicle should be avoided when sleepy.  Signature:  Armanda Magic, MD; [facility]; Diplomat, American Board of Sleep Medicine Electronically Signed: 04/24/2022

## 2022-05-07 ENCOUNTER — Other Ambulatory Visit: Payer: Self-pay

## 2022-05-07 ENCOUNTER — Ambulatory Visit: Payer: 59 | Attending: Family Medicine

## 2022-05-07 DIAGNOSIS — G4733 Obstructive sleep apnea (adult) (pediatric): Secondary | ICD-10-CM

## 2022-05-17 ENCOUNTER — Other Ambulatory Visit (HOSPITAL_COMMUNITY): Payer: Self-pay

## 2022-05-17 ENCOUNTER — Other Ambulatory Visit (HOSPITAL_COMMUNITY): Payer: Self-pay | Admitting: Cardiology

## 2022-05-17 MED ORDER — SPIRONOLACTONE 25 MG PO TABS
25.0000 mg | ORAL_TABLET | Freq: Every day | ORAL | 11 refills | Status: DC
  Filled 2022-05-17: qty 30, 30d supply, fill #0
  Filled 2022-07-19: qty 30, 30d supply, fill #1
  Filled 2022-10-14: qty 30, 30d supply, fill #2
  Filled 2022-12-26 (×3): qty 30, 30d supply, fill #3
  Filled 2023-02-20: qty 30, 30d supply, fill #4
  Filled 2023-04-07: qty 30, 30d supply, fill #5
  Filled 2023-05-13: qty 30, 30d supply, fill #6

## 2022-05-21 ENCOUNTER — Telehealth: Payer: Self-pay | Admitting: *Deleted

## 2022-05-21 NOTE — Telephone Encounter (Signed)
The patient has been notified of the result. Left detailed message on voicemail and informed patient to call back..Caroleena Paolini [place], CMA   

## 2022-05-21 NOTE — Telephone Encounter (Signed)
-----   Message from Gaynelle Cage, [place] sent at 04/24/2022  3:48 PM EST -----  ----- Message ----- From: Quintella Reichert, MD Sent: 04/24/2022  12:58 PM EST To: Cv Div Sleep Studies  Please let patient know that they have sleep apnea.  Recommend therapeutic CPAP titration for treatment of patient's sleep disordered breathing.  If unable to perform an in lab titration then initiate ResMed auto CPAP from 4 to 15cm H2O with heated humidity and mask of choice and overnight pulse ox on CPAP.

## 2022-05-31 ENCOUNTER — Other Ambulatory Visit (HOSPITAL_COMMUNITY): Payer: Self-pay

## 2022-05-31 ENCOUNTER — Telehealth: Payer: Self-pay | Admitting: Pharmacist

## 2022-05-31 ENCOUNTER — Ambulatory Visit: Payer: 59 | Attending: Cardiology

## 2022-05-31 DIAGNOSIS — I5022 Chronic systolic (congestive) heart failure: Secondary | ICD-10-CM

## 2022-05-31 DIAGNOSIS — I493 Ventricular premature depolarization: Secondary | ICD-10-CM

## 2022-05-31 LAB — CBC WITH DIFFERENTIAL/PLATELET

## 2022-05-31 MED ORDER — SACUBITRIL-VALSARTAN 97-103 MG PO TABS
1.0000 | ORAL_TABLET | Freq: Two times a day (BID) | ORAL | 3 refills | Status: AC
  Filled 2022-05-31: qty 60, 30d supply, fill #0
  Filled 2022-10-14: qty 60, 30d supply, fill #1
  Filled 2022-12-26 (×3): qty 60, 30d supply, fill #2
  Filled 2023-02-20: qty 60, 30d supply, fill #3
  Filled 2023-04-07: qty 60, 30d supply, fill #4
  Filled 2023-05-13: qty 60, 30d supply, fill #5

## 2022-05-31 NOTE — Telephone Encounter (Signed)
Patient walked in requesting samples of Entresto and Jardiance. Patient has Pharmacist, community. I spoke with patient who states his copays are high. He did not get a copay card. I took him back to the office and helped him apply for copay cards for both [place] and Jardiance. Patient was appreciative of the help.

## 2022-06-01 LAB — CBC WITH DIFFERENTIAL/PLATELET
Basophils Absolute: 0 10*3/uL (ref 0.0–0.2)
Basos: 1 %
EOS (ABSOLUTE): 0.1 10*3/uL (ref 0.0–0.4)
Eos: 1 %
Hematocrit: 41.8 % (ref 37.5–51.0)
Hemoglobin: 13.5 g/dL (ref 13.0–17.7)
Immature Grans (Abs): 0 10*3/uL (ref 0.0–0.1)
Immature Granulocytes: 0 %
Lymphocytes Absolute: 2.1 10*3/uL (ref 0.7–3.1)
Lymphs: 25 %
MCH: 27.3 pg (ref 26.6–33.0)
MCHC: 32.3 g/dL (ref 31.5–35.7)
MCV: 84 fL (ref 79–97)
Monocytes Absolute: 0.6 10*3/uL (ref 0.1–0.9)
Monocytes: 8 %
Neutrophils Absolute: 5.5 10*3/uL (ref 1.4–7.0)
Neutrophils: 65 %
Platelets: 337 10*3/uL (ref 150–450)
RBC: 4.95 x10E6/uL (ref 4.14–5.80)
RDW: 13.7 % (ref 11.6–15.4)
WBC: 8.4 10*3/uL (ref 3.4–10.8)

## 2022-06-01 LAB — BASIC METABOLIC PANEL
BUN/Creatinine Ratio: 18 (ref 9–20)
BUN: 16 mg/dL (ref 6–20)
CO2: 24 mmol/L (ref 20–29)
Calcium: 9.2 mg/dL (ref 8.7–10.2)
Chloride: 102 mmol/L (ref 96–106)
Creatinine, Ser: 0.88 mg/dL (ref 0.76–1.27)
Glucose: 149 mg/dL — ABNORMAL HIGH (ref 70–99)
Potassium: 4.5 mmol/L (ref 3.5–5.2)
Sodium: 139 mmol/L (ref 134–144)
eGFR: 118 mL/min/{1.73_m2} (ref >59)

## 2022-06-07 ENCOUNTER — Other Ambulatory Visit (HOSPITAL_COMMUNITY): Payer: Self-pay

## 2022-06-13 NOTE — Pre-Procedure Instructions (Signed)
Attempted to call patient regarding procedure instructions.  Left Instructed patient on the following items: Arrival time 1130 Nothing to eat or drink after midnight No meds AM of procedure Responsible person to drive you home and stay with you for 24 hrs Wash with special soap night before and morning of procedure

## 2022-06-14 ENCOUNTER — Ambulatory Visit (HOSPITAL_COMMUNITY): Payer: 59

## 2022-06-14 ENCOUNTER — Other Ambulatory Visit: Payer: Self-pay

## 2022-06-14 ENCOUNTER — Ambulatory Visit (HOSPITAL_COMMUNITY)
Admission: RE | Disposition: A | Discharge status: Home / Self Care | Payer: 59 | Source: Home / Self Care | Attending: Cardiology

## 2022-06-14 ENCOUNTER — Ambulatory Visit (HOSPITAL_COMMUNITY): Payer: 59 | Admitting: Certified Registered Nurse Anesthetist

## 2022-06-14 ENCOUNTER — Ambulatory Visit (HOSPITAL_BASED_OUTPATIENT_CLINIC_OR_DEPARTMENT_OTHER): Payer: 59 | Admitting: Certified Registered Nurse Anesthetist

## 2022-06-14 ENCOUNTER — Encounter (HOSPITAL_COMMUNITY): Payer: Self-pay | Admitting: Cardiology

## 2022-06-14 ENCOUNTER — Ambulatory Visit (HOSPITAL_COMMUNITY)
Admission: RE | Admit: 2022-06-14 | Discharge: 2022-06-14 | Disposition: A | Discharge status: Home / Self Care | Payer: 59 | Attending: Cardiology | Admitting: Cardiology

## 2022-06-14 DIAGNOSIS — I429 Cardiomyopathy, unspecified: Secondary | ICD-10-CM | POA: Diagnosis not present

## 2022-06-14 DIAGNOSIS — I428 Other cardiomyopathies: Secondary | ICD-10-CM | POA: Diagnosis not present

## 2022-06-14 DIAGNOSIS — I493 Ventricular premature depolarization: Secondary | ICD-10-CM | POA: Insufficient documentation

## 2022-06-14 DIAGNOSIS — I5022 Chronic systolic (congestive) heart failure: Secondary | ICD-10-CM | POA: Diagnosis not present

## 2022-06-14 DIAGNOSIS — Z95810 Presence of automatic (implantable) cardiac defibrillator: Secondary | ICD-10-CM

## 2022-06-14 DIAGNOSIS — Z6841 Body Mass Index (BMI) 40.0 and over, adult: Secondary | ICD-10-CM | POA: Diagnosis not present

## 2022-06-14 HISTORY — PX: SUBQ ICD IMPLANT: EP1223

## 2022-06-14 SURGERY — SUBQ ICD IMPLANT
Anesthesia: General

## 2022-06-14 MED ORDER — SODIUM CHLORIDE 0.9 % IV SOLN: INTRAVENOUS | Status: DC

## 2022-06-14 MED ORDER — SODIUM CHLORIDE 0.9 % IV SOLN
80.0000 mg | INTRAVENOUS | Status: AC
  Administered 2022-06-14: 80 mg

## 2022-06-14 MED ORDER — ONDANSETRON HCL 4 MG/2ML IJ SOLN: 4.0000 mg | Freq: Four times a day (QID) | INTRAMUSCULAR | Status: DC | PRN

## 2022-06-14 MED ORDER — CEFAZOLIN SODIUM-DEXTROSE 2-4 GM/100ML-% IV SOLN
2.0000 g | INTRAVENOUS | Status: AC
  Administered 2022-06-14: 2 g via INTRAVENOUS

## 2022-06-14 MED ORDER — BUPIVACAINE HCL (PF) 0.25 % IJ SOLN
INTRAMUSCULAR | Status: AC
  Filled 2022-06-14: qty 60

## 2022-06-14 MED ORDER — ONDANSETRON HCL 4 MG/2ML IJ SOLN
INTRAMUSCULAR | Status: DC | PRN
  Administered 2022-06-14: 4 mg via INTRAVENOUS

## 2022-06-14 MED ORDER — CEFAZOLIN SODIUM-DEXTROSE 2-4 GM/100ML-% IV SOLN
INTRAVENOUS | Status: AC
  Filled 2022-06-14: qty 100

## 2022-06-14 MED ORDER — ROCURONIUM BROMIDE 10 MG/ML (PF) SYRINGE
PREFILLED_SYRINGE | INTRAVENOUS | Status: DC | PRN
  Administered 2022-06-14: 80 mg via INTRAVENOUS

## 2022-06-14 MED ORDER — HEPARIN (PORCINE) IN NACL 1000-0.9 UT/500ML-% IV SOLN
INTRAVENOUS | Status: AC
  Filled 2022-06-14: qty 500

## 2022-06-14 MED ORDER — FENTANYL CITRATE (PF) 100 MCG/2ML IJ SOLN
INTRAMUSCULAR | Status: DC | PRN
  Administered 2022-06-14: 100 ug via INTRAVENOUS
  Administered 2022-06-14: 50 ug via INTRAVENOUS

## 2022-06-14 MED ORDER — PHENYLEPHRINE 80 MCG/ML (10ML) SYRINGE FOR IV PUSH (FOR BLOOD PRESSURE SUPPORT)
PREFILLED_SYRINGE | INTRAVENOUS | Status: DC | PRN
  Administered 2022-06-14: 80 ug via INTRAVENOUS

## 2022-06-14 MED ORDER — PROPOFOL 10 MG/ML IV BOLUS
INTRAVENOUS | Status: DC | PRN
  Administered 2022-06-14: 20 mg via INTRAVENOUS
  Administered 2022-06-14: 30 mg via INTRAVENOUS
  Administered 2022-06-14: 100 mg via INTRAVENOUS

## 2022-06-14 MED ORDER — ACETAMINOPHEN 325 MG PO TABS
ORAL_TABLET | ORAL | Status: AC
  Filled 2022-06-14: qty 2

## 2022-06-14 MED ORDER — ACETAMINOPHEN 325 MG PO TABS
325.0000 mg | ORAL_TABLET | ORAL | Status: DC | PRN
  Administered 2022-06-14: 650 mg via ORAL

## 2022-06-14 MED ORDER — PHENYLEPHRINE HCL-NACL 20-0.9 MG/250ML-% IV SOLN
INTRAVENOUS | Status: DC | PRN
  Administered 2022-06-14: 25 ug/min via INTRAVENOUS

## 2022-06-14 MED ORDER — SODIUM CHLORIDE 0.9 % IV SOLN
INTRAVENOUS | Status: AC
  Filled 2022-06-14: qty 2

## 2022-06-14 MED ORDER — MIDAZOLAM HCL 2 MG/2ML IJ SOLN
INTRAMUSCULAR | Status: DC | PRN
  Administered 2022-06-14: 2 mg via INTRAVENOUS

## 2022-06-14 MED ORDER — DEXAMETHASONE SODIUM PHOSPHATE 10 MG/ML IJ SOLN
INTRAMUSCULAR | Status: DC | PRN
  Administered 2022-06-14: 10 mg via INTRAVENOUS

## 2022-06-14 MED ORDER — SUGAMMADEX SODIUM 200 MG/2ML IV SOLN
INTRAVENOUS | Status: DC | PRN
  Administered 2022-06-14 (×4): 100 mg via INTRAVENOUS

## 2022-06-14 MED ORDER — CHLORHEXIDINE GLUCONATE 4 % EX LIQD: 4.0000 | Freq: Once | CUTANEOUS | Status: DC

## 2022-06-14 MED ORDER — LIDOCAINE 2% (20 MG/ML) 5 ML SYRINGE
INTRAMUSCULAR | Status: DC | PRN
  Administered 2022-06-14: 60 mg via INTRAVENOUS

## 2022-06-14 MED ORDER — BUPIVACAINE HCL (PF) 0.25 % IJ SOLN
INTRAMUSCULAR | Status: DC | PRN
  Administered 2022-06-14: 60 mL

## 2022-06-14 SURGICAL SUPPLY — 6 items
CABLE SURGICAL S-101-97-12 (CABLE) ×1 IMPLANT
ICD SUBCU MRI EMBLEM A219 (ICD Generator) IMPLANT
LEAD SUBQU EMBLEM 3501 (Pacemaker) IMPLANT
MAT PREVALON FULL STRYKER (MISCELLANEOUS) IMPLANT
PAD DEFIB RADIO PHYSIO CONN (PAD) ×1 IMPLANT
TRAY PACEMAKER INSERTION (PACKS) ×1 IMPLANT

## 2022-06-14 NOTE — Anesthesia Procedure Notes (Signed)
Procedure Name: Intubation Date/Time: 06/14/2022 1:25 PM  Performed by: Harden Mo, CRNAPre-anesthesia Checklist: Patient identified, Emergency Drugs available, Suction available and Patient being monitored Patient Re-evaluated:Patient Re-evaluated prior to induction Oxygen Delivery Method: Circle System Utilized Preoxygenation: Pre-oxygenation with 100% oxygen Induction Type: IV induction Ventilation: Mask ventilation without difficulty Laryngoscope Size: Miller and 2 Grade View: Grade I Tube type: Oral Tube size: 7.5 mm Number of attempts: 1 Airway Equipment and Method: Stylet and Oral airway Placement Confirmation: ETT inserted through vocal cords under direct vision, positive ETCO2 and breath sounds checked- equal and bilateral Secured at: 23 cm Tube secured with: Tape Dental Injury: Teeth and Oropharynx as per pre-operative assessment

## 2022-06-14 NOTE — Discharge Instructions (Signed)
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 After 7 days, you may progress as below.  You should remove your sling 24 hours after your procedure, unless otherwise instructed by your provider.     Friday June 21, 2022  Saturday June 22, 2022 Sunday June 23, 2022 Monday June 24, 2022   Do not lift, push, pull, or carry anything over 10 pounds with the affected arm until 6 weeks (Friday July 26, 2022 ) after your procedure.   You may drive AFTER your wound check, unless you have been told otherwise by your provider.   Ask your healthcare provider when you can go back to work   INCISION/Dressing   If large square, outer bandage is left in place, this can be removed after 24 hours from your procedure. Do not remove steri-strips or glue as below.   Monitor your defibrillator site for redness, swelling, and drainage. Call the device clinic at [phone] if you experience these symptoms or fever/chills.  If your incision is sealed with Steri-strips or staples, you may shower 7 days after your procedure or when told by your provider. Do not remove the steri-strips or let the shower hit directly on your site. You may wash around your site with soap and water.    If you were discharged in a sling, please do not wear this during the day more than 48 hours after your surgery unless otherwise instructed. This may increase the risk of stiffness and soreness in your shoulder.   Avoid lotions, ointments, or perfumes over your incision until it is well-healed.  You may use a hot tub or a pool AFTER your wound check appointment if the incision is completely closed.  Your ICD is designed to protect you from life threatening heart rhythms. Because of this, you may receive a shock.   1 shock with no symptoms:  Call the office during business hours. 1 shock with  symptoms (chest pain, chest pressure, dizziness, lightheadedness, shortness of breath, overall feeling unwell):  Call 911. If you experience 2 or more shocks in 24 hours:  Call 911. If you receive a shock, you should not drive for 6 months per the [place] DMV IF you receive appropriate therapy from your ICD.   ICD Alerts:  Some alerts are vibratory and others beep. These are NOT emergencies. Please call our office to let us know. If this occurs at night or on weekends, it can wait until the next business day. Send a remote transmission.  If your device is capable of reading fluid status (for heart failure), you will be offered monthly monitoring to review this with you.   DEVICE MANAGEMENT Remote monitoring is used to monitor your ICD from home. This monitoring is scheduled every 91 days by our office. It allows [place] to keep an eye on the functioning of your device to ensure it is working properly. You will routinely see your Electrophysiologist annually (more often if necessary).   You should receive your ID card for your new device in 4-8 weeks. Keep this card with you at all times once received. Consider wearing a medical alert bracelet or necklace.  Your ICD  may be MRI compatible. This will be discussed at your next office visit/wound check.  You should avoid contact with strong electric or magnetic fields.   Do not use amateur (ham) radio equipment or  electric (arc) welding torches. MP3 player headphones with magnets should not be used. Some devices are safe to use if held at least 12 inches (30 cm) from your defibrillator. These include power tools, lawn mowers, and speakers. If you are unsure if something is safe to use, ask your health care provider.  When using your cell phone, hold it to the ear that is on the opposite side from the defibrillator. Do not leave your cell phone in a pocket over the defibrillator.  You may safely use electric blankets, heating pads, computers, and microwave  ovens.  Call the office right away if: You have chest pain. You feel more than one shock. You feel more short of breath than you have felt before. You feel more light-headed than you have felt before. Your incision starts to open up.  This information is not intended to replace advice given to you by your health care provider. Make sure you discuss any questions you have with your health care provider.

## 2022-06-14 NOTE — Anesthesia Preprocedure Evaluation (Signed)
Anesthesia Evaluation  Patient identified by MRN, date of birth, ID band Patient awake    Reviewed: Allergy & Precautions, H&P , NPO status , Patient's Chart, lab work & pertinent test results  Airway Mallampati: II  TM Distance: >3 FB Neck ROM: Full    Dental no notable dental hx.    Pulmonary neg pulmonary ROS   breath sounds clear to auscultation + decreased breath sounds      Cardiovascular Normal cardiovascular exam Rhythm:Regular Rate:Normal  H/O ventricular bigeminy  Left Ventricle: Left ventricular ejection fraction, by estimation, is 30  to 35%. The left ventricle has moderately decreased function. The left  ventricle demonstrates global hypokinesis. The average left ventricular  global longitudinal strain is -13.4  %. The global longitudinal strain is abnormal. The left ventricular  internal cavity size was mildly dilated. There is no left ventricular  hypertrophy. Left ventricular diastolic parameters are consistent with  Grade II diastolic dysfunction  (pseudonormalization).   Right Ventricle: The right ventricular size is normal. No increase in  right ventricular wall thickness. Right ventricular systolic function is  mildly reduced. Tricuspid regurgitation signal is inadequate for assessing  PA pressure.   Left Atrium: Left atrial size was normal in size.   Right Atrium: Right atrial size was normal in size.   Pericardium: There is no evidence of pericardial effusion.   Mitral Valve: The mitral valve is normal in structure. Trivial mitral  valve regurgitation. No evidence of mitral valve stenosis.   Tricuspid Valve: The tricuspid valve is normal in structure. Tricuspid  valve regurgitation is not demonstrated.   Aortic Valve: The aortic valve is tricuspid. Aortic valve regurgitation is  not visualized. No aortic stenosis is present.   Pulmonic Valve: The pulmonic valve was normal in structure. Pulmonic  valve  regurgitation is not visualized.   Aorta: The aortic root is normal in size and structure.     Neuro/Psych negative neurological ROS  negative psych ROS   GI/Hepatic negative GI ROS, Neg liver ROS,,,  Endo/Other    Morbid obesity  Renal/GU negative Renal ROS  negative genitourinary   Musculoskeletal negative musculoskeletal ROS (+)    Abdominal   Peds negative pediatric ROS (+)  Hematology negative hematology ROS (+)   Anesthesia Other Findings   Reproductive/Obstetrics negative OB ROS                             Anesthesia Physical Anesthesia Plan  ASA: 4  Anesthesia Plan: General   Post-op Pain Management: Minimal or no pain anticipated   Induction: Intravenous  PONV Risk Score and Plan: 2 and Ondansetron, Dexamethasone and Treatment may vary due to age or medical condition  Airway Management Planned: Oral ETT  Additional Equipment:   Intra-op Plan:   Post-operative Plan: Extubation in OR  Informed Consent: I have reviewed the patients History and Physical, chart, labs and discussed the procedure including the risks, benefits and alternatives for the proposed anesthesia with the patient or authorized representative who has indicated his/her understanding and acceptance.     Dental advisory given  Plan Discussed with: CRNA and Surgeon  Anesthesia Plan Comments:        Anesthesia Quick Evaluation

## 2022-06-14 NOTE — H&P (Signed)
Electrophysiology Office Note:     Date:  06/14/2022    ID:  David Vang, DOB [birth year], MRN [id number]   PCP:  Elsie Stain, MD    [facility] Cardiologist:  None  [facility] Electrophysiologist:  Vickie Epley, MD    Referring MD: Rafael Bihari, FNP    Chief Complaint: CHF   History of Present Illness:     David Vang is a 32 y.o. male who presents for an evaluation of CHF and consideration of ICD implant at the request of Allena Katz, [place]. Their medical history includes chronic systolic congestive heart failure, and morbid obesity.    Previously diagnosed with CAP 09/2020 not improved on abx and prednisone. At the [facility] ED 10/22/20 he met early sepsis criteria. CXR and CT were concerning for bilateral pneumonia and CHF. He was admitted and transferred to [facility] where echocardiogram showed LVEF 20-25% with global HK.  He underwent R/LHC on 10/25/20 with elevated right and left heart filling pressures, moderate pulmonary venous hypertension, low cardiac output but no CAD. He was started on diuretics and GDMT. He was also started on amiodarone and fitted with LifeVest for his PVCs.    He was seen in EP 05/23/2021 where his EKG was negative for PVCs. Amiodarone was stopped.    He saw Allena Katz, FNP on 03/11/2022 where he was doing well with stable weight at home. His EKG showed NSR at 71 bpm. It was noted his father had a history of nonischemic cardiomyopathy and underwent LVAD implant in his 29's. Echo in 2/23 showed that EF was still low in the 30-35% range.  NYHA class I-II symptoms.  He was not volume overloaded on exam. His Wilder Glade was switched to Jardiance 10 mg due to insurance preference. Due to low EF he was referred to EP for ICD > Narrow QRS, not CRT candidate.   Today, he states he is feeling good. He occasionally feels dizzy if he gets up too quickly. This has been somewhat more frequently.  No syncope.   He denies any palpitations, chest  pain, shortness of breath at rest, or peripheral edema. No headaches, syncope, orthopnea, or PND.   Presents for ICD implant today.         Past Medical History:  Diagnosis Date   Morbid obesity [facility])             Past Surgical History:  Procedure Laterality Date   RIGHT/LEFT HEART CATH AND CORONARY ANGIOGRAPHY N/A 10/25/2020    Procedure: RIGHT/LEFT HEART CATH AND CORONARY ANGIOGRAPHY;  Surgeon: Larey Dresser, MD;  Location: [place] CV LAB;  Service: Cardiovascular;  Laterality: N/A;      Current Medications:     Current Meds  Medication Sig   carvedilol (COREG) 25 MG tablet Take 1 tablet (25 mg total) by mouth 2 (two) times daily with a meal.   empagliflozin (JARDIANCE) 10 MG TABS tablet Take 1 tablet (10 mg total) by mouth daily before breakfast.   furosemide (LASIX) 40 MG tablet Take 1 tablet (40 mg total) by mouth every morning AND 0.5 tablets (20 mg total) every evening.   potassium chloride SA (KLOR-CON M) 20 MEQ tablet Take 1 tablet (20 mEq total) by mouth daily.   sacubitril-valsartan (ENTRESTO) 97-103 MG Take 1 tablet by mouth 2 (two) times daily.   sertraline (ZOLOFT) 25 MG tablet Take 1 tablet (25 mg total) by mouth daily.   spironolactone (ALDACTONE) 25 MG tablet Take 1 tablet (25 mg total)  by mouth daily.      Allergies:   Benadryl [diphenhydramine]    Social History         Socioeconomic History   Marital status: Married      Spouse name: Not on file   Number of children: Not on file   Years of education: Not on file   Highest education level: Not on file  Occupational History   Not on file  Tobacco Use   Smoking status: Never   Smokeless tobacco: Never  Substance and Sexual Activity   Alcohol use: Not Currently   Drug use: Never   Sexual activity: Not on file  Other Topics Concern   Not on file  Social History Narrative   Not on file    Social Determinants of Health        Financial Resource Strain: Medium Risk (10/24/2020)    Overall  Financial Resource Strain (CARDIA)     Difficulty of Paying Living Expenses: Somewhat hard  Food Insecurity: No Food Insecurity (10/24/2020)    Hunger Vital Sign     Worried About Running Out of Food in the Last Year: Never true     Ran Out of Food in the Last Year: Never true  Transportation Needs: No Transportation Needs (10/24/2020)    PRAPARE - Armed forces logistics/support/administrative officer (Medical): No     Lack of Transportation (Non-Medical): No  Physical Activity: Not on file  Stress: Not on file  Social Connections: Not on file      Family History: The patient's family history includes Heart failure in his father.   ROS:   Please see the history of present illness.    (+) Dizziness All other systems reviewed and are negative.   EKGs/Labs/Other Studies Reviewed:     The following studies were reviewed today:   Echo  08/07/2021:  1. Left ventricular ejection fraction, by estimation, is 30 to 35%. The  left ventricle has moderately decreased function. The left ventricle  demonstrates global hypokinesis. The left ventricular internal cavity size  was mildly dilated. Left ventricular  diastolic parameters are consistent with Grade II diastolic dysfunction  (pseudonormalization). The average left ventricular global longitudinal  strain is -13.4 %. The global longitudinal strain is abnormal.   2. Right ventricular systolic function is mildly reduced. The right  ventricular size is normal. Tricuspid regurgitation signal is inadequate  for assessing PA pressure.   3. The mitral valve is normal in structure. Trivial mitral valve  regurgitation. No evidence of mitral stenosis.   4. The aortic valve is tricuspid. Aortic valve regurgitation is not  visualized. No aortic stenosis is present.   5. The inferior vena cava is normal in size with greater than 50%  respiratory variability, suggesting right atrial pressure of 3 mmHg.      Recent Labs: 05/03/2021: ALT 22; TSH  3.614 11/21/2021: B Natriuretic Peptide 38.9 03/11/2022: BUN 10; Creatinine, Ser 1.04; Potassium 3.9; Sodium 138    Recent Lipid Panel         Component Value Date/Time    CHOL 159 10/25/2020 0434    TRIG 68 10/25/2020 0434    HDL 30 (L) 10/25/2020 0434    CHOLHDL 5.3 10/25/2020 0434    VLDL 14 10/25/2020 0434    LDLCALC 115 (H) 10/25/2020 0434      Physical Exam:     VS:  BP 124/78   Pulse 88   Ht 5\' 6"  (1.676 m)   Wt 253  lb 12.8 oz (115.1 kg)   SpO2 98%   BMI 40.96 kg/m         Wt Readings from Last 3 Encounters:  04/09/22 253 lb 12.8 oz (115.1 kg)  03/11/22 258 lb 12.8 oz (117.4 kg)  11/21/21 260 lb (117.9 kg)      GEN: Well nourished, well developed in no acute distress.  Obese HEENT: Normal NECK: No JVD; No carotid bruits LYMPHATICS: No lymphadenopathy CARDIAC: RRR, no murmurs, rubs, gallops RESPIRATORY:  Clear to auscultation without rales, wheezing or rhonchi  ABDOMEN: Soft, non-tender, non-distended MUSCULOSKELETAL:  No edema; No deformity  SKIN: Warm and dry NEUROLOGIC:  Alert and oriented x 3 PSYCHIATRIC:  Normal affect          ASSESSMENT:     1. Chronic systolic heart failure (HCC)   2. PVC (premature ventricular contraction)   3. Morbid obesity (HCC)     PLAN:     In order of problems listed above:   #Chronic systolic heart failure NYHA class II symptoms today.  Warm and dry on exam.  On good medical therapy.  Follows with the heart failure clinic. EF is persistently reduced.  Presents today to discuss ICD implant.  I do think this is indicated given his persistently reduced ejection fraction on good medical therapy, family history and gene mutation.   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  He is referred by Prince Rome, NP for risk stratification of sudden death and consideration of ICD implantation.  At this time, he  meets criteria for ICD implantation for primary prevention of sudden death.  I have had a thorough discussion with the patient reviewing options.  The patient and their family (if available) have had opportunities to ask questions and have them answered. The patient and I have decided together through a shared decision making process to proceed with ICD implant at this time.     Risks, benefits, alternatives to ICD implantation were discussed in detail with the patient today. The patient understands that the risks include but are not limited to bleeding, infection, pneumothorax, perforation, tamponade, vascular damage, renal failure, MI, stroke, death, inappropriate shocks, and lead dislodgement and wishes to proceed.  We will therefore schedule device implantation at the next available time.   We will plan for a [employer] subcutaneous ICD.   Presents for ICD today. Procedure reviewed.   Signed, Rossie Muskrat. Lalla Brothers, MD, [facility], [facility] 06/14/2022  Electrophysiology [facility]

## 2022-06-14 NOTE — Transfer of Care (Signed)
Immediate Anesthesia Transfer of Care Note  [name]  Procedure(s) Performed: SUBQ ICD IMPLANT  Patient Location: Cath Lab  Anesthesia Type:General  Level of Consciousness: drowsy and patient cooperative  Airway & Oxygen Therapy: Patient Spontanous Breathing and Patient connected to face mask oxygen  Post-op Assessment: Report given to RN and Post -op Vital signs reviewed and stable  Post vital signs: Reviewed and stable  Last Vitals:  Vitals Value Taken Time  BP 109/66 06/14/22 1502  Temp    Pulse 80 06/14/22 1503  Resp 13 06/14/22 1503  SpO2 100 % 06/14/22 1503  Vitals shown include unvalidated device data.  Last Pain:  Vitals:   06/14/22 1320  TempSrc:   PainSc: 0-No pain         Complications: There were no known notable events for this encounter.

## 2022-06-14 NOTE — Anesthesia Postprocedure Evaluation (Signed)
Anesthesia Post Note  [name]  Procedure(s) Performed: SUBQ ICD IMPLANT     Patient location during evaluation: PACU Anesthesia Type: General Level of consciousness: awake and alert Pain management: pain level controlled Vital Signs Assessment: post-procedure vital signs reviewed and stable Respiratory status: spontaneous breathing, nonlabored ventilation, respiratory function stable and patient connected to nasal cannula oxygen Cardiovascular status: blood pressure returned to baseline and stable Postop Assessment: no apparent nausea or vomiting Anesthetic complications: no  There were no known notable events for this encounter.  Last Vitals:  Vitals:   06/14/22 1515 06/14/22 1525  BP: 125/81 127/67  Pulse: 83 86  Resp: 12 12  Temp:    SpO2: 95% 96%    Last Pain:  Vitals:   06/14/22 1500  TempSrc:   PainSc: 0-No pain                 Kaius Daino S

## 2022-06-17 ENCOUNTER — Encounter (HOSPITAL_COMMUNITY): Payer: Self-pay | Admitting: Cardiology

## 2022-06-17 MED FILL — Heparin Sod (Porcine)-NaCl IV Soln 1000 Unit/500ML-0.9%: INTRAVENOUS | Qty: 500 | Status: AC

## 2022-06-24 ENCOUNTER — Telehealth: Payer: Self-pay | Admitting: Cardiology

## 2022-06-24 ENCOUNTER — Other Ambulatory Visit: Payer: Self-pay

## 2022-06-24 ENCOUNTER — Encounter (HOSPITAL_BASED_OUTPATIENT_CLINIC_OR_DEPARTMENT_OTHER): Payer: Self-pay | Admitting: *Deleted

## 2022-06-24 ENCOUNTER — Emergency Department (HOSPITAL_BASED_OUTPATIENT_CLINIC_OR_DEPARTMENT_OTHER)
Admission: EM | Admit: 2022-06-24 | Discharge: 2022-06-24 | Disposition: A | Discharge status: Home / Self Care | Payer: 59 | Attending: Emergency Medicine | Admitting: Emergency Medicine

## 2022-06-24 DIAGNOSIS — Z79899 Other long term (current) drug therapy: Secondary | ICD-10-CM | POA: Diagnosis not present

## 2022-06-24 DIAGNOSIS — Z5189 Encounter for other specified aftercare: Secondary | ICD-10-CM

## 2022-06-24 DIAGNOSIS — Z4889 Encounter for other specified surgical aftercare: Secondary | ICD-10-CM | POA: Insufficient documentation

## 2022-06-24 HISTORY — DX: Cardiomyopathy, unspecified: I42.9

## 2022-06-24 NOTE — Telephone Encounter (Signed)
I scheduled the patient to come in the office for tomorrow at 8:00 am.

## 2022-06-24 NOTE — ED Provider Notes (Signed)
[facility] Provider Note   CSN: [id number] Arrival date & time: 06/24/22  0315     History  Chief Complaint  Patient presents with   Other    Wound check from ICD placement     Giuseppe Duchemin is a 32 y.o. male.  HPI     This is a 32 year old male who presents with request for wound check.  Patient reports that he had ICD placed on 1/19.  He is due to see his cardiologist on Thursday.  He rolled over in bed tonight and noticed that he was missing a few Steri-Strips and "the wound appeared more open."  No bleeding.  No drainage.  No significant pain.  Home Medications Prior to Admission medications   Medication Sig Start Date End Date Taking? Authorizing Provider  carvedilol (COREG) 25 MG tablet Take 1 tablet (25 mg total) by mouth 2 (two) times daily with a meal. 11/21/21   Larey Dresser, MD  empagliflozin (JARDIANCE) 10 MG TABS tablet Take 1 tablet (10 mg total) by mouth daily before breakfast. 03/11/22   [place], Maricela Bo, FNP  furosemide (LASIX) 40 MG tablet Take 1 tablet (40 mg total) by mouth every morning AND 0.5 tablets (20 mg total) every evening. 09/27/21   Larey Dresser, MD  potassium chloride SA (KLOR-CON M) 20 MEQ tablet Take 1 tablet (20 mEq total) by mouth daily. 11/01/21   Larey Dresser, MD  sacubitril-valsartan (ENTRESTO) 97-103 MG Take 1 tablet by mouth 2 (two) times daily. 05/31/22   Larey Dresser, MD  sertraline (ZOLOFT) 25 MG tablet Take 1 tablet (25 mg total) by mouth daily. 05/31/21   Larey Dresser, MD  spironolactone (ALDACTONE) 25 MG tablet Take 1 tablet (25 mg total) by mouth daily. 05/17/22   Larey Dresser, MD      Allergies    Benadryl [diphenhydramine]    Review of Systems   Review of Systems  Skin:  Positive for wound.  All other systems reviewed and are negative.   Physical Exam Updated Vital Signs BP 128/85 (BP Location: Left Arm)   Pulse 96   Temp 98.3 F (36.8 C) (Oral)   Resp  20   Ht 1.702 m (5\' 7" )   Wt 114.3 kg   SpO2 98%   BMI 39.47 kg/m  Physical Exam Vitals and nursing note reviewed.  Constitutional:      Appearance: He is well-developed. He is obese. He is not ill-appearing.  HENT:     Head: Normocephalic and atraumatic.  Eyes:     Pupils: Pupils are equal, round, and reactive to light.  Cardiovascular:     Rate and Rhythm: Normal rate and regular rhythm.     Comments: Wound noted over anterior chest well-healing, no dehiscence or gaping, wound left lateral chest with Steri-Strips still in place, inferior portion in the wound with approximately 2 mm gape, no active bleeding or drainage, bruising noted inferiorly Pulmonary:     Effort: Pulmonary effort is normal. No respiratory distress.  Abdominal:     Palpations: Abdomen is soft.  Musculoskeletal:     Cervical back: Neck supple.  Lymphadenopathy:     Cervical: No cervical adenopathy.  Skin:    General: Skin is warm and dry.  Neurological:     Mental Status: He is alert and oriented to person, place, and time.  Psychiatric:        Mood and Affect: Mood normal.  ED Results / Procedures / Treatments   Labs (all labs ordered are listed, but only abnormal results are displayed) Labs Reviewed - No data to display  EKG None  Radiology No results found.  Procedures Procedures    Medications Ordered in ED Medications - No data to display  ED Course/ Medical Decision Making/ A&P                             Medical Decision Making  This patient presents to the ED for concern of wound check, this involves an extensive number of treatment options, and is a complaint that carries with it a high risk of complications and morbidity.  I considered the following differential and admission for this acute, potentially life threatening condition.  The differential diagnosis includes infection, bleeding, dehiscence  MDM:    This is a 32 year old male who presents requesting wound check.   Steri-Strips have fallen off and he noted that part of the wound was more open.  He has a 2 mm slight gape in the inferior aspect of the left chest wound.  It does not pull apart.  There is no active bleeding or oozing.  There is no signs or symptoms of infection.  I have reviewed the operative report.  Wound was closed in a layered fashion with subcuticular stitches and absorbable stitches.  I do not observe any true dehiscence.  Wound may be pulling slightly.  I reinforced with Steri-Strips but overall feel that it is well-healing.  Will have him follow-up with his cardiologist.  (Labs, imaging, consults)  Labs: I Ordered, and personally interpreted labs.  The pertinent results include: None  Imaging Studies ordered: I ordered imaging studies including none I independently visualized and interpreted imaging. I agree with the radiologist interpretation  Additional history obtained from chart review.  External records from outside source obtained and reviewed including operative report  Cardiac Monitoring: The patient was maintained on a cardiac monitor.  I personally viewed and interpreted the cardiac monitored which showed an underlying rhythm of: Sinus rhythm  Reevaluation: After the interventions noted above, I reevaluated the patient and found that they have :stayed the same  Social Determinants of Health:  lives independently  Disposition: Discharge  Co morbidities that complicate the patient evaluation  Past Medical History:  Diagnosis Date   Cardiomyopathy ([place])    Morbid obesity ([place])      Medicines No orders of the defined types were placed in this encounter.   I have reviewed the patients home medicines and have made adjustments as needed  Problem List / ED Course: Problem List Items Addressed This Visit   None Visit Diagnoses     Visit for wound check    -  Primary                   Final Clinical Impression(s) / ED Diagnoses Final diagnoses:   Visit for wound check    Rx / DC Orders ED Discharge Orders     None         Merryl Hacker, MD 06/24/22 [phone]

## 2022-06-24 NOTE — Discharge Instructions (Signed)
You were seen today for a wound check.  It appears well-healing and without infection.  You have a small widening of the wound inferior but overall wound appears well-healing.  It was stitched.  Follow-up closely with your cardiologist.

## 2022-06-24 NOTE — ED Notes (Signed)
Dr. Dina Rich at bedside to replace the one steri strip that was pulled off.

## 2022-06-24 NOTE — Telephone Encounter (Signed)
  1. Has your device fired? no  2. Is you device beeping? no  3. Are you experiencing draining or swelling at device site? no  4. Are you calling to see if we received your device transmission? no  5. Have you passed out? no  Patient states one of the strips came off and the incision reopened. He says he went to the ED and they told him to call the office.   Please route to [place]

## 2022-06-24 NOTE — ED Notes (Signed)
Pt agreeable with d/c plan as discussed by provider- this nurse has verbally reinforced d/c instructions and provided pt with written copy - pt acknowledges verbal understanding and denies any addl questions, concerns, needs.  Pt ambulatory independently at d/c with steady gait; vitals stable; no distress.  Accompanied by visitor at d/c

## 2022-06-24 NOTE — Telephone Encounter (Signed)
According to ER note, open wound area was reinforced with steri strips. Wound is not open at present, appeared good wound healing per ER.  I have LM for patient to call back so we can follow up further.

## 2022-06-24 NOTE — ED Triage Notes (Addendum)
Pt states that he had an ICD placed on 1-19. Tonight one of his steri strips covering the wound got caught and was pulled off. Pt is here for a wound check. No bleeding noted to wound under his left axillary area. Skin is pulled away a little.

## 2022-06-25 ENCOUNTER — Ambulatory Visit: Payer: 59 | Attending: Internal Medicine

## 2022-06-25 DIAGNOSIS — Z95810 Presence of automatic (implantable) cardiac defibrillator: Secondary | ICD-10-CM

## 2022-06-26 NOTE — Progress Notes (Signed)
Patient in today for follow up from ER for wound check of device site.  06/14/22 - patient had subcutaneous ICD placed to left lower lateral chest below breast area.  Over the weekend patient noted gaping opening at distal end of incision site after steri-strips stuck to the bed sheet when pulling it off.  (See picture below)  ER evaluated wound, determined healing WNL and reinforced incision site with steri-strips. No signs of infection, drainage or active bleeding.  Today wound appears WNL with steri-strips still in place and no signs of open area at incision line. Strips are clean and dry without any signs of drainage and no s/s of infection at wound site.  Dr. Lovena Le evaluated wound and agrees with normal wound healing and to keep follow up with the device clinic on 06/28/22 for wound re-check and device interrogation.    Patient instructed to continue to keep the area dry and continue to monitor for signs of infection.  He was given device clinic number to call if any concerns.

## 2022-06-27 ENCOUNTER — Ambulatory Visit: Payer: 59

## 2022-06-28 ENCOUNTER — Ambulatory Visit: Payer: 59 | Attending: Interventional Cardiology

## 2022-06-28 DIAGNOSIS — I5022 Chronic systolic (congestive) heart failure: Secondary | ICD-10-CM | POA: Diagnosis not present

## 2022-06-28 LAB — CUP PACEART INCLINIC DEVICE CHECK
Date Time Interrogation Session: 20240202161723
Implantable Lead Connection Status: 753985
Implantable Lead Implant Date: 20240119
Implantable Lead Location: 753862
Implantable Lead Model: 3501
Implantable Lead Serial Number: 248506
Implantable Pulse Generator Implant Date: 20240119
Pulse Gen Serial Number: 197105

## 2022-06-28 NOTE — Progress Notes (Signed)
Wound check appointment. Steri-strips removed. Wound without redness or edema. Incision edges approximated with a small raised edge at the lower section of incision.  3 steristrips placed to ensure no disruption of incision.  Follow up wound check scheduled.   Patient educated about wound care, lifting restrictions, shock plan. ROV in 3 months with implanting physician. Subcutaneous ICD check in clinic. 0 untreated episodes; 0 treated episodes; 0 shocks delivered. Electrode impedance status okay. No programming changes. Remaining longevity to ERI 100%.

## 2022-06-28 NOTE — Patient Instructions (Signed)

## 2022-07-03 ENCOUNTER — Ambulatory Visit: Payer: 59

## 2022-07-19 ENCOUNTER — Other Ambulatory Visit (HOSPITAL_COMMUNITY): Payer: Self-pay | Admitting: Cardiology

## 2022-07-19 ENCOUNTER — Other Ambulatory Visit (HOSPITAL_COMMUNITY): Payer: Self-pay

## 2022-07-22 ENCOUNTER — Other Ambulatory Visit (HOSPITAL_COMMUNITY): Payer: Self-pay

## 2022-08-01 ENCOUNTER — Other Ambulatory Visit (HOSPITAL_COMMUNITY): Payer: Self-pay

## 2022-08-05 ENCOUNTER — Other Ambulatory Visit (HOSPITAL_COMMUNITY): Payer: Self-pay | Admitting: Cardiology

## 2022-08-05 ENCOUNTER — Other Ambulatory Visit (HOSPITAL_COMMUNITY): Payer: Self-pay

## 2022-08-05 MED ORDER — FUROSEMIDE 40 MG PO TABS
ORAL_TABLET | ORAL | 5 refills | Status: DC
  Filled 2022-08-05: qty 45, 30d supply, fill #0
  Filled 2022-10-14: qty 45, 30d supply, fill #1
  Filled 2022-12-26 (×3): qty 45, 30d supply, fill #2
  Filled 2023-02-20: qty 45, 30d supply, fill #3
  Filled 2023-04-07: qty 45, 30d supply, fill #4
  Filled 2023-05-13 (×2): qty 45, 30d supply, fill #5

## 2022-09-02 ENCOUNTER — Other Ambulatory Visit (HOSPITAL_COMMUNITY): Payer: Self-pay

## 2022-09-16 ENCOUNTER — Ambulatory Visit (INDEPENDENT_AMBULATORY_CARE_PROVIDER_SITE_OTHER): Payer: 59

## 2022-09-16 DIAGNOSIS — I5022 Chronic systolic (congestive) heart failure: Secondary | ICD-10-CM | POA: Diagnosis not present

## 2022-09-17 LAB — CUP PACEART REMOTE DEVICE CHECK
Battery Remaining Percentage: 98 %
Date Time Interrogation Session: 20240422092000
Implantable Lead Connection Status: 753985
Implantable Lead Implant Date: 20240119
Implantable Lead Location: 753862
Implantable Lead Model: 3501
Implantable Lead Serial Number: 248506
Implantable Pulse Generator Implant Date: 20240119
Pulse Gen Serial Number: 197105

## 2022-10-08 ENCOUNTER — Ambulatory Visit: Payer: 59 | Admitting: Cardiology

## 2022-10-08 NOTE — Progress Notes (Deleted)
  Electrophysiology Office Follow up Visit Note:    Date:  10/08/2022   ID:  David Vang, DOB June 17, 1990, MRN [id number]  PCP:  David Frisk, MD  [facility] Cardiologist:  None  [facility] Electrophysiologist:  David Prude, MD    Interval History:    David Vang is a 32 y.o. male who presents for a follow up visit.   He had a subcutaneous ICD implanted June 14, 2022.  Remote interrogations after implant have shown stable device function.       Past medical, surgical, social and family history were reviewed.  ROS:   Please see the history of present illness.    All other systems reviewed and are negative.  EKGs/Labs/Other Studies Reviewed:    The following studies were reviewed today:  Oct 08, 2022 in clinic device interrogation personally reviewed ***    Physical Exam:    VS:  There were no vitals taken for this visit.    Wt Readings from Last 3 Encounters:  06/24/22 252 lb (114.3 kg)  06/14/22 252 lb (114.3 kg)  04/09/22 253 lb 12.8 oz (115.1 kg)     GEN: *** Well nourished, well developed in no acute distress CARDIAC: ***RRR, no murmurs, rubs, gallops RESPIRATORY:  Clear to auscultation without rales, wheezing or rhonchi       ASSESSMENT:    1. Chronic systolic heart failure (HCC)   2. ICD (implantable cardioverter-defibrillator) in place   3. Morbid obesity (HCC)    PLAN:    In order of problems listed above:  #Chronic systolic heart failure #ICD in situ Device functioning appropriately.  Continue remote monitoring Follows with Dr. Shirlee Vang but is overdue for follow-up appointment***  Continue Coreg, Jardiance, Lasix, Entresto, Aldactone.  #Morbid obesity Weight loss encouraged  Follow-up 1 year with the EP APP    Signed, David Dunn, MD, [facility], [facility] 10/08/2022 5:17 AM    Electrophysiology [facility]

## 2022-10-17 ENCOUNTER — Other Ambulatory Visit (HOSPITAL_COMMUNITY): Payer: Self-pay

## 2022-10-23 ENCOUNTER — Other Ambulatory Visit (HOSPITAL_COMMUNITY): Payer: Self-pay

## 2022-10-23 NOTE — Progress Notes (Signed)
Remote ICD transmission.   

## 2022-11-18 NOTE — Progress Notes (Unsigned)
  Electrophysiology Office Note:   ID:  David Vang, DOB 15-Mar-1991, MRN [id number]  Primary Cardiologist: None Electrophysiologist: Lanier Prude, MD  {Click to update primary MD,subspecialty MD or APP then REFRESH:1}    History of Present Illness:   David Vang is a 32 y.o. male with h/o PVCs, Obesity, and HFrEF seen today for routine electrophysiology followup.   S/p BSx S-ICD 05/2022  Since last being seen in our clinic the patient reports doing very well.  he denies chest pain, palpitations, dyspnea, PND, orthopnea, nausea, vomiting, dizziness, syncope, edema, weight gain, or early satiety.   Review of systems complete and found to be negative unless listed in HPI.   Device History: Environmental manager S-ICD ICD implanted 06/14/2022 for CHF / primary prevention  Studies Reviewed:    ICD Interrogation-  reviewed in detail today,  See PACEART report.  EKG is not ordered today. EKG from 06/14/2022 reviewed which showed NSR  at 73 bpm   Physical Exam:   VS:  BP 122/70   Pulse 70   Ht 5\' 7"  (1.702 m)   Wt 244 lb 12.8 oz (111 kg)   SpO2 98%   BMI 38.34 kg/m    Wt Readings from Last 3 Encounters:  11/19/22 244 lb 12.8 oz (111 kg)  06/24/22 252 lb (114.3 kg)  06/14/22 252 lb (114.3 kg)     GEN: Well nourished, well developed in no acute distress NECK: No JVD; No carotid bruits CARDIAC: Regular rate and rhythm, no murmurs, rubs, gallops RESPIRATORY:  Clear to auscultation without rales, wheezing or rhonchi  ABDOMEN: Soft, non-tender, non-distended EXTREMITIES:  No edema; No deformity   ASSESSMENT AND PLAN:    Chronic systolic dysfunction s/p Environmental manager S-ICD  euvolemic today Stable on an appropriate medical regimen Normal ICD function See Pace Art report No changes today  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   Obesity Body mass index is 38.34 kg/m.  Encouraged lifestyle modification   Disposition:   Follow  up with Dr. Lalla Brothers in 6 months   Signed, Graciella Freer, PA-C

## 2022-11-19 ENCOUNTER — Encounter: Payer: Self-pay | Admitting: Student

## 2022-11-19 ENCOUNTER — Ambulatory Visit: Payer: 59 | Attending: Cardiology | Admitting: Student

## 2022-11-19 VITALS — BP 122/70 | HR 70 | Ht 67.0 in | Wt 244.8 lb

## 2022-11-19 DIAGNOSIS — I493 Ventricular premature depolarization: Secondary | ICD-10-CM

## 2022-11-19 DIAGNOSIS — I10 Essential (primary) hypertension: Secondary | ICD-10-CM | POA: Diagnosis not present

## 2022-11-19 DIAGNOSIS — I5022 Chronic systolic (congestive) heart failure: Secondary | ICD-10-CM | POA: Diagnosis not present

## 2022-11-19 DIAGNOSIS — Z95810 Presence of automatic (implantable) cardiac defibrillator: Secondary | ICD-10-CM

## 2022-11-19 NOTE — Patient Instructions (Signed)
Medication Instructions:  Your physician recommends that you continue on your current medications as directed. Please refer to the Current Medication list given to you today.  *If you need a refill on your cardiac medications before your next appointment, please call your pharmacy*  Lab Work: BMET-TODAY If you have labs (blood work) drawn today and your tests are completely normal, you will receive your results only by: MyChart Message (if you have MyChart) OR A paper copy in the mail If you have any lab test that is abnormal or we need to change your treatment, we will call you to review the results.  Follow-Up: At [facility], you and your health needs are our priority.  As part of our continuing mission to provide you with exceptional heart care, we have created designated Provider Care Teams.  These Care Teams include your primary Cardiologist (physician) and Advanced Practice Providers (APPs -  Physician Assistants and Nurse Practitioners) who all work together to provide you with the care you need, when you need it.  Your next appointment:   6 month(s)  Provider:   Steffanie Dunn, MD

## 2022-11-20 LAB — CUP PACEART INCLINIC DEVICE CHECK
Date Time Interrogation Session: 20240626075417
Implantable Lead Connection Status: 753985
Implantable Lead Implant Date: 20240119
Implantable Lead Location: 753862
Implantable Lead Model: 3501
Implantable Lead Serial Number: 248506
Implantable Pulse Generator Implant Date: 20240119
Pulse Gen Serial Number: 197105

## 2022-11-20 LAB — BASIC METABOLIC PANEL
BUN/Creatinine Ratio: 13 (ref 9–20)
BUN: 13 mg/dL (ref 6–20)
CO2: 26 mmol/L (ref 20–29)
Calcium: 9.8 mg/dL (ref 8.7–10.2)
Chloride: 99 mmol/L (ref 96–106)
Creatinine, Ser: 0.99 mg/dL (ref 0.76–1.27)
Glucose: 130 mg/dL — ABNORMAL HIGH (ref 70–99)
Potassium: 4.6 mmol/L (ref 3.5–5.2)
Sodium: 140 mmol/L (ref 134–144)
eGFR: 104 mL/min/{1.73_m2} (ref >59)

## 2022-12-26 ENCOUNTER — Other Ambulatory Visit (HOSPITAL_COMMUNITY): Payer: Self-pay

## 2022-12-26 ENCOUNTER — Other Ambulatory Visit (HOSPITAL_COMMUNITY): Payer: Self-pay | Admitting: Cardiology

## 2022-12-26 ENCOUNTER — Other Ambulatory Visit: Payer: Self-pay

## 2022-12-26 MED ORDER — CARVEDILOL 25 MG PO TABS
25.0000 mg | ORAL_TABLET | Freq: Two times a day (BID) | ORAL | 3 refills | Status: DC
  Filled 2022-12-26: qty 60, 30d supply, fill #0
  Filled 2023-02-20: qty 60, 30d supply, fill #1
  Filled 2023-04-07: qty 60, 30d supply, fill #2
  Filled 2023-05-13: qty 60, 30d supply, fill #3
  Filled 2023-07-27: qty 60, 30d supply, fill #4
  Filled 2023-10-14: qty 60, 30d supply, fill #5

## 2022-12-26 MED ORDER — POTASSIUM CHLORIDE CRYS ER 20 MEQ PO TBCR
20.0000 meq | EXTENDED_RELEASE_TABLET | Freq: Every day | ORAL | 3 refills | Status: DC
  Filled 2022-12-26: qty 30, 30d supply, fill #0
  Filled 2023-02-20: qty 30, 30d supply, fill #1
  Filled 2023-04-07: qty 30, 30d supply, fill #2
  Filled 2023-05-07 (×2): qty 30, 30d supply, fill #3
  Filled 2023-07-27: qty 30, 30d supply, fill #4
  Filled 2023-10-14: qty 30, 30d supply, fill #5

## 2023-02-20 IMAGING — MR MR CARD MORPHOLOGY WO/W CM
45 of 48 series · 45 of 48 positions shown · IV contrast (gadavist)
Comparison: none

CLINICAL DATA: Cardiomyopathy of uncertain etiology

EXAM:
CARDIAC MRI
TECHNIQUE: The patient was scanned on a 1.5 Tesla GE magnet. A dedicated
cardiac coil was used. Functional imaging was done using Fiesta
sequences. [DATE], and 4 chamber views were done to assess for RWMA's.
Modified Arnaud rule using a short axis stack was used to
calculate an ejection fraction on a dedicated work station using
Circle software. The patient received 8 cc of Gadavist. After 10
minutes inversion recovery sequences were used to assess for
infiltration and scar tissue.

[Series 4: [id number]_bh · axial · 8.0mm · 1.56mm/px · 1 of 16 slices shown]
[im 1/16]
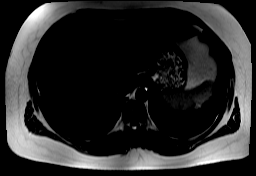

[Series 8: bSSFP · oblique · 8.0mm · 1.61mm/px · 1 of 25 slices shown (1 of 22)]
[im 1/25]
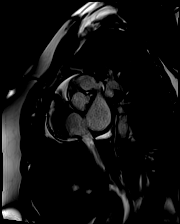

[Series 9: bSSFP · oblique · 8.0mm · 1.61mm/px · 1 of 25 slices shown (2 of 22)]
[im 1/25]
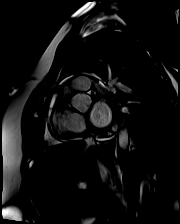

[Series 10: bSSFP · oblique · 8.0mm · 1.61mm/px · 1 of 25 slices shown (3 of 22)]
[im 1/25]
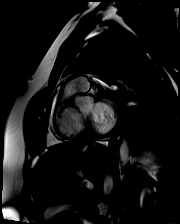

[Series 11: bSSFP · oblique · 8.0mm · 1.61mm/px · 1 of 25 slices shown (4 of 22)]
[im 1/25]
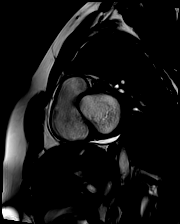

[Series 12: bSSFP · oblique · 8.0mm · 1.61mm/px · 1 of 25 slices shown (5 of 22)]
[im 1/25]
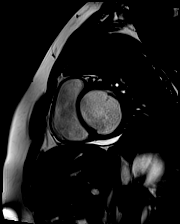

[Series 13: bSSFP · oblique · 8.0mm · 1.61mm/px · 1 of 25 slices shown (6 of 22)]
[im 1/25]
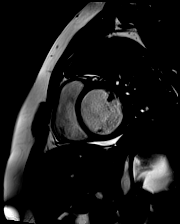

[Series 14: bSSFP · oblique · 8.0mm · 1.61mm/px · 1 of 25 slices shown (7 of 22)]
[im 1/25]
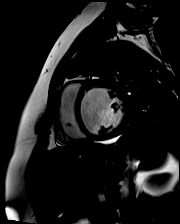

[Series 15: bSSFP · oblique · 8.0mm · 1.61mm/px · 1 of 25 slices shown (8 of 22)]
[im 1/25]
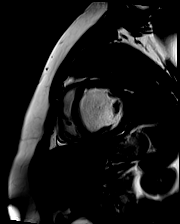

[Series 16: bSSFP · oblique · 8.0mm · 1.61mm/px · 1 of 25 slices shown (9 of 22)]
[im 1/25]
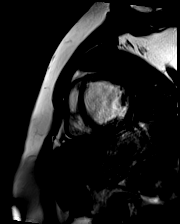

[Series 17: bSSFP · oblique · 8.0mm · 1.61mm/px · 1 of 25 slices shown (10 of 22)]
[im 1/25]
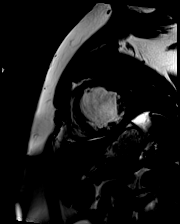

[Series 18: bSSFP · oblique · 8.0mm · 1.61mm/px · 1 of 25 slices shown (11 of 22)]
[im 1/25]
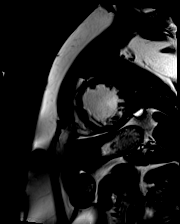

[Series 19: bSSFP · oblique · 8.0mm · 1.61mm/px · 1 of 25 slices shown (12 of 22)]
[im 1/25]
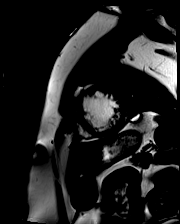

[Series 20: bSSFP · oblique · 8.0mm · 1.61mm/px · 1 of 25 slices shown (13 of 22)]
[im 1/25]
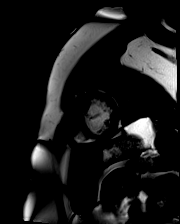

[Series 21: bSSFP · oblique · 8.0mm · 1.61mm/px · 1 of 25 slices shown (14 of 22)]
[im 1/25]
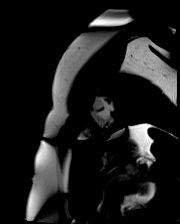

[Series 22: bSSFP · oblique · 8.0mm · 1.61mm/px · 1 of 25 slices shown (15 of 22)]
[im 1/25]
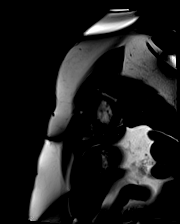

[Series 23: bSSFP · oblique · 8.0mm · 1.61mm/px · 1 of 25 slices shown (16 of 22)]
[im 1/25]
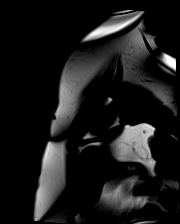

[Series 24: bSSFP · oblique · 8.0mm · 1.61mm/px · 1 of 25 slices shown (17 of 22)]
[im 1/25]
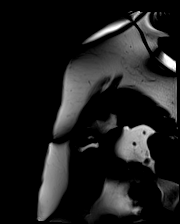

[Series 25: bSSFP · oblique · 8.0mm · 1.61mm/px · 1 of 25 slices shown (18 of 22)]
[im 1/25]
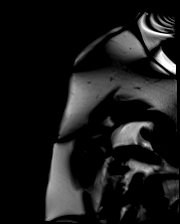

[Series 26: cine_trufi_cs_rt_short axis · oblique · 8.0mm · 1.92mm/px · 1 of 13 slices shown (1 of 20)]
[im 1/13]
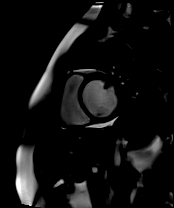

[Series 26: cine_trufi_cs_rt_short axis · oblique · 8.0mm · 1.92mm/px · 1 of 13 slices shown (2 of 20)]
[im 1/13]
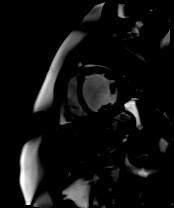

[Series 26: cine_trufi_cs_rt_short axis · oblique · 8.0mm · 1.92mm/px · 1 of 13 slices shown (3 of 20)]
[im 1/13]
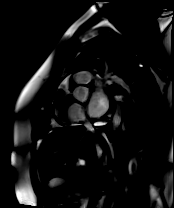

[Series 26: cine_trufi_cs_rt_short axis · oblique · 8.0mm · 1.92mm/px · 1 of 13 slices shown (4 of 20)]
[im 1/13]
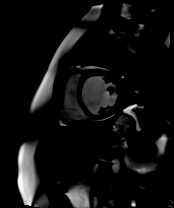

[Series 26: cine_trufi_cs_rt_short axis · oblique · 8.0mm · 1.92mm/px · 1 of 13 slices shown (5 of 20)]
[im 1/13]
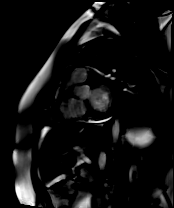

[Series 26: cine_trufi_cs_rt_short axis · oblique · 8.0mm · 1.92mm/px · 1 of 13 slices shown (6 of 20)]
[im 1/13]
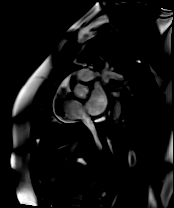

[Series 26: cine_trufi_cs_rt_short axis · oblique · 8.0mm · 1.92mm/px · 1 of 13 slices shown (7 of 20)]
[im 1/13]
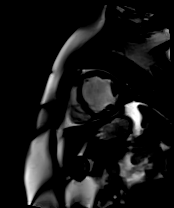

[Series 26: cine_trufi_cs_rt_short axis · oblique · 8.0mm · 1.92mm/px · 1 of 13 slices shown (8 of 20)]
[im 1/13]
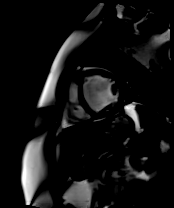

[Series 26: cine_trufi_cs_rt_short axis · oblique · 8.0mm · 1.92mm/px · 1 of 13 slices shown (9 of 20)]
[im 1/13]
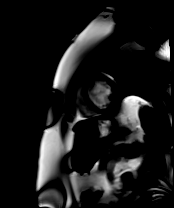

[Series 26: cine_trufi_cs_rt_short axis · oblique · 8.0mm · 1.92mm/px · 1 of 13 slices shown (10 of 20)]
[im 1/13]
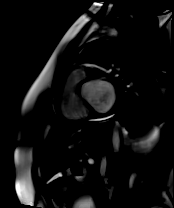

[Series 26: cine_trufi_cs_rt_short axis · oblique · 8.0mm · 1.92mm/px · 1 of 13 slices shown (11 of 20)]
[im 1/13]
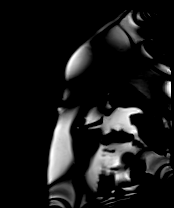

[Series 26: cine_trufi_cs_rt_short axis · oblique · 8.0mm · 1.92mm/px · 1 of 13 slices shown (12 of 20)]
[im 1/13]
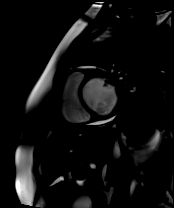

[Series 26: cine_trufi_cs_rt_short axis · oblique · 8.0mm · 1.92mm/px · 1 of 13 slices shown (13 of 20)]
[im 1/13]
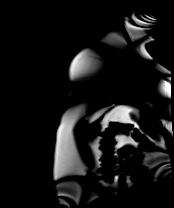

[Series 26: cine_trufi_cs_rt_short axis · oblique · 8.0mm · 1.92mm/px · 1 of 13 slices shown (14 of 20)]
[im 1/13]
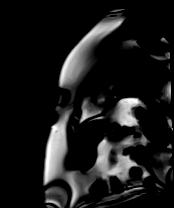

[Series 26: cine_trufi_cs_rt_short axis · oblique · 8.0mm · 1.92mm/px · 1 of 13 slices shown (15 of 20)]
[im 1/13]
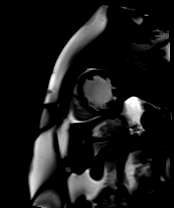

[Series 26: cine_trufi_cs_rt_short axis · oblique · 8.0mm · 1.92mm/px · 1 of 13 slices shown (16 of 20)]
[im 1/13]
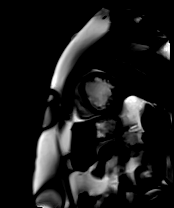

[Series 26: cine_trufi_cs_rt_short axis · oblique · 8.0mm · 1.92mm/px · 1 of 13 slices shown (17 of 20)]
[im 1/13]
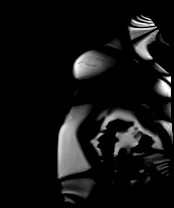

[Series 26: cine_trufi_cs_rt_short axis · oblique · 8.0mm · 1.92mm/px · 1 of 13 slices shown (18 of 20)]
[im 1/13]
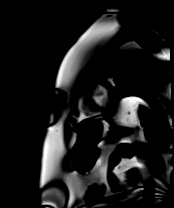

[Series 26: cine_trufi_cs_rt_short axis · oblique · 8.0mm · 1.92mm/px · 1 of 13 slices shown (19 of 20)]
[im 1/13]
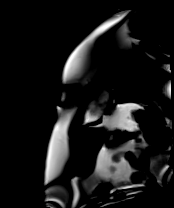

[Series 26: cine_trufi_cs_rt_short axis · oblique · 8.0mm · 1.92mm/px · 1 of 13 slices shown (20 of 20)]
[im 1/13]
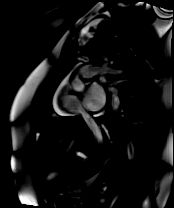

[Series 27: bSSFP · oblique · 6.0mm · 1.41mm/px · 1 of 25 slices shown (19 of 22)]
[im 1/25]
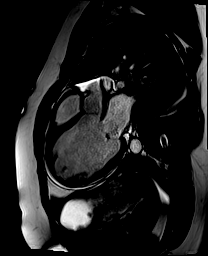

[Series 28: bSSFP · oblique · 6.0mm · 1.41mm/px · 1 of 25 slices shown (20 of 22)]
[im 1/25]
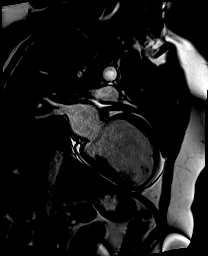

[Series 29: bSSFP · oblique · 6.0mm · 1.41mm/px · 1 of 25 slices shown (21 of 22)]
[im 1/25]
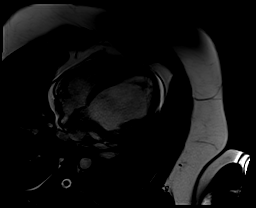

[Series 30: STIR · oblique · 8.0mm · 2.04mm/px · 1 of 18 slices shown]
[im 1/18]
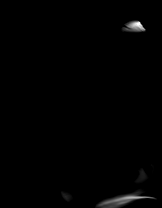

[Series 31: bSSFP · axial · 6.0mm · 1.41mm/px · 1 of 25 slices shown (22 of 22)]
[im 1/25]
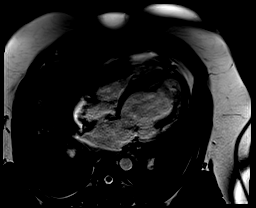

[Series 32: (id)_long_t1 · oblique · 8.0mm · 1.56mm/px · 1 of 24 slices shown]
[im 1/24]
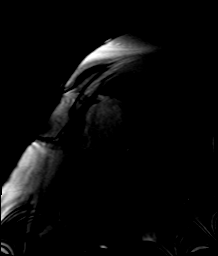

[45 of 48 positions shown; findings below may reference images not displayed]

FINDINGS: Extensive airspace disease right lung and left lung base.

Small pericardial effusion. Severe left ventricular dilation with
normal wall thickness. No evidence for LV noncompaction. Diffuse LV
hypokinesis, worse in the septum. LV EF 21%. Mildly dilated right
ventricle with mildlly decreased systolic function, EF 35%. Moderate
left atrial enlargement. Mild right atrial enlargement. Mitral
regurgitation appears moderate and central (functional). Trileaflet
aortic valve with no stenosis or regurgitation.

On delayed enhancement imaging, there is a small area of late
gadolinium enhancement (LGE) in the mid-wall at the inferior RV
insertion site.

Measurements:

LVEDV 349 mL

LVSV 73 mL
LVEF 21%

RVEDV 210 mL
RVSV 73 mL
RVEF 35%
IMPRESSION: 1.  Extensive airspace disease R>L, concern for PNA.

2. Severe LV dilation with diffuse hypokinesis worse in the septum,
EF 21%.

3.  Mildly dilated RV with EF 35%.

4. RV insertion site LGE, this is nonspecific and suggesive of RV
pressure/volume overload.

Deybin Boj

## 2023-03-14 ENCOUNTER — Emergency Department (HOSPITAL_COMMUNITY): Payer: 59

## 2023-03-14 ENCOUNTER — Telehealth: Payer: Self-pay | Admitting: Cardiology

## 2023-03-14 ENCOUNTER — Other Ambulatory Visit: Payer: Self-pay

## 2023-03-14 ENCOUNTER — Inpatient Hospital Stay (HOSPITAL_COMMUNITY)
Admission: EM | Admit: 2023-03-14 | Discharge: 2023-03-18 | DRG: 309 | Disposition: A | Discharge status: Home / Self Care | Payer: 59 | Attending: Cardiology | Admitting: Cardiology

## 2023-03-14 ENCOUNTER — Encounter (HOSPITAL_COMMUNITY): Payer: Self-pay

## 2023-03-14 DIAGNOSIS — G4733 Obstructive sleep apnea (adult) (pediatric): Secondary | ICD-10-CM | POA: Diagnosis present

## 2023-03-14 DIAGNOSIS — Z6837 Body mass index (BMI) 37.0-37.9, adult: Secondary | ICD-10-CM

## 2023-03-14 DIAGNOSIS — I4901 Ventricular fibrillation: Secondary | ICD-10-CM

## 2023-03-14 DIAGNOSIS — Z888 Allergy status to other drugs, medicaments and biological substances status: Secondary | ICD-10-CM

## 2023-03-14 DIAGNOSIS — I5022 Chronic systolic (congestive) heart failure: Secondary | ICD-10-CM | POA: Diagnosis present

## 2023-03-14 DIAGNOSIS — I4729 Other ventricular tachycardia: Secondary | ICD-10-CM | POA: Diagnosis present

## 2023-03-14 DIAGNOSIS — Z79899 Other long term (current) drug therapy: Secondary | ICD-10-CM | POA: Diagnosis not present

## 2023-03-14 DIAGNOSIS — Z7984 Long term (current) use of oral hypoglycemic drugs: Secondary | ICD-10-CM | POA: Diagnosis not present

## 2023-03-14 DIAGNOSIS — Z95810 Presence of automatic (implantable) cardiac defibrillator: Secondary | ICD-10-CM

## 2023-03-14 DIAGNOSIS — E669 Obesity, unspecified: Secondary | ICD-10-CM | POA: Diagnosis present

## 2023-03-14 DIAGNOSIS — I420 Dilated cardiomyopathy: Secondary | ICD-10-CM | POA: Diagnosis present

## 2023-03-14 DIAGNOSIS — Z5986 Financial insecurity: Secondary | ICD-10-CM | POA: Diagnosis not present

## 2023-03-14 DIAGNOSIS — I493 Ventricular premature depolarization: Secondary | ICD-10-CM | POA: Diagnosis present

## 2023-03-14 DIAGNOSIS — F32A Depression, unspecified: Secondary | ICD-10-CM | POA: Diagnosis present

## 2023-03-14 DIAGNOSIS — I472 Ventricular tachycardia, unspecified: Secondary | ICD-10-CM | POA: Diagnosis present

## 2023-03-14 DIAGNOSIS — Z4502 Encounter for adjustment and management of automatic implantable cardiac defibrillator: Principal | ICD-10-CM

## 2023-03-14 LAB — BASIC METABOLIC PANEL
Anion gap: 15 (ref 5–15)
BUN: 16 mg/dL (ref 6–20)
CO2: 21 mmol/L — ABNORMAL LOW (ref 22–32)
Calcium: 9.4 mg/dL (ref 8.9–10.3)
Chloride: 101 mmol/L (ref 98–111)
Creatinine, Ser: 1.04 mg/dL (ref 0.61–1.24)
GFR, Estimated: >60 mL/min (ref >60)
Glucose, Bld: 106 mg/dL — ABNORMAL HIGH (ref 70–99)
Potassium: 3.9 mmol/L (ref 3.5–5.1)
Sodium: 137 mmol/L (ref 135–145)

## 2023-03-14 LAB — CBC WITH DIFFERENTIAL/PLATELET
Abs Immature Granulocytes: 0.04 10*3/uL (ref 0.00–0.07)
Basophils Absolute: 0 10*3/uL (ref 0.0–0.1)
Basophils Relative: 0 %
Eosinophils Absolute: 0.1 10*3/uL (ref 0.0–0.5)
Eosinophils Relative: 1 %
HCT: 43.9 % (ref 39.0–52.0)
Hemoglobin: 14.4 g/dL (ref 13.0–17.0)
Immature Granulocytes: 0 %
Lymphocytes Relative: 17 %
Lymphs Abs: 1.7 10*3/uL (ref 0.7–4.0)
MCH: 27.5 pg (ref 26.0–34.0)
MCHC: 32.8 g/dL (ref 30.0–36.0)
MCV: 83.8 fL (ref 80.0–100.0)
Monocytes Absolute: 0.7 10*3/uL (ref 0.1–1.0)
Monocytes Relative: 6 %
Neutro Abs: 8 10*3/uL — ABNORMAL HIGH (ref 1.7–7.7)
Neutrophils Relative %: 76 %
Platelets: 325 10*3/uL (ref 150–400)
RBC: 5.24 MIL/uL (ref 4.22–5.81)
RDW: 13.2 % (ref 11.5–15.5)
WBC: 10.5 10*3/uL (ref 4.0–10.5)
nRBC: 0 % (ref 0.0–0.2)

## 2023-03-14 LAB — MRSA NEXT GEN BY PCR, NASAL: MRSA by PCR Next Gen: NOT DETECTED

## 2023-03-14 LAB — PROTIME-INR
INR: 1 (ref 0.8–1.2)
Prothrombin Time: 13.1 s (ref 11.4–15.2)

## 2023-03-14 LAB — BRAIN NATRIURETIC PEPTIDE: B Natriuretic Peptide: 49.2 pg/mL (ref 0.0–100.0)

## 2023-03-14 LAB — MAGNESIUM: Magnesium: 2.1 mg/dL (ref 1.7–2.4)

## 2023-03-14 MED ORDER — SACUBITRIL-VALSARTAN 97-103 MG PO TABS
1.0000 | ORAL_TABLET | Freq: Two times a day (BID) | ORAL | Status: DC
  Administered 2023-03-14 – 2023-03-18 (×6): 1 via ORAL
  Filled 2023-03-14 (×9): qty 1

## 2023-03-14 MED ORDER — ACETAMINOPHEN 325 MG PO TABS
650.0000 mg | ORAL_TABLET | ORAL | Status: DC | PRN
  Administered 2023-03-14: 650 mg via ORAL
  Filled 2023-03-14: qty 2

## 2023-03-14 MED ORDER — FUROSEMIDE 20 MG PO TABS
20.0000 mg | ORAL_TABLET | Freq: Every evening | ORAL | Status: DC
  Administered 2023-03-14 – 2023-03-17 (×4): 20 mg via ORAL
  Filled 2023-03-14 (×4): qty 1

## 2023-03-14 MED ORDER — AMIODARONE HCL IN DEXTROSE 360-4.14 MG/200ML-% IV SOLN
60.0000 mg/h | INTRAVENOUS | Status: AC
  Administered 2023-03-14: 60 mg/h via INTRAVENOUS

## 2023-03-14 MED ORDER — SPIRONOLACTONE 25 MG PO TABS
25.0000 mg | ORAL_TABLET | Freq: Every day | ORAL | Status: DC
  Administered 2023-03-14 – 2023-03-18 (×5): 25 mg via ORAL
  Filled 2023-03-14 (×5): qty 1

## 2023-03-14 MED ORDER — ENOXAPARIN SODIUM 60 MG/0.6ML IJ SOSY
50.0000 mg | PREFILLED_SYRINGE | INTRAMUSCULAR | Status: DC
  Administered 2023-03-14 – 2023-03-17 (×4): 50 mg via SUBCUTANEOUS
  Filled 2023-03-14 (×5): qty 0.6

## 2023-03-14 MED ORDER — AMIODARONE HCL IN DEXTROSE 360-4.14 MG/200ML-% IV SOLN
INTRAVENOUS | Status: AC
  Administered 2023-03-14: 60 mg/h via INTRAVENOUS
  Filled 2023-03-14: qty 200

## 2023-03-14 MED ORDER — POTASSIUM CHLORIDE CRYS ER 20 MEQ PO TBCR
20.0000 meq | EXTENDED_RELEASE_TABLET | Freq: Every day | ORAL | Status: DC
  Administered 2023-03-14 – 2023-03-18 (×5): 20 meq via ORAL
  Filled 2023-03-14 (×5): qty 1

## 2023-03-14 MED ORDER — EMPAGLIFLOZIN 10 MG PO TABS
10.0000 mg | ORAL_TABLET | Freq: Every day | ORAL | Status: DC
  Administered 2023-03-15 – 2023-03-18 (×4): 10 mg via ORAL
  Filled 2023-03-14 (×4): qty 1

## 2023-03-14 MED ORDER — FUROSEMIDE 40 MG PO TABS
40.0000 mg | ORAL_TABLET | ORAL | Status: DC
  Administered 2023-03-15 – 2023-03-18 (×4): 40 mg via ORAL
  Filled 2023-03-14 (×4): qty 1

## 2023-03-14 MED ORDER — AMIODARONE LOAD VIA INFUSION
150.0000 mg | Freq: Once | INTRAVENOUS | Status: AC
  Administered 2023-03-14: 150 mg via INTRAVENOUS
  Filled 2023-03-14: qty 83.34

## 2023-03-14 MED ORDER — CARVEDILOL 25 MG PO TABS
25.0000 mg | ORAL_TABLET | Freq: Two times a day (BID) | ORAL | Status: DC
  Administered 2023-03-14 – 2023-03-18 (×8): 25 mg via ORAL
  Filled 2023-03-14 (×8): qty 1

## 2023-03-14 MED ORDER — AMIODARONE HCL IN DEXTROSE 360-4.14 MG/200ML-% IV SOLN
30.0000 mg/h | INTRAVENOUS | Status: DC
  Administered 2023-03-14 – 2023-03-15 (×4): 30 mg/h via INTRAVENOUS
  Filled 2023-03-14 (×4): qty 200

## 2023-03-14 NOTE — ED Notes (Addendum)
Pt has a syncopal episode with a 16 second run of ventricular tachycardia. MD notified and responded to bedside.

## 2023-03-14 NOTE — Progress Notes (Signed)
PHARMACY - ANTICOAGULATION CONSULT NOTE  Pharmacy Consult for enoxaparin Indication: VTE prophylaxis  Allergies  Allergen Reactions   Benadryl [Diphenhydramine] Other (See Comments)    Pass out    Patient Measurements: Height: 5\' 7"  (170.2 cm) Weight: 109.3 kg (241 lb) IBW/kg (Calculated) : 66.1 Heparin Dosing Weight: 90.6  Vital Signs: Temp: 98.2 F (36.8 C) (10/18 1235) Temp Source: Oral (10/18 1235) BP: 103/58 (10/18 1604) Pulse Rate: 70 (10/18 1604)  Labs: Recent Labs    03/14/23 1230  HGB 14.4  HCT 43.9  PLT 325  LABPROT 13.1  INR 1.0  CREATININE 1.04    Estimated Creatinine Clearance: 121.4 mL/min (by C-G formula based on SCr of 1.04 mg/dL).   Medical History: Past Medical History:  Diagnosis Date   Cardiomyopathy (HCC)    Morbid obesity (HCC)     Medications:  Scheduled:   enoxaparin (LOVENOX) injection  50 mg Subcutaneous Q24H    Assessment: Patient has a BMI of 37.75. Will therefore start patient on obesity dosing for acutely ill medical patient (0.5 mg/kg q24h). CBC shows Hb of 14.4 and Plt of 325.   Goal of Therapy:  No need to monitor anti-Xa in this patient.  Monitor platelets by anticoagulation protocol: Yes   Plan:  Lovenox 50 mg q24h starting today. Pharmacy will sign off, please reconsult as needed.   Cedric Fishman 03/14/2023,4:26 PM

## 2023-03-14 NOTE — Telephone Encounter (Signed)
Several attempts to contact patient. Noted he is in ER for evaluation.

## 2023-03-14 NOTE — H&P (Addendum)
Cardiology Admission History and Physical   Patient ID: David Vang MRN: [id number]; DOB: 05/08/1991   Admission date: 03/14/2023  PCP:  Storm Frisk, MD    [facility]Care Providers Cardiologist:  Dr. Shirlee Latch EP: Dr. Lalla Brothers {   Chief Complaint:  ICD shock  Patient Profile:   David Vang is a 32 y.o. male with morbid obesity, NICM, chronic CHF who is being seen 03/14/2023 for the evaluation of ICD shock, VT.  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 Llorens found to have NICM may 2022 in the environment of a PNA, R/LHC on 10/25/20 with elevated right and left heart filling pressures, moderate pulmonary venous hypertension, low cardiac output but no CAD. cMRI with extensive airspace disease, right>left likely from recent pneumonia, LV severely dilated with EF 21%, nonspecific RV insertion site LGE.    He was started on GDMT and amiodarone for frequent PVCs, home with lifevest  Followed by AHF team and Dr. Lalla Brothers, amiodarone ultimately stopped given his young age Jan 2023 Despite GDMT his LVEF remained reduced, and underwent S-ICD implant Jan 2024 (narrow QRS)  He comes to the ER after being shocked by his ICD He reports feeling well, no unusual symptoms, work has been a bit more stressful then usual though not excessive. He has not had any CP, palpitations or cardiac awareness No no unusual SOB, minimal DOE Was woken by a shock from his device this morning Some hours later shocked twice, the patient had LOC with this, wife at bedside says she heard a noise went in the room and he kind of seizing/getting shocked by his device   In the ER he was observed to have NSVT runs, developed a sustained episode of VT associated with LOC that self converted and was started on amiodarone gtt  LABS K+ 3.9 Mag is pending BUN/Creat 16/1.04 BNP 49.2 WBC  10.5 H/H 14/43 Plts 325  He reports excellent medication compliance, BP runs high 80's-90's typically for him without symptoms He did not take his pills this AM prior to calling EMS     Past Medical History:  Diagnosis Date   Cardiomyopathy (HCC)    Morbid obesity (HCC)     Past Surgical History:  Procedure Laterality Date   HERNIA REPAIR     RIGHT/LEFT HEART CATH AND CORONARY ANGIOGRAPHY N/A 10/25/2020   Procedure: RIGHT/LEFT HEART CATH AND CORONARY ANGIOGRAPHY;  Surgeon: Laurey Morale, MD;  Location: [facility] INVASIVE CV LAB;  Service: Cardiovascular;  Laterality: N/A;   SUBQ ICD IMPLANT N/A 06/14/2022   Procedure: SUBQ ICD IMPLANT;  Surgeon: Lanier Prude, MD;  Location: [facility] INVASIVE CV LAB;  Service: Cardiovascular;  Laterality: N/A;     Medications Prior to Admission: Prior to Admission medications   Medication Sig Start Date End Date Taking? Authorizing Provider  carvedilol (COREG) 25 MG tablet Take 1 tablet (25 mg total) by mouth 2 (two) times daily with a meal. 12/26/22   Laurey Morale, MD  empagliflozin (JARDIANCE) 10 MG TABS tablet Take 1 tablet (10 mg total) by mouth daily before breakfast. 03/11/22   [place], Anderson Malta, FNP  furosemide (LASIX) 40 MG tablet Take 1 tablet (40 mg total) by mouth every morning AND 1/2 tablet (20 mg total) every evening. 08/05/22   Laurey Morale, MD  potassium chloride SA (KLOR-CON M) 20 MEQ tablet Take 1 tablet (20 mEq total)  by mouth daily. 12/26/22   Laurey Morale, MD  sacubitril-valsartan (ENTRESTO) 97-103 MG Take 1 tablet by mouth 2 (two) times daily. 05/31/22   Laurey Morale, MD  spironolactone (ALDACTONE) 25 MG tablet Take 1 tablet (25 mg total) by mouth daily. 05/17/22   Laurey Morale, MD     Allergies:    Allergies  Allergen Reactions   Benadryl [Diphenhydramine] Other (See Comments)    Pass out    Social History:   Social History   Socioeconomic History   Marital status: Married    Spouse name: Not on file    Number of children: Not on file   Years of education: Not on file   Highest education level: Not on file  Occupational History   Not on file  Tobacco Use   Smoking status: Never   Smokeless tobacco: Never  Substance and Sexual Activity   Alcohol use: Not Currently   Drug use: Never   Sexual activity: Not on file  Other Topics Concern   Not on file  Social History Narrative   Not on file   Social Determinants of Health   Financial Resource Strain: Medium Risk (10/24/2020)   Overall Financial Resource Strain (CARDIA)    Difficulty of Paying Living Expenses: Somewhat hard  Food Insecurity: No Food Insecurity (10/24/2020)   Hunger Vital Sign    Worried About Running Out of Food in the Last Year: Never true    Ran Out of Food in the Last Year: Never true  Transportation Needs: No Transportation Needs (10/24/2020)   PRAPARE - Administrator, Civil Service (Medical): No    Lack of Transportation (Non-Medical): No  Physical Activity: Not on file  Stress: Not on file  Social Connections: Not on file  Intimate Partner Violence: Not on file    Family History:   The patient's family history includes Heart failure in his father.    ROS:  Please see the history of present illness.  All other ROS reviewed and negative.     Physical Exam/Data:   Vitals:   03/14/23 1245 03/14/23 1300 03/14/23 1315 03/14/23 1330  BP: 94/69 (!) 88/53 (!) 91/56 94/64  Pulse: 77 72 66 77  Resp: 17 20 20 15   Temp:      TempSrc:      SpO2: 97% 97% 99% 98%  Weight:      Height:       No intake or output data in the 24 hours ending 03/14/23 1413    03/14/2023   12:26 PM 11/19/2022   11:13 AM 06/24/2022    3:24 AM  Last 3 Weights  Weight (lbs) 241 lb 244 lb 12.8 oz 252 lb  Weight (kg) 109.317 kg 111.041 kg 114.306 kg     Body mass index is 37.75 kg/m.  General:  Well nourished, well developed, in no acute distress HEENT: normal Neck: no JVD Vascular: No carotid bruits; Distal pulses  2+ bilaterally   Cardiac:  RRR; no murmurs, gallops or rubs Lungs:  CTA b/l, no wheezing, rhonchi or rales  Abd: soft, nontender  Ext: no edema Musculoskeletal:  No deformities Skin: warm and dry  Neuro:  no focal abnormalities noted Psych:  Normal affect    EKG:  The ECG that was done SR 74bpm, normal intervals, no ischemic changes was personally reviewed with dr. Elberta Fortis  Telemetry SR, intermittently frequent PVCsm couplets, NSPMVTs, sustained VT starts PMVT > MMVT rate, self  termintated  Relevant  CV Studies:  Echo  08/07/2021:  1. Left ventricular ejection fraction, by estimation, is 30 to 35%. The  left ventricle has moderately decreased function. The left ventricle  demonstrates global hypokinesis. The left ventricular internal cavity size  was mildly dilated. Left ventricular  diastolic parameters are consistent with Grade II diastolic dysfunction  (pseudonormalization). The average left ventricular global longitudinal  strain is -13.4 %. The global longitudinal strain is abnormal.   2. Right ventricular systolic function is mildly reduced. The right  ventricular size is normal. Tricuspid regurgitation signal is inadequate  for assessing PA pressure.   3. The mitral valve is normal in structure. Trivial mitral valve  regurgitation. No evidence of mitral stenosis.   4. The aortic valve is tricuspid. Aortic valve regurgitation is not  visualized. No aortic stenosis is present.   5. The inferior vena cava is normal in size with greater than 50%  respiratory variability, suggesting right atrial pressure of 3 mmHg.     Laboratory Data:  High Sensitivity Troponin:  No results for input(s): "TROPONINIHS" in the last 720 hours.    ChemistryNo results for input(s): "NA", "K", "CL", "CO2", "GLUCOSE", "BUN", "CREATININE", "CALCIUM", "MG", "GFRNONAA", "GFRAA", "ANIONGAP" in the last 168 hours.  No results for input(s): "PROT", "ALBUMIN", "AST", "ALT", "ALKPHOS", "BILITOT" in  the last 168 hours. Lipids No results for input(s): "CHOL", "TRIG", "HDL", "LABVLDL", "LDLCALC", "CHOLHDL" in the last 168 hours. HematologyNo results for input(s): "WBC", "RBC", "HGB", "HCT", "MCV", "MCH", "MCHC", "RDW", "PLT" in the last 168 hours. Thyroid No results for input(s): "TSH", "FREET4" in the last 168 hours. BNPNo results for input(s): "BNP", "PROBNP" in the last 168 hours.  DDimer No results for input(s): "DDIMER" in the last 168 hours.   Radiology/Studies:  No results found.   Assessment and Plan:   ICD shocks VT/VF storm   ICD interrogation notes 5NSVTs 2 treated 1st event was PMVT > VF with successful shock 2nd event, I dont see the start, PMVT> VF > ICD sock unsuccessful > VF 2nd shock w/success  3. NICM Volume stable Continue home meds with known baseline low BPs on his meds Follow   He has been started on amiodarone Pecola Haxton continue Admit to 2H bed   Risk Assessment/Risk Scores:    For questions or updates, please contact [facility] Please consult [email or web address] for contact info under     Signed, Sheilah Pigeon, PA-C  03/14/2023 2:13 PM   I have seen and examined this patient with Francis Dowse.  Agree with above, note added to reflect my findings.  With a past history as above.  Has a [employer] S ICD in place.  He presented to emergency room after getting shocked by his SICD.  He was awoken from ICD shock this morning.  He is subsequently had further ICD shocks.  Per the patient's wife, the episode in the morning, she felt that the patient was potentially seizing until he got shocked by his device.  Throughout the day, he is continued to have episodes of polymorphic VT/VF, some self terminating but others requiring ICD shock.  In the emergency room, he had an episode of VT resulting in loss of consciousness but self converted.  He was started on IV amiodarone.  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  Is not in clinical heart failure.  Electrolytes are without abnormality.  Ilian Wessell continue IV amiodarone.  At this point, due to the polymorphic VT and VF, no indication for ablation. Chronic systolic heart failure: Due to nonischemic cardiomyopathy.  Heart failure has seen the patient.  Cayci Mcnabb continue medications per their recommendations.  Kippy Melena M. Aul Mangieri MD 03/14/2023 5:35 PM
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
## 2023-03-14 NOTE — Consult Note (Addendum)
Advanced Heart Failure Team Consult Note   Primary Physician: Storm Frisk, MD PCP-Cardiologist:  None  Reason for Consultation: Chronic Systolic Heart Failure  HPI:    David Vang is seen today for evaluation of his chronic systolic heart failure at the request of Dr. Elberta Fortis.   32 y.o. WM with morbid obesity and family h/o cardiomyopathy, and systolic HF diagnosed in 5/22. Patient's father had a nonischemic cardiomyopathy and was implanted with a LVAD in his 29s. 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 He underwent R/LHC on 10/25/20 with elevated right and left heart filling pressures, moderate pulmonary venous hypertension, low cardiac output but no CAD. He was started on diuretics and GDMT and amiodarone and fitted with LifeVest for his PVCs.  Echo in 9/22 showed EF 25-30%, severe LV dilation, normal RV.   CPX in 10/22 showed moderate HF limitation.    Echo in 2/23 showed EF 30-35% with mild LV dilation, normal RV size with mildly decreased systolic function.   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    Last seen in HF clinic 03/11/22 doing well. Was not taking his Sherryll Burger or Marcelline Deist at the time due to cost. He was switched to [place] and restarted on [place] with co-pay card. He was referred for a home sleep study and referred to EP for ICD. Sleep study report 11/23 showed severe OSA and CPAP was recommended.   [employer] ICD implanted for primary prevention 06/14/22 by Dr Lalla Brothers. Was seen with EP for routine f/u 11/19/22, reported doing well, no shortness of breath, chest pain, edema, or weight gain. Reported compliance with all medications.  Pt presented to the ER today after receiving multiple shocks from his ICD with loss of consciousness. On admission, EKG SR 72 bpm, CXR w low lung volumes and mild  cardiomegaly, BNP 49, K 3.9, BUN/Cr 106/1.0, WBC 10.5. In the ER he was observed to have VT run,16 beats and self converted and was started on amiodarone gtt. Transferred to CCU.   Home Medications Prior to Admission medications   Medication Sig Start Date End Date Taking? Authorizing Provider  carvedilol (COREG) 25 MG tablet Take 1 tablet (25 mg total) by mouth 2 (two) times daily with a meal. 12/26/22  Yes Laurey Morale, MD  empagliflozin (JARDIANCE) 10 MG TABS tablet Take 1 tablet (10 mg total) by mouth daily before breakfast. 03/11/22  Yes [place], Anderson Malta, FNP  furosemide (LASIX) 40 MG tablet Take 1 tablet (40 mg total) by mouth every morning AND 1/2 tablet (20 mg total) every evening. 08/05/22  Yes Laurey Morale, MD  potassium chloride SA (KLOR-CON M) 20 MEQ tablet Take 1 tablet (20 mEq total) by mouth daily. 12/26/22  Yes Laurey Morale, MD  sacubitril-valsartan (ENTRESTO) 97-103 MG Take 1 tablet by mouth 2 (two) times daily. 05/31/22  Yes Laurey Morale, MD  spironolactone (ALDACTONE) 25 MG tablet Take 1 tablet (25 mg total) by mouth daily. 05/17/22  Yes Laurey Morale, MD    Past Medical History: Past Medical History:  Diagnosis Date   Cardiomyopathy [facility])    Morbid obesity [facility])     Past Surgical History: Past Surgical History:  Procedure Laterality Date   HERNIA REPAIR     RIGHT/LEFT HEART CATH AND CORONARY ANGIOGRAPHY N/A 10/25/2020   Procedure: RIGHT/LEFT HEART CATH AND  CORONARY ANGIOGRAPHY;  Surgeon: Laurey Morale, MD;  Location: [facility] INVASIVE CV LAB;  Service: Cardiovascular;  Laterality: N/A;   SUBQ ICD IMPLANT N/A 06/14/2022   Procedure: SUBQ ICD IMPLANT;  Surgeon: Lanier Prude, MD;  Location: [facility] INVASIVE CV LAB;  Service: Cardiovascular;  Laterality: N/A;    Family History: Family History  Problem Relation Age of Onset   Heart failure Father        dx'ed with cardiomyoopathy in his late 30's/40's, and ultimately required LVAD    Social  History: Social History   Socioeconomic History   Marital status: Married    Spouse name: Not on file   Number of children: Not on file   Years of education: Not on file   Highest education level: Not on file  Occupational History   Not on file  Tobacco Use   Smoking status: Never   Smokeless tobacco: Never  Substance and Sexual Activity   Alcohol use: Not Currently   Drug use: Never   Sexual activity: Not on file  Other Topics Concern   Not on file  Social History Narrative   Not on file   Social Determinants of Health   Financial Resource Strain: Medium Risk (10/24/2020)   Overall Financial Resource Strain (CARDIA)    Difficulty of Paying Living Expenses: Somewhat hard  Food Insecurity: No Food Insecurity (10/24/2020)   Hunger Vital Sign    Worried About Running Out of Food in the Last Year: Never true    Ran Out of Food in the Last Year: Never true  Transportation Needs: No Transportation Needs (10/24/2020)   PRAPARE - Administrator, Civil Service (Medical): No    Lack of Transportation (Non-Medical): No  Physical Activity: Not on file  Stress: Not on file  Social Connections: Not on file    Allergies:  Allergies  Allergen Reactions   Benadryl [Diphenhydramine] Other (See Comments)    Pass out    Objective:    Vital Signs:   Temp:  [98.1 F (36.7 C)-98.2 F (36.8 C)] 98.2 F (36.8 C) (10/18 1235) Pulse Rate:  [66-78] 71 (10/18 1445) Resp:  [13-20] 20 (10/18 1445) BP: (82-114)/(47-69) 114/61 (10/18 1445) SpO2:  [97 %-99 %] 98 % (10/18 1445) Weight:  [109.3 kg] 109.3 kg (10/18 1226)    Weight change: Filed Weights   03/14/23 1226  Weight: 109.3 kg    Intake/Output:  No intake or output data in the 24 hours ending 03/14/23 1500    Physical Exam    General:  Well appearing. No resp difficulty HEENT: normal Neck: supple. no JVD . Carotids 2+ bilat; no bruits. No lymphadenopathy or thyromegaly appreciated. Cor: PMI nondisplaced.  Regular rate & rhythm. No rubs, gallops or murmurs. Lungs: clear Abdomen: soft, nontender, nondistended. No hepatosplenomegaly. No bruits or masses. Good bowel sounds. Extremities: no cyanosis, clubbing, rash, no edema Neuro: alert & orientedx3, cranial nerves grossly intact. moves all 4 extremities w/o difficulty. Affect pleasant  Telemetry   SR 60-70s  EKG    SR 72 bpm  Labs   Basic Metabolic Panel: Recent Labs  Lab 03/14/23 1230  NA 137  K 3.9  CL 101  CO2 21*  GLUCOSE 106*  BUN 16  CREATININE 1.04  CALCIUM 9.4    Liver Function Tests: No results for input(s): "AST", "ALT", "ALKPHOS", "BILITOT", "PROT", "ALBUMIN" in the last 168 hours. No results for input(s): "LIPASE", "AMYLASE" in the last 168 hours. No results for input(s): "AMMONIA" in  the last 168 hours.  CBC: Recent Labs  Lab 03/14/23 1230  WBC 10.5  NEUTROABS 8.0*  HGB 14.4  HCT 43.9  MCV 83.8  PLT 325    Cardiac Enzymes: No results for input(s): "CKTOTAL", "CKMB", "CKMBINDEX", "TROPONINI" in the last 168 hours.  BNP: BNP (last 3 results) Recent Labs    03/14/23 1300  BNP 49.2    ProBNP (last 3 results) No results for input(s): "PROBNP" in the last 8760 hours.   CBG: No results for input(s): "GLUCAP" in the last 168 hours.  Coagulation Studies: Recent Labs    03/14/23 1230  LABPROT 13.1  INR 1.0     Imaging   No results found.   Medications:     Current Medications:   Infusions:  amiodarone 60 mg/hr (03/14/23 1334)   Followed by   amiodarone        Patient Profile   32 y.o. WM with morbid obesity and family h/o cardiomyopathy, and systolic HF diagnosed in 5/22. Presenting for SVT/VT evaluation and subsequent ICD shocks  Assessment/Plan   1. Chronic HFrEF, NICM - HF diagnosed in 5/22. Patient's father had a nonischemic cardiomyopathy and was implanted with a LVAD in his 45s. 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  - RHC/LHC 6/22: no CAD, mod elevated filling pressures and low CI 2.2 - cMRI 6/22:showed severe LV dilation, EF 21%, nonspecific RV insertion site LGE.  - Echo in 2/23 showed that EF is still low in the 30-35% range. - [employer] placed 1/24 by EP - Prev on digoxin, NYHA I-II not indicated - Euvolemic on exam. Continue Lasix PO 40mg  am and 20mg  pm - Continue Entresto 97/103 mg bid - Continue Coreg 25 mg bid - Continue spironolactone 25 mg daily - Continue Jardiance 10 mg daily - Advanced Therapies: Likely candidate for advanced therapies in the future. However, not currently indicated at this time given his functional status on GDMT.   2. VT/VF, ICD Shocks - h/o frequent PVCs. Mexiletine tried without much effect, switched to amiodarone but stopped d/t young age - presented with 3 ICD shocks this morning associated with 1 episode of loss of consciousness - in ED 16 beat run VT, self converted. - Amio gtt - EP following  3. OSA  - Sleep study 11/23 - CPAP recommended but has not been using at home  4. Depression:  - Continue sertraline 25 mg daily.   5. Obesity - Last clinic wt 3/23 115 kg, now 109 kg  Length of Stay: 0  [place] Lee, NP  03/14/2023, 3:00 PM  Advanced Heart Failure Team Pager [phone] (M-F; 7a - 5p)  Please contact [facility] for night-coverage after hours (4p -7a ) and weekends on [email or web address]  Patient seen and examined with the above-signed Advanced Practice Provider and/or Housestaff. I personally reviewed laboratory data, imaging studies and relevant notes. I independently examined the patient and formulated the important aspects of the plan. I have edited the note to reflect any of my changes or salient points. I have personally discussed the plan with the patient and/or family.  32 y/o male as above with systolic HF due to familial CM and h/o VT. 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 Recently went on 4-mile hike without problem. Compliant with all HF meds. NYHA I   Admitted with VT/VF.  Seen by EP and started on IV amio with good result. Electrolytes ok. BNP 49   General:  Well appearing. No resp difficulty HEENT: normal Neck: supple. no JVD. Carotids 2+ bilat; no bruits. No lymphadenopathy or thryomegaly appreciated. Cor: PMI nondisplaced. Regular rate & rhythm. No rubs, gallops or murmurs. Lungs: clear Abdomen: obese soft, nontender, nondistended. No hepatosplenomegaly. No bruits or masses. Good bowel sounds. Extremities: no cyanosis, clubbing, rash, edema Neuro: alert & orientedx3, cranial nerves grossly intact. moves all 4 extremities w/o difficulty. Affect pleasant   He has been very stable from HF perspective with NYHA I symptoms. BNP 49. Would repeat echo. Continue current GDMT. Agree with amio per EP. ? Need for VT ablation.   AHF team will s/o. Please call with questions. Let us know prior to d/c so we can arrange outpatient f/u in HF Clinic.   Arvilla Meres, MD  5:26 PM

## 2023-03-14 NOTE — ED Triage Notes (Signed)
Pt BIBGEMS from home after having his defibrillator going off twice. Pt states it goes off when his hr gets over 200 bpm. No hx of firing in the past. New placement in January 2024.   80 NSR 106/70 98% RA  Research officer, political party

## 2023-03-14 NOTE — Telephone Encounter (Signed)
EP has been patient in hospital.

## 2023-03-14 NOTE — Telephone Encounter (Signed)
Pt states he got a shock from the defibrillator. Please advise

## 2023-03-15 ENCOUNTER — Inpatient Hospital Stay (HOSPITAL_COMMUNITY): Payer: 59

## 2023-03-15 DIAGNOSIS — I5022 Chronic systolic (congestive) heart failure: Secondary | ICD-10-CM

## 2023-03-15 DIAGNOSIS — I472 Ventricular tachycardia, unspecified: Secondary | ICD-10-CM | POA: Diagnosis not present

## 2023-03-15 LAB — ECHOCARDIOGRAM COMPLETE
Area-P 1/2: 4.33 cm2
Height: 67 in
MV M vel: 2.86 m/s
MV Peak grad: 32.7 mm[Hg]
S' Lateral: 5.8 cm
Weight: 3950.64 [oz_av]

## 2023-03-15 LAB — CBC
HCT: 43.5 % (ref 39.0–52.0)
Hemoglobin: 14.1 g/dL (ref 13.0–17.0)
MCH: 27.4 pg (ref 26.0–34.0)
MCHC: 32.4 g/dL (ref 30.0–36.0)
MCV: 84.5 fL (ref 80.0–100.0)
Platelets: 306 10*3/uL (ref 150–400)
RBC: 5.15 MIL/uL (ref 4.22–5.81)
RDW: 13.4 % (ref 11.5–15.5)
WBC: 9.8 10*3/uL (ref 4.0–10.5)
nRBC: 0 % (ref 0.0–0.2)

## 2023-03-15 LAB — BASIC METABOLIC PANEL
Anion gap: 9 (ref 5–15)
BUN: 15 mg/dL (ref 6–20)
CO2: 27 mmol/L (ref 22–32)
Calcium: 8.9 mg/dL (ref 8.9–10.3)
Chloride: 101 mmol/L (ref 98–111)
Creatinine, Ser: 1.12 mg/dL (ref 0.61–1.24)
GFR, Estimated: >60 mL/min (ref >60)
Glucose, Bld: 116 mg/dL — ABNORMAL HIGH (ref 70–99)
Potassium: 4.1 mmol/L (ref 3.5–5.1)
Sodium: 137 mmol/L (ref 135–145)

## 2023-03-15 LAB — MAGNESIUM: Magnesium: 2.3 mg/dL (ref 1.7–2.4)

## 2023-03-15 MED ORDER — ORAL CARE MOUTH RINSE: 15.0000 mL | OROMUCOSAL | Status: DC | PRN

## 2023-03-15 MED ORDER — CHLORHEXIDINE GLUCONATE CLOTH 2 % EX PADS
6.0000 | MEDICATED_PAD | Freq: Every day | CUTANEOUS | Status: DC
  Administered 2023-03-15 – 2023-03-16 (×2): 6 via TOPICAL

## 2023-03-15 MED ORDER — PERFLUTREN LIPID MICROSPHERE
1.0000 mL | INTRAVENOUS | Status: AC | PRN
  Administered 2023-03-15: 3 mL via INTRAVENOUS

## 2023-03-15 NOTE — Plan of Care (Signed)

## 2023-03-15 NOTE — Progress Notes (Signed)
Rounding Note    Patient Name: David Vang Date of Encounter: 03/15/2023  David Vang Cardiologist: None   Subjective   Currently feeling well without acute complaint.  Improved from yesterday.  Inpatient Medications    Scheduled Meds:  carvedilol  25 mg Oral BID WC   Chlorhexidine Gluconate Cloth  6 each Topical Daily   empagliflozin  10 mg Oral QAC breakfast   enoxaparin (LOVENOX) injection  50 mg Subcutaneous Q24H   furosemide  40 mg Oral BH-q7a   And   furosemide  20 mg Oral QPM   potassium chloride SA  20 mEq Oral Daily   sacubitril-valsartan  1 tablet Oral BID   spironolactone  25 mg Oral Daily   Continuous Infusions:  amiodarone 30 mg/hr (03/15/23 1010)   PRN Meds: acetaminophen, mouth rinse   Vital Signs    Vitals:   03/15/23 0700 03/15/23 0800 03/15/23 0900 03/15/23 1000  BP: 99/73 96/60 (!) 89/58 (!) 89/65  Pulse: 67 60 60 73  Resp: 15 16 12 13   Temp:  98.3 F (36.8 C)    TempSrc:  Oral    SpO2: 94% 93% 94% 95%  Weight:      Height:        Intake/Output Summary (Last 24 hours) at 03/15/2023 1011 Last data filed at 03/15/2023 1000 Gross per 24 hour  Intake 517.83 ml  Output 950 ml  Net -432.17 ml      03/15/2023    6:00 AM 03/14/2023   12:26 PM 11/19/2022   11:13 AM  Last 3 Weights  Weight (lbs) 246 lb 14.6 oz 241 lb 244 lb 12.8 oz  Weight (kg) 112 kg 109.317 kg 111.041 kg      Telemetry    Sinus rhythm - Personally Reviewed  ECG     - Personally Reviewed  Physical Exam   GEN: No acute distress.   Neck: No JVD Cardiac: RRR, no murmurs, rubs, or gallops.  Respiratory: Clear to auscultation bilaterally. GI: Soft, nontender, non-distended  MS: No edema; No deformity. Neuro:  Nonfocal  Psych: Normal affect   Labs    High Sensitivity Troponin:  No results for input(s): "TROPONINIHS" in the last 720 hours.   Chemistry Recent Labs  Lab 03/14/23 1230 03/14/23 1803 03/15/23 0424  NA 137  --  137  K 3.9  --   4.1  CL 101  --  101  CO2 21*  --  27  GLUCOSE 106*  --  116*  BUN 16  --  15  CREATININE 1.04  --  1.12  CALCIUM 9.4  --  8.9  MG  --  2.1 2.3  GFRNONAA >60  --  >60  ANIONGAP 15  --  9    Lipids No results for input(s): "CHOL", "TRIG", "HDL", "LABVLDL", "LDLCALC", "CHOLHDL" in the last 168 hours.  Hematology Recent Labs  Lab 03/14/23 1230 03/15/23 0424  WBC 10.5 9.8  RBC 5.24 5.15  HGB 14.4 14.1  HCT 43.9 43.5  MCV 83.8 84.5  MCH 27.5 27.4  MCHC 32.8 32.4  RDW 13.2 13.4  PLT 325 306   Thyroid No results for input(s): "TSH", "FREET4" in the last 168 hours.  BNP Recent Labs  Lab 03/14/23 1300  BNP 49.2    DDimer No results for input(s): "DDIMER" in the last 168 hours.   Radiology    DG Chest Port 1 View  Result Date: 03/14/2023 CLINICAL DATA:  Defibrillator firing. EXAM: PORTABLE CHEST 1 VIEW  COMPARISON:  Two-view chest x-ray 1/19/4. FINDINGS: The heart size is exaggerated by low lung volumes. No focal airspace disease is present. No edema effusions are present. Subcutaneous defibrillator is stable in position. IMPRESSION: 1. Low lung volumes. 2. No acute cardiopulmonary disease. Electronically Signed   By: Marin Roberts M.D.   On: 03/14/2023 15:02    Cardiac Studies   Echo  08/07/2021:  1. Left ventricular ejection fraction, by estimation, is 30 to 35%. The  left ventricle has moderately decreased function. The left ventricle  demonstrates global hypokinesis. The left ventricular internal cavity size  was mildly dilated. Left ventricular  diastolic parameters are consistent with Grade II diastolic dysfunction  (pseudonormalization). The average left ventricular global longitudinal  strain is -13.4 %. The global longitudinal strain is abnormal.   2. Right ventricular systolic function is mildly reduced. The right  ventricular size is normal. Tricuspid regurgitation signal is inadequate  for assessing PA pressure.   3. The mitral valve is normal in  structure. Trivial mitral valve  regurgitation. No evidence of mitral stenosis.   4. The aortic valve is tricuspid. Aortic valve regurgitation is not  visualized. No aortic stenosis is present.   5. The inferior vena cava is normal in size with greater than 50%  respiratory variability, suggesting right atrial pressure of 3 mmHg.   Patient Profile     32 y.o. male with morbid obesity, NICM, chronic CHF who is being seen 03/14/2023 for the evaluation of ICD shock, VT.   Assessment & Plan    1.  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  Has improved significantly on IV amiodarone.  [name] continue IV amiodarone throughout the day today.  [name] plan to switch to p.o. amiodarone tomorrow.  2.  Chronic systolic heart failure: No obvious volume overload.  Continue heart failure medications.  For questions or updates, please contact [facility] Please consult [email or web address] for contact info under        Signed, [name] [name], MD  03/15/2023, 10:11 AM

## 2023-03-16 DIAGNOSIS — I472 Ventricular tachycardia, unspecified: Secondary | ICD-10-CM | POA: Diagnosis not present

## 2023-03-16 LAB — BASIC METABOLIC PANEL
Anion gap: 15 (ref 5–15)
BUN: 15 mg/dL (ref 6–20)
CO2: 25 mmol/L (ref 22–32)
Calcium: 9.6 mg/dL (ref 8.9–10.3)
Chloride: 98 mmol/L (ref 98–111)
Creatinine, Ser: 1.14 mg/dL (ref 0.61–1.24)
GFR, Estimated: >60 mL/min (ref >60)
Glucose, Bld: 91 mg/dL (ref 70–99)
Potassium: 3.4 mmol/L — ABNORMAL LOW (ref 3.5–5.1)
Sodium: 138 mmol/L (ref 135–145)

## 2023-03-16 LAB — MAGNESIUM: Magnesium: 2.1 mg/dL (ref 1.7–2.4)

## 2023-03-16 MED ORDER — POTASSIUM CHLORIDE CRYS ER 20 MEQ PO TBCR
20.0000 meq | EXTENDED_RELEASE_TABLET | Freq: Once | ORAL | Status: AC
  Administered 2023-03-16: 20 meq via ORAL
  Filled 2023-03-16: qty 1

## 2023-03-16 MED ORDER — AMIODARONE HCL 200 MG PO TABS
400.0000 mg | ORAL_TABLET | Freq: Two times a day (BID) | ORAL | Status: DC
  Administered 2023-03-16 – 2023-03-18 (×5): 400 mg via ORAL
  Filled 2023-03-16 (×5): qty 2

## 2023-03-16 MED ORDER — POTASSIUM CHLORIDE 10 MEQ/100ML IV SOLN
10.0000 meq | Freq: Once | INTRAVENOUS | Status: DC
  Administered 2023-03-16: 10 meq via INTRAVENOUS
  Filled 2023-03-16: qty 100

## 2023-03-16 NOTE — Progress Notes (Signed)
Rounding Note    Patient Name: David Vang Date of Encounter: 03/16/2023  [facility] Cardiologist: None   Subjective   No further ventricular arrhythmias.  No acute complaints.  Inpatient Medications    Scheduled Meds:  carvedilol  25 mg Oral BID WC   Chlorhexidine Gluconate Cloth  6 each Topical Daily   empagliflozin  10 mg Oral QAC breakfast   enoxaparin (LOVENOX) injection  50 mg Subcutaneous Q24H   furosemide  40 mg Oral BH-q7a   And   furosemide  20 mg Oral QPM   potassium chloride SA  20 mEq Oral Daily   sacubitril-valsartan  1 tablet Oral BID   spironolactone  25 mg Oral Daily   Continuous Infusions:  amiodarone 30 mg/hr (03/16/23 0800)   PRN Meds: acetaminophen, mouth rinse   Vital Signs    Vitals:   03/16/23 0500 03/16/23 0600 03/16/23 0700 03/16/23 0800  BP: (!) 82/64 93/67 106/79 106/78  Pulse: 65 (!) 52 60 60  Resp: 13 18 15 12   Temp:      TempSrc:      SpO2: 94% 94% 96% 95%  Weight:  111.4 kg    Height:        Intake/Output Summary (Last 24 hours) at 03/16/2023 1610 Last data filed at 03/16/2023 0800 Gross per 24 hour  Intake 419.83 ml  Output 3325 ml  Net -2905.17 ml      03/16/2023    6:00 AM 03/15/2023    6:00 AM 03/14/2023   12:26 PM  Last 3 Weights  Weight (lbs) 245 lb 9.5 oz 246 lb 14.6 oz 241 lb  Weight (kg) 111.4 kg 112 kg 109.317 kg      Telemetry    Sinus rhythm-personally reviewed  ECG    None new  Physical Exam   GEN: Well nourished, well developed, in no acute distress  HEENT: normal  Neck: no JVD, carotid bruits, or masses Cardiac: RRR; no murmurs, rubs, or gallops,no edema  Respiratory:  clear to auscultation bilaterally, normal work of breathing GI: soft, nontender, nondistended, + BS MS: no deformity or atrophy  Skin: warm and dry, device site well healed Neuro:  Strength and sensation are intact Psych: euthymic mood, full affect   Labs    High Sensitivity Troponin:  No results for  input(s): "TROPONINIHS" in the last 720 hours.   Chemistry Recent Labs  Lab 03/14/23 1230 03/14/23 1803 03/15/23 0424 03/16/23 0355  NA 137  --  137 138  K 3.9  --  4.1 3.4*  CL 101  --  101 98  CO2 21*  --  27 25  GLUCOSE 106*  --  116* 91  BUN 16  --  15 15  CREATININE 1.04  --  1.12 1.14  CALCIUM 9.4  --  8.9 9.6  MG  --  2.1 2.3 2.1  GFRNONAA >60  --  >60 >60  ANIONGAP 15  --  9 15    Lipids No results for input(s): "CHOL", "TRIG", "HDL", "LABVLDL", "LDLCALC", "CHOLHDL" in the last 168 hours.  Hematology Recent Labs  Lab 03/14/23 1230 03/15/23 0424  WBC 10.5 9.8  RBC 5.24 5.15  HGB 14.4 14.1  HCT 43.9 43.5  MCV 83.8 84.5  MCH 27.5 27.4  MCHC 32.8 32.4  RDW 13.2 13.4  PLT 325 306   Thyroid No results for input(s): "TSH", "FREET4" in the last 168 hours.  BNP Recent Labs  Lab 03/14/23 1300  BNP 49.2  DDimer No results for input(s): "DDIMER" in the last 168 hours.   Radiology    ECHOCARDIOGRAM COMPLETE  Result Date: 03/15/2023    ECHOCARDIOGRAM REPORT   Patient Name:   David Vang Date of Exam: 03/15/2023 Medical Rec #:  [id number]        Height:       67.0 in Accession #:    [id number]       Weight:       246.9 lb Date of Birth:  [birth year]       BSA:          2.212 m Patient Age:    31 years         BP:           97/64 mmHg Patient Gender: M                HR:           65 bpm. Exam Location:  Inpatient Procedure: 2D Echo, Cardiac Doppler, Color Doppler and Intracardiac            Opacification Agent Indications:    CHF  History:        Patient has prior history of Echocardiogram examinations, most                 recent 08/07/2021. Cardiomyopathy, Defibrillator,                 Arrythmias:Tachycardia; Risk Factors:Obesity.  Sonographer:    Milda Smart Referring Phys: [id number] [place] LEE  Sonographer Comments: Suboptimal apical window. Image acquisition challenging due to patient body habitus. IMPRESSIONS  1. Left ventricular ejection fraction, by  estimation, is 25 to 30%. The left ventricle has severely decreased function. The left ventricle demonstrates global hypokinesis. The left ventricular internal cavity size was severely dilated. Left ventricular diastolic parameters are indeterminate.  2. Right ventricular systolic function is normal. The right ventricular size is normal.  3. The mitral valve is normal in structure. Trivial mitral valve regurgitation. No evidence of mitral stenosis.  4. The aortic valve is tricuspid. Aortic valve regurgitation is not visualized. No aortic stenosis is present. Conclusion(s)/Recommendation(s): No left ventricular mural or apical thrombus/thrombi. FINDINGS  Left Ventricle: Left ventricular ejection fraction, by estimation, is 25 to 30%. The left ventricle has severely decreased function. The left ventricle demonstrates global hypokinesis. Definity contrast agent was given IV to delineate the left ventricular endocardial borders. The left ventricular internal cavity size was severely dilated. There is no left ventricular hypertrophy. Left ventricular diastolic parameters are indeterminate. Right Ventricle: The right ventricular size is normal. No increase in right ventricular wall thickness. Right ventricular systolic function is normal. Left Atrium: Left atrial size was normal in size. Right Atrium: Right atrial size was normal in size. Pericardium: Trivial pericardial effusion is present. Mitral Valve: The mitral valve is normal in structure. Trivial mitral valve regurgitation. No evidence of mitral valve stenosis. Tricuspid Valve: The tricuspid valve is normal in structure. Tricuspid valve regurgitation is trivial. No evidence of tricuspid stenosis. Aortic Valve: The aortic valve is tricuspid. Aortic valve regurgitation is not visualized. No aortic stenosis is present. Pulmonic Valve: The pulmonic valve was not well visualized. Pulmonic valve regurgitation is not visualized. No evidence of pulmonic stenosis. Aorta:  The aortic root and ascending aorta are structurally normal, with no evidence of dilitation. Venous: The inferior vena cava was not well visualized. IAS/Shunts: The interatrial septum was not well visualized.  LEFT VENTRICLE PLAX 2D LVIDd:  6.60 cm   Diastology LVIDs:         5.80 cm   LV e' medial:    5.98 cm/s LV PW:         0.90 cm   LV E/e' medial:  11.4 LV IVS:        0.70 cm   LV e' lateral:   9.03 cm/s LVOT diam:     2.30 cm   LV E/e' lateral: 7.6 LV SV:         75 LV SV Index:   34 LVOT Area:     4.15 cm  RIGHT VENTRICLE             IVC RV Basal diam:  3.00 cm     IVC diam: 1.10 cm RV S prime:     12.20 cm/s TAPSE (M-mode): 1.8 cm LEFT ATRIUM             Index        RIGHT ATRIUM           Index LA diam:        3.80 cm 1.72 cm/m   RA Area:     10.80 cm LA Vol (A2C):   41.3 ml 18.67 ml/m  RA Volume:   20.60 ml  9.31 ml/m LA Vol (A4C):   41.7 ml 18.85 ml/m LA Biplane Vol: 41.6 ml 18.81 ml/m  AORTIC VALVE LVOT Vmax:   95.80 cm/s LVOT Vmean:  65.700 cm/s LVOT VTI:    0.180 m  AORTA Ao Root diam: 2.90 cm Ao Asc diam:  2.90 cm MITRAL VALVE               TRICUSPID VALVE MV Area (PHT): 4.33 cm    TR Peak grad:   9.6 mmHg MV Decel Time: 175 msec    TR Vmax:        155.00 cm/s MR Peak grad: 32.7 mmHg MR Vmax:      286.00 cm/s  SHUNTS MV E velocity: 68.40 cm/s  Systemic VTI:  0.18 m MV A velocity: 46.00 cm/s  Systemic Diam: 2.30 cm MV E/A ratio:  1.49 Vishnu Priya Mallipeddi Electronically signed by Winfield Rast Mallipeddi Signature Date/Time: 03/15/2023/4:45:54 PM    Final    DG Chest Port 1 View  Result Date: 03/14/2023 CLINICAL DATA:  Defibrillator firing. EXAM: PORTABLE CHEST 1 VIEW COMPARISON:  Two-view chest x-ray 1/19/4. FINDINGS: The heart size is exaggerated by low lung volumes. No focal airspace disease is present. No edema effusions are present. Subcutaneous defibrillator is stable in position. IMPRESSION: 1. Low lung volumes. 2. No acute cardiopulmonary disease. Electronically Signed    By: Marin Roberts M.D.   On: 03/14/2023 15:02    Cardiac Studies   Echo  08/07/2021:  1. Left ventricular ejection fraction, by estimation, is 30 to 35%. The  left ventricle has moderately decreased function. The left ventricle  demonstrates global hypokinesis. The left ventricular internal cavity size  was mildly dilated. Left ventricular  diastolic parameters are consistent with Grade II diastolic dysfunction  (pseudonormalization). The average left ventricular global longitudinal  strain is -13.4 %. The global longitudinal strain is abnormal.   2. Right ventricular systolic function is mildly reduced. The right  ventricular size is normal. Tricuspid regurgitation signal is inadequate  for assessing PA pressure.   3. The mitral valve is normal in structure. Trivial mitral valve  regurgitation. No evidence of mitral stenosis.   4. The aortic valve is tricuspid. Aortic valve  regurgitation is not  visualized. No aortic stenosis is present.   5. The inferior vena cava is normal in size with greater than 50%  respiratory variability, suggesting right atrial pressure of 3 mmHg.   Patient Profile     32 y.o. male with morbid obesity, NICM, chronic CHF who is being seen 03/14/2023 for the evaluation of ICD shock, VT.   Assessment & Plan    1.  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  He does have a PVC prior to his episodes of VF, but this would be quite difficult to map and ablate.  Currently on amiodarone without further arrhythmia.  Deronda Christian switch to p.o. amiodarone today.  If he is doing well, likely transferred to the floor this afternoon.  2.  Chronic systolic heart failure: No obvious volume overload.  Continue home heart failure medications.   For questions or updates, please contact [facility] Please consult [email or web address] for contact info under        Signed, Melquiades Kovar [name], MD  03/16/2023, 8:38 AM

## 2023-03-16 NOTE — Progress Notes (Signed)
On midnight assessment patient complained of IV in L AC hurting. Explained it was just sore to move and to touch. Amio infusion stopped. IV flushed well and did not burn when flushed, but seemed a little swollen around the site. Swelling did not increase with flushing. Put in IV team consult for a new line and called pharmacy about possible infiltration. Pharmacist did not think it was an infiltration and said to just watch the site and call back if pain got worse or discoloration occurred. Gave patient an ice pack per pharmacy, and amio restarted in new PIV.

## 2023-03-16 NOTE — Plan of Care (Signed)

## 2023-03-17 ENCOUNTER — Ambulatory Visit: Payer: 59

## 2023-03-17 DIAGNOSIS — I472 Ventricular tachycardia, unspecified: Secondary | ICD-10-CM | POA: Diagnosis not present

## 2023-03-17 DIAGNOSIS — I5022 Chronic systolic (congestive) heart failure: Secondary | ICD-10-CM

## 2023-03-17 LAB — BASIC METABOLIC PANEL
Anion gap: 13 (ref 5–15)
BUN: 19 mg/dL (ref 6–20)
CO2: 27 mmol/L (ref 22–32)
Calcium: 9.3 mg/dL (ref 8.9–10.3)
Chloride: 95 mmol/L — ABNORMAL LOW (ref 98–111)
Creatinine, Ser: 1.23 mg/dL (ref 0.61–1.24)
GFR, Estimated: >60 mL/min (ref >60)
Glucose, Bld: 95 mg/dL (ref 70–99)
Potassium: 3.6 mmol/L (ref 3.5–5.1)
Sodium: 135 mmol/L (ref 135–145)

## 2023-03-17 LAB — MAGNESIUM: Magnesium: 2.2 mg/dL (ref 1.7–2.4)

## 2023-03-17 MED ORDER — POTASSIUM CHLORIDE CRYS ER 20 MEQ PO TBCR
40.0000 meq | EXTENDED_RELEASE_TABLET | Freq: Once | ORAL | Status: AC
  Administered 2023-03-17: 40 meq via ORAL
  Filled 2023-03-17: qty 2

## 2023-03-17 NOTE — Progress Notes (Signed)
  Patient Name: David Vang Date of Encounter: 03/17/2023  Primary Cardiologist: None Electrophysiologist: Lanier Prude, MD  Interval Summary   Transitioned to po amiodarone yesterday  The patient is doing well today.  At this time, the patient denies chest pain, shortness of breath, or any new concerns.  Vital Signs    Vitals:   03/17/23 0223 03/17/23 0224 03/17/23 0315 03/17/23 0341  BP: (!) 89/59   (!) 89/63  Pulse:    66  Resp: 11 14 15 14   Temp:    98.3 F (36.8 C)  TempSrc:    Oral  SpO2:    95%  Weight:      Height:        Intake/Output Summary (Last 24 hours) at 03/17/2023 0714 Last data filed at 03/16/2023 1700 Gross per 24 hour  Intake 531.29 ml  Output 1475 ml  Net -943.71 ml   Filed Weights   03/14/23 1226 03/15/23 0600 03/16/23 0600  Weight: 109.3 kg 112 kg 111.4 kg    Physical Exam    GEN- The patient is well appearing, alert and oriented x 3 today.   Lungs- Clear to ausculation bilaterally, normal work of breathing Cardiac- Regular rate and rhythm, no murmurs, rubs or gallops GI- soft, NT, ND, + BS Extremities- no clubbing or cyanosis. No edema  Telemetry    NSR 60-70s (personally reviewed)  Hospital Course    David Vang is a 32 y.o. male with morbid obesity, NICM, chronic CHF who is being seen 03/14/2023 for the evaluation of ICD shock, VT.   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   Continue amiodarone po.  Potentially home tomorrow if stable.  Potassium3.6 (10/21 0309) Magnesium  2.2 (10/21 0309) Creatinine, ser  1.23 (10/21 0309) Keep K > 4.0 and Mg > 2.0    Chronic systolic heart failure: Volume status stable.  Continue home heart failure medications.   For questions or updates, please contact [facility] Please consult [email or web address] for contact info under Cardiology/STEMI.  Signed, Graciella Freer, PA-C  03/17/2023, 7:14 AM

## 2023-03-17 NOTE — Progress Notes (Signed)
Patient ambulates independently. Pt declined to walk in morning. Pt stated he did not sleep last night and requested to be able to sleep. Pt in room with blackout sleep mask and personal fan blowing. Patient given medications at lunchtime. Pt had late lunch and ambulated 920 feet in hallway upon request. Wife to visit later. Pt resting with call bell within reach.  Will continue to monitor.

## 2023-03-17 NOTE — Progress Notes (Signed)
BP 89/59, pt asymptomatic, other VS stable.  Orson Aloe, MD notified. No new interventions at this time per Orson Aloe, MD.

## 2023-03-17 NOTE — Plan of Care (Signed)
  Problem: Education: Goal: Understanding of cardiac disease, CV risk reduction, and recovery process will improve Outcome: Completed/Met   Problem: Activity: Goal: Ability to tolerate increased activity will improve Outcome: Completed/Met   Problem: Cardiac: Goal: Ability to achieve and maintain adequate cardiovascular perfusion will improve Outcome: Progressing   Problem: Education: Goal: Knowledge of General Education information will improve Description: Including pain rating scale, medication(s)/side effects and non-pharmacologic comfort measures Outcome: Completed/Met   Problem: Clinical Measurements: Goal: Ability to maintain clinical measurements within normal limits will improve Outcome: Progressing Goal: Will remain free from infection Outcome: Progressing

## 2023-03-18 ENCOUNTER — Other Ambulatory Visit (HOSPITAL_COMMUNITY): Payer: Self-pay

## 2023-03-18 DIAGNOSIS — I472 Ventricular tachycardia, unspecified: Secondary | ICD-10-CM | POA: Diagnosis not present

## 2023-03-18 LAB — BASIC METABOLIC PANEL
Anion gap: 11 (ref 5–15)
BUN: 17 mg/dL (ref 6–20)
CO2: 26 mmol/L (ref 22–32)
Calcium: 9 mg/dL (ref 8.9–10.3)
Chloride: 99 mmol/L (ref 98–111)
Creatinine, Ser: 1.14 mg/dL (ref 0.61–1.24)
GFR, Estimated: >60 mL/min (ref >60)
Glucose, Bld: 105 mg/dL — ABNORMAL HIGH (ref 70–99)
Potassium: 4.1 mmol/L (ref 3.5–5.1)
Sodium: 136 mmol/L (ref 135–145)

## 2023-03-18 LAB — MAGNESIUM: Magnesium: 2.2 mg/dL (ref 1.7–2.4)

## 2023-03-18 MED ORDER — AMIODARONE HCL 200 MG PO TABS
ORAL_TABLET | ORAL | 0 refills | Status: DC
Start: 2023-03-18 — End: 2023-04-09
  Filled 2023-03-18: qty 76, 31d supply, fill #0

## 2023-03-18 NOTE — Discharge Summary (Signed)
ELECTROPHYSIOLOGY DISCHARGE SUMMARY    Patient ID: David Vang,  MRN: [id number], DOB/AGE: 09/20/1990 32 y.o.  Admit date: 03/14/2023 Discharge date: 03/18/2023  Primary Care Physician: David Frisk, MD  Primary Cardiologist: None  Electrophysiologist: Dr. Lalla Brothers   Primary Discharge Diagnosis:  VT/VF David  Secondary Discharge Diagnosis:  Chronic systolic CHF  Procedures This Admission:  Echo 03/15/2023 LVEF 25-30%, trivial MR   Brief HPI: David Vang is a 32 y.o. male with morbid obesity, NICM, chronic CHF who was admitted 03/14/2023 for the evaluation of ICD shock, VT.   Hospital Course:  The patient was admitted with ICD shock. HE was awoken in the morning and had further ICD shocks. His wife thought he was having seizure like activity, but this correlated with VT/VF. HE continued to have episodes of polymorphic VT, some self terminating.  With no obvious instigating factors, he was started back on IV amiodarone and transitioned to po as he settled. Not ablation candidate given polymorphic VT and VF.  HF team consulted who recommended continuing his GDMT and close HF clinic follow up.  He was monitored on telemetry overnight which demonstrated continue quiescence of his VT with transition to po amiodarone.   The patient was examined and considered to be stable for discharge.   The patient will be seen back by EP and HF clinic in close follow up.   Pt will take amiodarone 400 mg BID x 1 week, then continue 200 mg BID until follow up.   Personally reviewed NCD[place] driving restrictions with patient of no driving x 6 months after an ICD shock/VT/VF.   Physical Exam: Vitals:   03/17/23 2312 03/18/23 0355 03/18/23 0400 03/18/23 0748  BP: (!) 95/53 98/62  102/67  Pulse: 62 64  60  Resp: 15 15 20    Temp: 98.5 F (36.9 C) 98.5 F (36.9 C)  98 F (36.7 C)  TempSrc: Oral Oral  Oral  SpO2: 97% 97%  98%  Weight:      Height:        GEN- NAD. A&O x 3.   HEENT: Normocephalic, atraumatic Lungs- CTAB, Normal effort.  Heart- RRR, No M/G/R.  GI- Soft, NT, ND.  Extremities- No clubbing, cyanosis, or edema; Groin without hematoma   Discharge Medications:  Allergies as of 03/18/2023       Reactions   Benadryl [diphenhydramine] Other (See Comments)   Pass out        Medication List     TAKE these medications    amiodarone 200 MG tablet Commonly known as: PACERONE Take 2 tablets (400 mg total) by mouth 2 (two) times daily for 7 days, THEN 1 tablet (200 mg total) 2 (two) times daily. Start taking on: March 18, 2023   carvedilol 25 MG tablet Commonly known as: COREG Take 1 tablet (25 mg total) by mouth 2 (two) times daily with a meal.   Entresto 97-103 MG Generic drug: sacubitril-valsartan Take 1 tablet by mouth 2 (two) times daily.   furosemide 40 MG tablet Commonly known as: LASIX Take 1 tablet (40 mg total) by mouth every morning AND 1/2 tablet (20 mg total) every evening.   Jardiance 10 MG Tabs tablet Generic drug: empagliflozin Take 1 tablet (10 mg total) by mouth daily before breakfast.   potassium chloride SA 20 MEQ tablet Commonly known as: KLOR-CON M Take 1 tablet (20 mEq total) by mouth daily.   spironolactone 25 MG tablet Commonly known as: ALDACTONE Take 1 tablet (25 mg total)  by mouth daily.        Disposition: Home with usual follow up as in AVS  Duration of Discharge Encounter: Greater than 30 minutes including physician time.  Dustin Flock, PA-C  03/18/2023 9:12 AM

## 2023-03-18 NOTE — Progress Notes (Signed)
Discharge instructions reviewed with pt and his wife, both verbalized understanding and able to teach back.  Copy of instructions given to pt. [facility] filled pt's one script and pt will be taken by [facility] on the way out for discharge.  Pt to be d/c'd via wheelchair with belongings, with wife.            To be escorted by hospital volunteer.   Annice Needy, RN SWOT

## 2023-03-18 NOTE — Progress Notes (Signed)
Pt stable for transition home today, to follow up as per AVS, no TOC needs noted. Family to transport home     03/18/23 0955  TOC Brief Assessment  Insurance and Status Reviewed  Patient has primary care physician Yes  Home environment has been reviewed home w/ spouse  Prior level of function: self  Prior/Current Home Services No current home services  Social Determinants of Health Reivew SDOH reviewed no interventions necessary  Readmission risk has been reviewed Yes  Transition of care needs no transition of care needs at this time

## 2023-03-19 ENCOUNTER — Telehealth: Payer: Self-pay

## 2023-03-19 LAB — CUP PACEART REMOTE DEVICE CHECK
Battery Remaining Percentage: 84 %
Date Time Interrogation Session: 20241022184000
HighPow Impedance: 125 Ohm
Implantable Lead Connection Status: 753985
Implantable Lead Implant Date: 20240119
Implantable Lead Location: 753862
Implantable Lead Model: 3501
Implantable Lead Serial Number: 248506
Implantable Pulse Generator Implant Date: 20240119
Pulse Gen Serial Number: 197105

## 2023-03-19 NOTE — Transitions of Care (Post Inpatient/ED Visit) (Signed)
   03/19/2023  Name: [name] MRN: [id number] DOB: [birth year]  Today's TOC FU Call Status: Today's TOC FU Call Status:: Unsuccessful Call (1st Attempt) Unsuccessful Call (1st Attempt) Date: 03/19/23  Attempted to reach the patient regarding the most recent Inpatient/ED visit.  Follow Up Plan: Additional outreach attempts will be made to reach the patient to complete the Transitions of Care (Post Inpatient/ED visit) call.   Alyse Low, RN, BA, [facility], CRRN [facility] Coordinator, Transition of Care Ph # [phone]

## 2023-03-21 ENCOUNTER — Telehealth: Payer: Self-pay

## 2023-03-21 NOTE — Transitions of Care (Post Inpatient/ED Visit) (Signed)
   03/21/2023  Name: David Vang MRN: [id number] DOB: 10/28/1990  Today's TOC FU Call Status: Today's TOC FU Call Status:: Unsuccessful Call (2nd Attempt) Unsuccessful Call (2nd Attempt) Date: 03/21/23  Attempted to reach the patient regarding the most recent Inpatient/ED visit.  Follow Up Plan: Additional outreach attempts will be made to reach the patient to complete the Transitions of Care (Post Inpatient/ED visit) call.   Alyse Low, RN, BA, [facility], CRRN [facility] Coordinator, Transition of Care Ph # [phone]

## 2023-03-24 ENCOUNTER — Ambulatory Visit (HOSPITAL_COMMUNITY)
Admit: 2023-03-24 | Discharge: 2023-03-24 | Disposition: A | Discharge status: Home / Self Care | Payer: 59 | Source: Ambulatory Visit | Attending: Family Medicine

## 2023-03-24 ENCOUNTER — Encounter (HOSPITAL_COMMUNITY): Payer: Self-pay

## 2023-03-24 ENCOUNTER — Telehealth: Payer: Self-pay

## 2023-03-24 VITALS — BP 110/68 | HR 62 | Wt 251.6 lb

## 2023-03-24 DIAGNOSIS — I428 Other cardiomyopathies: Secondary | ICD-10-CM | POA: Diagnosis not present

## 2023-03-24 DIAGNOSIS — I493 Ventricular premature depolarization: Secondary | ICD-10-CM | POA: Insufficient documentation

## 2023-03-24 DIAGNOSIS — Z7984 Long term (current) use of oral hypoglycemic drugs: Secondary | ICD-10-CM | POA: Diagnosis not present

## 2023-03-24 DIAGNOSIS — I472 Ventricular tachycardia, unspecified: Secondary | ICD-10-CM | POA: Insufficient documentation

## 2023-03-24 DIAGNOSIS — G4733 Obstructive sleep apnea (adult) (pediatric): Secondary | ICD-10-CM | POA: Diagnosis not present

## 2023-03-24 DIAGNOSIS — R0602 Shortness of breath: Secondary | ICD-10-CM | POA: Insufficient documentation

## 2023-03-24 DIAGNOSIS — Z79899 Other long term (current) drug therapy: Secondary | ICD-10-CM | POA: Diagnosis not present

## 2023-03-24 DIAGNOSIS — I5022 Chronic systolic (congestive) heart failure: Secondary | ICD-10-CM

## 2023-03-24 LAB — BASIC METABOLIC PANEL
Anion gap: 5 (ref 5–15)
BUN: 16 mg/dL (ref 6–20)
CO2: 28 mmol/L (ref 22–32)
Calcium: 8.5 mg/dL — ABNORMAL LOW (ref 8.9–10.3)
Chloride: 102 mmol/L (ref 98–111)
Creatinine, Ser: 1.26 mg/dL — ABNORMAL HIGH (ref 0.61–1.24)
GFR, Estimated: >60 mL/min (ref >60)
Glucose, Bld: 117 mg/dL — ABNORMAL HIGH (ref 70–99)
Potassium: 3.9 mmol/L (ref 3.5–5.1)
Sodium: 135 mmol/L (ref 135–145)

## 2023-03-24 LAB — BRAIN NATRIURETIC PEPTIDE: B Natriuretic Peptide: 40.2 pg/mL (ref 0.0–100.0)

## 2023-03-24 LAB — MAGNESIUM: Magnesium: 2.2 mg/dL (ref 1.7–2.4)

## 2023-03-24 NOTE — Patient Instructions (Addendum)
Medication Changes:  No Changes In Medications at this time.   Lab Work:  Labs done today, your results will be available in MyChart, we will contact you for abnormal readings.  Referrals:  YOU HAVE BEEN REFERRED TO DR. Mayford Knife FOR SLEEP THEY WILL REACH OUT TO YOU OR CALL TO ARRANGE THIS. PLEASE CALL us WITH ANY CONCERNS   Follow-Up in: 2-3 MONTHS WITH DR. Shirlee Latch PLEASE CALL OUR OFFICE AROUND NOVEMBER 2024 TO GET SCHEDULED FOR YOUR APPOINTMENT. PHONE NUMBER IS [phone] OPTION 2   At the [facility], you and your health needs are our priority. We have a designated team specialized in the treatment of Heart Failure. This Care Team includes your primary Heart Failure Specialized Cardiologist (physician), Advanced Practice Providers (APPs- Physician Assistants and Nurse Practitioners), and Pharmacist who all work together to provide you with the care you need, when you need it.   You may see any of the following providers on your designated Care Team at your next follow up:  Dr. Arvilla Meres Dr. Marca Ancona Dr. Dorthula Nettles Dr. Theresia Bough Tonye Becket, NP Robbie Lis, [place] [facility] [place], [place] Brynda Peon, NP [place] Lee, NP Karle Plumber, PharmD   Please be sure to bring in all your medications bottles to every appointment.   Need to Contact us:  If you have any questions or concerns before your next appointment please send [place] a message through [place] or call our office at [phone].    TO LEAVE A MESSAGE FOR THE NURSE SELECT OPTION 2, PLEASE LEAVE A MESSAGE INCLUDING: YOUR NAME DATE OF BIRTH CALL BACK NUMBER REASON FOR CALL**this is important as we prioritize the call backs  YOU WILL RECEIVE A CALL BACK THE SAME DAY AS LONG AS YOU CALL BEFORE 4:00 PM

## 2023-03-24 NOTE — Transitions of Care (Post Inpatient/ED Visit) (Signed)
   03/24/2023  Name: David Vang MRN: [id number] DOB: Oct 15, 1990  Today's TOC FU Call Status: Today's TOC FU Call Status:: Unsuccessful Call (3rd Attempt) Unsuccessful Call (3rd Attempt) Date: 03/24/23  Attempted to reach the patient regarding the most recent Inpatient/ED visit.  Follow Up Plan: No further outreach attempts will be made at this time. We have been unable to contact the patient.  Alyse Low, RN, BA, [facility], CRRN [facility] Coordinator, Transition of Care Ph # [phone]

## 2023-03-24 NOTE — Addendum Note (Signed)
Encounter addended by: Baird Cancer, RN on: 03/24/2023 9:04 AM  Actions taken: Clinical Note Signed

## 2023-03-24 NOTE — Progress Notes (Signed)
Advanced Heart Failure Clinic Note   Referring Physician: PCP: Storm Frisk, MD PCP-Cardiologist: Dr. Shirlee Latch  EP: Dr. Lalla Brothers   Reason for Visit: post hospital f/u, s/p VT Storm + ICD shocks in setting of chronic systolic heart failure   HPI: 32 y.o. WM with morbid obesity and family h/o cardiomyopathy, and systolic HF diagnosed in 5/22. Patient's father had a nonischemic cardiomyopathy and was implanted with a LVAD in his 74s. 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 He underwent R/LHC on 10/25/20 with elevated right and left heart filling pressures, moderate pulmonary venous hypertension, low cardiac output but no CAD. He was started on diuretics and GDMT and amiodarone and fitted with LifeVest for his PVCs.   Echo in 9/22 showed EF 25-30%, severe LV dilation, normal RV.   CPX in 10/22 showed moderate HF limitation.    Echo in 2/23 showed EF 30-35% with mild LV dilation, normal RV size with mildly decreased systolic function.   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     Last seen in HF clinic 03/11/22 doing well. Was not taking his Sherryll Burger or Marcelline Deist at the time due to cost. He was switched to [place] and restarted on [place] with co-pay card. He was referred for a home sleep study and referred to EP for ICD. Sleep study report 11/23 showed severe OSA and CPAP was recommended.    [employer] ICD implanted for primary prevention 06/14/22 by Dr Lalla Brothers. Was seen with EP for routine f/u 11/19/22, reported doing well, no shortness of breath, chest pain, edema, or weight gain. Reported compliance with all medications.   Pt presented to the ER 03/14/23 after receiving multiple shocks from his ICD with loss of consciousness. On admission, EKG SR 72 bpm, CXR w low lung volumes and mild cardiomegaly, BNP 49, K 3.9, Mg 2.1, BUN/Cr  106/1.0, WBC 10.5. In the ER he was observed to have VT run,16 beats and self converted and was started on amiodarone gtt. Transferred to CCU. Echo showed EF 25-30%, RV normal. AHF team was consulted and he was felt stable from a HF perspective. 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 EP continued amiodarone load and VT remained quiescent. Transitioned to PO amio. HF GDMT continued. Discharged on 10/22.   He presents to clinic today for post hospital f/u. Reports doing well. Denies any further ICD shocks. Interrogation today shows no further VT. He reports full medication compliance. Taking amio as directed. Stable from HF standpoint, w/ stable NYHA Class II symptoms. No difficulties walking on flat surfaces. SOB ambulating up stairs. No LEE, orthopnea or PND. Denies CP. BP well controlled 110/68.    Review of Systems: [y] = yes, [ ]  = no   General: Weight gain [ ] ; Weight loss [ ] ; Anorexia [ ] ; Fatigue [ ] ; Fever [ ] ; Chills [ ] ; Weakness [ ]   Cardiac: Chest pain/pressure [ ] ; Resting SOB [ ] ; Exertional SOB [ ] ; Orthopnea [ ] ; Pedal Edema [ ] ; Palpitations [ ] ; Syncope [ ] ; Presyncope [ ] ; Paroxysmal nocturnal dyspnea[ ]   Pulmonary: Cough [ ] ; Wheezing[ ] ; Hemoptysis[ ] ; Sputum [ ] ; Snoring [ ]   GI: Vomiting[ ] ; Dysphagia[ ] ; Melena[ ] ; Hematochezia [ ] ; Heartburn[ ] ; Abdominal pain [ ] ; Constipation [ ] ; Diarrhea [ ] ; BRBPR [ ]   GU: Hematuria[ ] ;  Dysuria [ ] ; Nocturia[ ]   Vascular: Pain in legs with walking [ ] ; Pain in feet with lying flat [ ] ; Non-healing sores [ ] ; Stroke [ ] ; TIA [ ] ; Slurred speech [ ] ;  Neuro: Headaches[ ] ; Vertigo[ ] ; Seizures[ ] ; Paresthesias[ ] ;Blurred vision [ ] ; Diplopia [ ] ; Vision changes [ ]   Ortho/Skin: Arthritis [ ] ; Joint pain [ ] ; Muscle pain [ ] ; Joint swelling [ ] ; Back Pain [ ] ; Rash [ ]   Psych: Depression[ ] ; Anxiety[ ]   Heme: Bleeding problems [ ] ; Clotting disorders [ ] ; Anemia [ ]   Endocrine: Diabetes [ ] ; Thyroid  dysfunction[ ]    Past Medical History:  Diagnosis Date   Cardiomyopathy (HCC)    Morbid obesity (HCC)     Current Outpatient Medications  Medication Sig Dispense Refill   amiodarone (PACERONE) 200 MG tablet Take 2 tablets (400 mg total) by mouth 2 (two) times daily for 7 days, THEN 1 tablet (200 mg total) 2 (two) times daily. 88 tablet 0   carvedilol (COREG) 25 MG tablet Take 1 tablet (25 mg total) by mouth 2 (two) times daily with a meal. 180 tablet 3   empagliflozin (JARDIANCE) 10 MG TABS tablet Take 1 tablet (10 mg total) by mouth daily before breakfast. 30 tablet 6   furosemide (LASIX) 40 MG tablet Take 1 tablet (40 mg total) by mouth every morning AND 1/2 tablet (20 mg total) every evening. 45 tablet 5   potassium chloride SA (KLOR-CON M) 20 MEQ tablet Take 1 tablet (20 mEq total) by mouth daily. 90 tablet 3   sacubitril-valsartan (ENTRESTO) 97-103 MG Take 1 tablet by mouth 2 (two) times daily. 180 tablet 3   spironolactone (ALDACTONE) 25 MG tablet Take 1 tablet (25 mg total) by mouth daily. 30 tablet 11   No current facility-administered medications for this encounter.    Allergies  Allergen Reactions   Benadryl [Diphenhydramine] Other (See Comments)    Pass out      Social History   Socioeconomic History   Marital status: Married    Spouse name: Not on file   Number of children: Not on file   Years of education: Not on file   Highest education level: Not on file  Occupational History   Not on file  Tobacco Use   Smoking status: Never   Smokeless tobacco: Never  Substance and Sexual Activity   Alcohol use: Not Currently   Drug use: Never   Sexual activity: Not on file  Other Topics Concern   Not on file  Social History Narrative   Not on file   Social Determinants of Health   Financial Resource Strain: Medium Risk (10/24/2020)   Overall Financial Resource Strain (CARDIA)    Difficulty of Paying Living Expenses: Somewhat hard  Food Insecurity: No Food  Insecurity (03/14/2023)   Hunger Vital Sign    Worried About Running Out of Food in the Last Year: Never true    Ran Out of Food in the Last Year: Never true  Transportation Needs: No Transportation Needs (03/14/2023)   PRAPARE - Administrator, Civil Service (Medical): No    Lack of Transportation (Non-Medical): No  Physical Activity: Not on file  Stress: Not on file  Social Connections: Not on file  Intimate Partner Violence: Not At Risk (03/14/2023)   Humiliation, Afraid, Rape, and Kick questionnaire    Fear of Current or Ex-Partner: No    Emotionally Abused: No    Physically Abused: No  Sexually Abused: No      Family History  Problem Relation Age of Onset   Heart failure Father        dx'ed with cardiomyoopathy in his late 30's/40's, and ultimately required LVAD    Vitals:   03/24/23 0837  BP: 110/68  Pulse: 62  SpO2: 99%  Weight: 114.1 kg (251 lb 9.6 oz)     PHYSICAL EXAM: General:  Well appearing, moderately obese. No respiratory difficulty HEENT: normal Neck: supple. no JVD. Carotids 2+ bilat; no bruits. No lymphadenopathy or thyromegaly appreciated. Cor: PMI nondisplaced. Regular rate & rhythm. No rubs, gallops or murmurs. Lungs: clear Abdomen: soft, nontender, nondistended. No hepatosplenomegaly. No bruits or masses. Good bowel sounds. Extremities: no cyanosis, clubbing, rash, edema Neuro: alert & oriented x 3, cranial nerves grossly intact. moves all 4 extremities w/o difficulty. Affect pleasant.  ECG: not performed    ASSESSMENT & PLAN:  1. Chronic HFrEF, NICM - HF diagnosed in 5/22. Patient's father had a nonischemic cardiomyopathy and was implanted with a LVAD in his 36s. 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  - RHC/LHC 6/22: no CAD, mod elevated filling pressures and low CI 2.2 - cMRI 6/22:showed severe LV dilation, EF 21%,  nonspecific RV insertion site LGE.  - Echo in 2/23 showed that EF is still low in the 30-35% range - [employer] placed 1/24 by EP - Echo 10/24 EF 20-25%, RV normal  - NYHA II. Euvolemic on exam  - Continue Lasix PO 40mg  am and 20mg  pm. Check BMP/BNP today  - Continue Entresto 97/103 mg bid - Continue Coreg 25 mg bid - Continue spironolactone 25 mg daily - Continue Jardiance 10 mg daily - Advanced Therapies: Likely candidate for advanced therapies in the future. However, not currently indicated at this time given his functional status on GDMT.   2. VT  - h/o frequent PVCs. Mexiletine tried without much effect, switched to amiodarone but later stopped d/t young age - recent admit 10/24 w/ VT storm treated w/ 3 ICD shocks. In ED had 16 beat run VT, self converted. Electrolytes were ok.  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 No VT on device interrogation today  - Continue amiodarone taper per EP, will reduce to 200 mg bid later this wk  - Check BMP/Mg level   3. OSA  - Sleep study report 11/23 showed severe OSA - CPAP recommended but he did not follow-up. We discussed this and he wants to start treatment. Will refer back to sleep clinic     F/u in 2-3 months w/ Dr. Lillia Mountain, PA-C 03/24/23

## 2023-04-03 NOTE — Progress Notes (Signed)
Remote ICD transmission.   

## 2023-04-04 ENCOUNTER — Encounter: Payer: Self-pay | Admitting: Internal Medicine

## 2023-04-07 ENCOUNTER — Other Ambulatory Visit: Payer: Self-pay | Admitting: Cardiology

## 2023-04-07 ENCOUNTER — Other Ambulatory Visit (HOSPITAL_COMMUNITY): Payer: Self-pay

## 2023-04-07 ENCOUNTER — Other Ambulatory Visit (HOSPITAL_COMMUNITY): Payer: Self-pay | Admitting: Family Medicine

## 2023-04-07 MED ORDER — EMPAGLIFLOZIN 10 MG PO TABS
10.0000 mg | ORAL_TABLET | Freq: Every day | ORAL | 6 refills | Status: AC
  Filled 2023-04-07: qty 30, 30d supply, fill #0
  Filled 2023-05-13: qty 30, 30d supply, fill #1

## 2023-04-09 ENCOUNTER — Other Ambulatory Visit (HOSPITAL_COMMUNITY): Payer: Self-pay

## 2023-04-09 MED FILL — Amiodarone HCl Tab 200 MG: ORAL | 90 days supply | Qty: 180 | Fill #0 | Status: CN

## 2023-04-10 ENCOUNTER — Other Ambulatory Visit (HOSPITAL_COMMUNITY): Payer: Self-pay

## 2023-04-10 ENCOUNTER — Other Ambulatory Visit: Payer: Self-pay

## 2023-04-10 MED FILL — Amiodarone HCl Tab 200 MG: ORAL | 30 days supply | Qty: 60 | Fill #0 | Status: CN

## 2023-04-14 NOTE — Progress Notes (Unsigned)
  Electrophysiology Office Note:   ID:  David Vang, DOB 1991-03-06, MRN [id number]  Primary Cardiologist: None Electrophysiologist: Lanier Prude, MD  {Click to update primary MD,subspecialty MD or APP then REFRESH:1}    History of Present Illness:   David Vang is a 32 y.o. male with h/o VT, PVCs, obesity, and HFreEF seen today for post hospital follow up.    Admitted ***  Since discharge from hospital the patient reports doing ***.  he denies chest pain, palpitations, dyspnea, PND, orthopnea, nausea, vomiting, dizziness, syncope, edema, weight gain, or early satiety.   Review of systems complete and found to be negative unless listed in HPI.   EP Information / Studies Reviewed:    EKG is not ordered today. EKG from 03/14/2023 reviewed which showed NSR at 71 bpm       ICD Interrogation-  reviewed in detail today,  See PACEART report.  Device History: Environmental manager S-ICD ICD implanted 06/14/2022 for CHF / primary prevention   Echo 03/15/2023 LVEF 25-30%, normal RV, trivial MR  Physical Exam:   VS:  There were no vitals taken for this visit.   Wt Readings from Last 3 Encounters:  03/24/23 251 lb 9.6 oz (114.1 kg)  03/16/23 245 lb 9.5 oz (111.4 kg)  11/19/22 244 lb 12.8 oz (111 kg)     GEN: Well nourished, well developed in no acute distress NECK: No JVD; No carotid bruits CARDIAC: {EPRHYTHM:28826}, no murmurs, rubs, gallops RESPIRATORY:  Clear to auscultation without rales, wheezing or rhonchi  ABDOMEN: Soft, non-tender, non-distended EXTREMITIES:  No edema; No deformity   ASSESSMENT AND PLAN:    VT/VF Storm PVCs Continue amiodarone *** Labs today  Chronic systolic dysfunction s/p Architect  euvolemic today Stable on an appropriate medical regimen Normal ICD function See Pace Art report No changes today  Disposition:   Follow up with {EPPROVIDERS:28135} {EPFOLLOW UP:28173}   Signed, Graciella Freer, PA-C

## 2023-04-15 ENCOUNTER — Other Ambulatory Visit (HOSPITAL_COMMUNITY): Payer: Self-pay

## 2023-04-15 ENCOUNTER — Encounter: Payer: Self-pay | Admitting: Student

## 2023-04-15 ENCOUNTER — Ambulatory Visit: Payer: 59 | Attending: Student | Admitting: Student

## 2023-04-15 VITALS — BP 118/62 | HR 80 | Ht 67.0 in | Wt 257.0 lb

## 2023-04-15 DIAGNOSIS — G4733 Obstructive sleep apnea (adult) (pediatric): Secondary | ICD-10-CM

## 2023-04-15 DIAGNOSIS — Z95810 Presence of automatic (implantable) cardiac defibrillator: Secondary | ICD-10-CM | POA: Diagnosis not present

## 2023-04-15 DIAGNOSIS — I5022 Chronic systolic (congestive) heart failure: Secondary | ICD-10-CM | POA: Diagnosis not present

## 2023-04-15 DIAGNOSIS — I472 Ventricular tachycardia, unspecified: Secondary | ICD-10-CM

## 2023-04-15 LAB — CUP PACEART INCLINIC DEVICE CHECK
Date Time Interrogation Session: 20241119105243
Implantable Lead Connection Status: 753985
Implantable Lead Implant Date: 20240119
Implantable Lead Location: 753862
Implantable Lead Model: 3501
Implantable Lead Serial Number: 248506
Implantable Pulse Generator Implant Date: 20240119
Pulse Gen Serial Number: 197105

## 2023-04-15 MED ORDER — AMIODARONE HCL 200 MG PO TABS: 200.0000 mg | ORAL_TABLET | Freq: Every day | ORAL | Status: DC

## 2023-04-15 NOTE — Patient Instructions (Addendum)
Medication Instructions:  Decrease amiodarone to once daily. *If you need a refill on your cardiac medications before your next appointment, please call your pharmacy*  Lab Work: CMET, TSH, FreeT4-TODAY If you have labs (blood work) drawn today and your tests are completely normal, you will receive your results only by: MyChart Message (if you have MyChart) OR A paper copy in the mail If you have any lab test that is abnormal or we need to change your treatment, we will call you to review the results.  Follow-Up: At [facility], you and your health needs are our priority.  As part of our continuing mission to provide you with exceptional heart care, we have created designated Provider Care Teams.  These Care Teams include your primary Cardiologist (physician) and Advanced Practice Providers (APPs -  Physician Assistants and Nurse Practitioners) who all work together to provide you with the care you need, when you need it.  Your next appointment:   As scheduled

## 2023-04-16 LAB — COMPREHENSIVE METABOLIC PANEL
ALT: 20 [IU]/L (ref 0–44)
AST: 21 [IU]/L (ref 0–40)
Albumin: 4.5 g/dL (ref 4.1–5.1)
Alkaline Phosphatase: 78 [IU]/L (ref 44–121)
BUN/Creatinine Ratio: 16 (ref 9–20)
BUN: 19 mg/dL (ref 6–20)
Bilirubin Total: 0.2 mg/dL (ref 0.0–1.2)
CO2: 28 mmol/L (ref 20–29)
Calcium: 9.2 mg/dL (ref 8.7–10.2)
Chloride: 103 mmol/L (ref 96–106)
Creatinine, Ser: 1.21 mg/dL (ref 0.76–1.27)
Globulin, Total: 2.3 g/dL (ref 1.5–4.5)
Glucose: 139 mg/dL — ABNORMAL HIGH (ref 70–99)
Potassium: 4.4 mmol/L (ref 3.5–5.2)
Sodium: 142 mmol/L (ref 134–144)
Total Protein: 6.8 g/dL (ref 6.0–8.5)
eGFR: 82 mL/min/{1.73_m2} (ref >59)

## 2023-04-16 LAB — T4, FREE: Free T4: 1.21 ng/dL (ref 0.82–1.77)

## 2023-04-16 LAB — TSH: TSH: 4.92 u[IU]/mL — ABNORMAL HIGH (ref 0.450–4.500)

## 2023-04-28 ENCOUNTER — Other Ambulatory Visit (HOSPITAL_COMMUNITY): Payer: Self-pay

## 2023-05-07 ENCOUNTER — Other Ambulatory Visit (HOSPITAL_COMMUNITY): Payer: Self-pay

## 2023-05-07 ENCOUNTER — Other Ambulatory Visit: Payer: Self-pay | Admitting: Cardiology

## 2023-05-07 DIAGNOSIS — I472 Ventricular tachycardia, unspecified: Secondary | ICD-10-CM

## 2023-05-07 MED FILL — Amiodarone HCl Tab 200 MG: ORAL | 31 days supply | Qty: 31 | Fill #0 | Status: AC

## 2023-05-13 ENCOUNTER — Encounter (HOSPITAL_COMMUNITY): Payer: Self-pay

## 2023-05-13 ENCOUNTER — Ambulatory Visit: Payer: 59 | Attending: Cardiology | Admitting: Cardiology

## 2023-05-13 ENCOUNTER — Other Ambulatory Visit: Payer: Self-pay

## 2023-05-13 ENCOUNTER — Encounter: Payer: Self-pay | Admitting: Cardiology

## 2023-05-13 ENCOUNTER — Other Ambulatory Visit (HOSPITAL_COMMUNITY): Payer: Self-pay

## 2023-05-13 VITALS — BP 115/64 | HR 77 | Resp 16 | Ht 67.0 in | Wt 260.0 lb

## 2023-05-13 DIAGNOSIS — G4733 Obstructive sleep apnea (adult) (pediatric): Secondary | ICD-10-CM

## 2023-05-13 NOTE — Addendum Note (Signed)
Addended by: Luellen Pucker on: 05/13/2023 02:52 PM   Modules accepted: Orders

## 2023-05-13 NOTE — Progress Notes (Signed)
Sleep Medicine CONSULT Note    Date:  05/13/2023   ID:  David Vang, DOB 1991/01/17, MRN [id number]  PCP:  Storm Frisk, MD  Cardiologist: Marca Ancona, MD  Chief Complaint  Patient presents with   New Patient (Initial Visit)    osa    History of Present Illness:  David Vang is a 32 y.o. male who is being seen today for the evaluation of sleep apnea at the request of Marca Ancona, MD.  This is a 32 year old morbidly obese male with a history of car vamorolone allele cardiomyopathy, chronic systolic CHF with EF 20 to 25% and normal coronary arteries on cath.  Also that is post AICD with a history also of VT.  Due to heart failure symptoms, frequent PVCs and VT concern for sleep apnea was suspected.  He also complained of feeling tired when he woke up and some days has to nap due to fatigue.  He does snore but no witnessed apneas but has been told that he gurgles in his sleep. Stop Bang Score 6.  Patient underwent home sleep study showing severe obstructive sleep apnea with an AHI of 31.3/h.    Past Medical History:  Diagnosis Date   Cardiomyopathy (HCC)    Morbid obesity (HCC)     Past Surgical History:  Procedure Laterality Date   HERNIA REPAIR     RIGHT/LEFT HEART CATH AND CORONARY ANGIOGRAPHY N/A 10/25/2020   Procedure: RIGHT/LEFT HEART CATH AND CORONARY ANGIOGRAPHY;  Surgeon: Laurey Morale, MD;  Location: [facility] INVASIVE CV LAB;  Service: Cardiovascular;  Laterality: N/A;   SUBQ ICD IMPLANT N/A 06/14/2022   Procedure: SUBQ ICD IMPLANT;  Surgeon: Lanier Prude, MD;  Location: [facility] INVASIVE CV LAB;  Service: Cardiovascular;  Laterality: N/A;    Current Medications: Current Meds  Medication Sig   amiodarone (PACERONE) 200 MG tablet Take 1 tablet (200 mg total) by mouth daily.   carvedilol (COREG) 25 MG tablet Take 1 tablet (25 mg total) by mouth 2 (two) times daily with a meal.   empagliflozin (JARDIANCE) 10 MG TABS tablet Take 1 tablet (10 mg total)  by mouth daily before breakfast.   furosemide (LASIX) 40 MG tablet Take 1 tablet (40 mg total) by mouth every morning AND 1/2 tablet (20 mg total) every evening.   potassium chloride SA (KLOR-CON M) 20 MEQ tablet Take 1 tablet (20 mEq total) by mouth daily.   sacubitril-valsartan (ENTRESTO) 97-103 MG Take 1 tablet by mouth 2 (two) times daily.   spironolactone (ALDACTONE) 25 MG tablet Take 1 tablet (25 mg total) by mouth daily.    Allergies:   Benadryl [diphenhydramine]   Social History   Socioeconomic History   Marital status: Married    Spouse name: Not on file   Number of children: 0   Years of education: Not on file   Highest education level: Not on file  Occupational History   Not on file  Tobacco Use   Smoking status: Never   Smokeless tobacco: Never  Vaping Use   Vaping status: Never Used  Substance and Sexual Activity   Alcohol use: Not Currently   Drug use: Never   Sexual activity: Not on file  Other Topics Concern   Not on file  Social History Narrative   Not on file   Social Drivers of Health   Financial Resource Strain: Medium Risk (10/24/2020)   Overall Financial Resource Strain (CARDIA)    Difficulty of Paying Living Expenses: Somewhat  hard  Food Insecurity: No Food Insecurity (03/14/2023)   Hunger Vital Sign    Worried About Running Out of Food in the Last Year: Never true    Ran Out of Food in the Last Year: Never true  Transportation Needs: No Transportation Needs (03/14/2023)   PRAPARE - Administrator, Civil Service (Medical): No    Lack of Transportation (Non-Medical): No  Physical Activity: Not on file  Stress: Not on file  Social Connections: Not on file     Family History:  The patient's family history includes Heart disease in his father; Heart failure in his father.   ROS:   Please see the history of present illness.    ROS All other systems reviewed and are negative.      No data to display             PHYSICAL  EXAM:   VS:  BP 115/64 (BP Location: Right Arm, Patient Position: Sitting, Cuff Size: Large)   Pulse 77   Resp 16   Ht 5\' 7"  (1.702 m)   Wt 260 lb (117.9 kg)   SpO2 97%   BMI 40.72 kg/m    GEN: Well nourished, well developed, in no acute distress  HEENT: normal  Neck: no JVD, carotid bruits, or masses Cardiac: RRR; no murmurs, rubs, or gallops,no edema.  Intact distal pulses bilaterally.  Respiratory:  clear to auscultation bilaterally, normal work of breathing GI: soft, nontender, nondistended, + BS MS: no deformity or atrophy  Skin: warm and dry, no rash Neuro:  Alert and Oriented x 3, Strength and sensation are intact Psych: euthymic mood, full affect  Wt Readings from Last 3 Encounters:  05/13/23 260 lb (117.9 kg)  04/15/23 257 lb (116.6 kg)  03/24/23 251 lb 9.6 oz (114.1 kg)      Studies/Labs Reviewed:   SLEEP STUDY  Recent Labs: 03/15/2023: Hemoglobin 14.1; Platelets 306 03/24/2023: B Natriuretic Peptide 40.2; Magnesium 2.2 04/15/2023: ALT 20; BUN 19; Creatinine, Ser 1.21; Potassium 4.4; Sodium 142; TSH 4.920    ASSESSMENT:    1. OSA (obstructive sleep apnea)   2. Morbid obesity (HCC)      PLAN:  In order of problems listed above:  OSA -he has excessive daytime sleepiness with a Stop Bang score of 6. -PSG showed severe OSA with AHI > 30/hr -discussed treatment options:   Not candidate for Inspire as he has not been on CPAP yet.  Given degree of OSA would not recommend oral device -will sent to sleep lab for formal CPAP titration  Morbid Obesity -encouraged to watch diet and get increase walking   Time Spent: 15 minutes total time of encounter, including 15 minutes spent in face-to-face patient care on the date of this encounter. This time includes coordination of care and counseling regarding above mentioned problem list. Remainder of non-face-to-face time involved reviewing chart documents/testing relevant to the patient encounter and documentation in  the medical record. I have independently reviewed documentation from referring provider  Medication Adjustments/Labs and Tests Ordered: Current medicines are reviewed at length with the patient today.  Concerns regarding medicines are outlined above.  Medication changes, Labs and Tests ordered today are listed in the Patient Instructions below.  There are no Patient Instructions on file for this visit.   Signed, Armanda Magic, MD  05/13/2023 2:49 PM    [facility] [address] [place], [place], [place]  [address] Phone: [phone]; Fax: [phone]

## 2023-05-13 NOTE — Patient Instructions (Signed)
Medication Instructions:  Your physician recommends that you continue on your current medications as directed. Please refer to the Current Medication list given to you today.  *If you need a refill on your cardiac medications before your next appointment, please call your pharmacy*   Lab Work: None.  If you have labs (blood work) drawn today and your tests are completely normal, you will receive your results only by: MyChart Message (if you have MyChart) OR A paper copy in the mail If you have any lab test that is abnormal or we need to change your treatment, we will call you to review the results.   Testing/Procedures: Your physician has recommended that you have a sleep study at [facility] called a cpap titration. This test records several body functions during sleep, including: brain activity, eye movement, oxygen and carbon dioxide blood levels, heart rate and rhythm, breathing rate and rhythm, the flow of air through your mouth and nose, snoring, body muscle movements, and chest and belly movement.    Follow-Up:  Your next appointment will be dependent on the results of your cpap titration and it will be with:     Provider:   Dr. Armanda Magic, MD

## 2023-05-23 ENCOUNTER — Other Ambulatory Visit (HOSPITAL_COMMUNITY): Payer: Self-pay

## 2023-05-26 NOTE — Progress Notes (Deleted)
  Electrophysiology Office Follow up Visit Note:    Date:  05/26/2023   ID:  David Vang, DOB 1991/04/13, MRN [id number]  PCP:  Brien Belvie BRAVO, MD  [facility] Cardiologist:  None  [facility] Electrophysiologist:  OLE ONEIDA HOLTS, MD    Interval History:     David Vang is a 32 y.o. male who presents for a follow up visit.   He was last seen by [facility] April 15, 2023.  He is known to me from the outpatient setting.  He has a subcutaneous ICD that was implanted June 14, 2022 for primary prevention.  He has received appropriate therapies from his device.  He is on amiodarone .        Past medical, surgical, social and family history were reviewed.  ROS:   Please see the history of present illness.    All other systems reviewed and are negative.  EKGs/Labs/Other Studies Reviewed:    The following studies were reviewed today:  May 27, 2023 in clinic device interrogation personally reviewed ***        Physical Exam:    VS:  There were no vitals taken for this visit.    Wt Readings from Last 3 Encounters:  05/13/23 260 lb (117.9 kg)  04/15/23 257 lb (116.6 kg)  03/24/23 251 lb 9.6 oz (114.1 kg)     GEN: no distress CARD: RRR, No MRG RESP: No IWOB. CTAB.      ASSESSMENT:    1. VT (ventricular tachycardia) (HCC)   2. Morbid obesity (HCC)   3. ICD (implantable cardioverter-defibrillator) in place   4. Encounter for long-term (current) use of high-risk medication   5. Chronic systolic heart failure (HCC)    PLAN:    In order of problems listed above:  #Ventricular tachycardia #ICD in situ His ICD is functioning appropriately. Continue amiodarone  200 mg by mouth once daily.  Liver and thyroid  function okay on November blood work.  Plan to repeat this in 3 to 4 months.  I will have him see an APP around that time.  #Chronic systolic heart failure NYHA class II. Continue Coreg , Jardiance , Lasix , potassium, Entresto ,  spironolactone   Follow-up in 3 to 4 months with the EP APP.  Signed, Ole Holts, MD, [facility], [facility] 05/26/2023 7:19 PM    Electrophysiology [facility]

## 2023-05-27 ENCOUNTER — Ambulatory Visit: Payer: Self-pay | Admitting: Cardiology

## 2023-06-16 ENCOUNTER — Encounter: Payer: Self-pay | Admitting: Cardiology

## 2023-06-16 ENCOUNTER — Ambulatory Visit (INDEPENDENT_AMBULATORY_CARE_PROVIDER_SITE_OTHER): Payer: 59

## 2023-06-16 DIAGNOSIS — I5022 Chronic systolic (congestive) heart failure: Secondary | ICD-10-CM

## 2023-06-16 LAB — CUP PACEART REMOTE DEVICE CHECK
Battery Remaining Percentage: 82 %
Date Time Interrogation Session: 20250120071600
HighPow Impedance: 130 Ohm
Implantable Lead Connection Status: 753985
Implantable Lead Implant Date: 20240119
Implantable Lead Location: 753862
Implantable Lead Model: 3501
Implantable Lead Serial Number: 248506
Implantable Pulse Generator Implant Date: 20240119
Pulse Gen Serial Number: 197105

## 2023-06-17 ENCOUNTER — Other Ambulatory Visit (HOSPITAL_BASED_OUTPATIENT_CLINIC_OR_DEPARTMENT_OTHER): Payer: Self-pay

## 2023-06-17 MED ORDER — BENZONATATE 200 MG PO CAPS
200.0000 mg | ORAL_CAPSULE | Freq: Three times a day (TID) | ORAL | 0 refills | Status: AC
  Filled 2023-06-17: qty 30, 10d supply, fill #0

## 2023-07-09 NOTE — Progress Notes (Deleted)
  Electrophysiology Office Follow up Visit Note:    Date:  07/09/2023   ID:  David Vang, DOB 1990-08-08, MRN [id number]  PCP:  David Frisk, MD  [facility] Cardiologist:  None  [facility] Electrophysiologist:  David Prude, MD    Interval History:     David Vang is a 33 y.o. male who presents for a follow up visit.   The patient was last seen by [facility] April 15, 2023.  He has a history of ventricular tachycardia, PVCs, obesity and systolic heart failure.  He was admitted in October with VT David and was loaded with amiodarone.  At the appoint with David Vang he was doing well.  His amiodarone was decreased to 200 mg mouth once daily.         Past medical, surgical, social and family history were reviewed.  ROS:   Please see the history of present illness.    All other systems reviewed and are negative.  EKGs/Labs/Other Studies Reviewed:    The following studies were reviewed today:  July 10, 2023 in clinic device interrogation personally reviewed ***        Physical Exam:    VS:  There were no vitals taken for this visit.    Wt Readings from Last 3 Encounters:  05/13/23 260 lb (117.9 kg)  04/15/23 257 lb (116.6 kg)  03/24/23 251 lb 9.6 oz (114.1 kg)     GEN: no distress CARD: RRR, No MRG RESP: No IWOB. CTAB.      ASSESSMENT:    No diagnosis found. PLAN:    In order of problems listed above:  #Chronic systolic heart failure #Ventricular tachycardia #High risk drug monitoring-amiodarone The patient has a history of chronic systolic heart failure complicated by ventricular tachycardia.  He has an ICD in situ with prior appropriate ICD shocks.  His ventricular arrhythmias have been quiescent on amiodarone. Plan for repeat CMP, TSH and free T4 today.  Follow-up in 6 months with EP APP.  Signed, David Dunn, MD, [facility], [facility] 07/09/2023 9:37 PM    Electrophysiology [facility]

## 2023-07-10 ENCOUNTER — Ambulatory Visit: Payer: Self-pay | Attending: Cardiology | Admitting: Cardiology

## 2023-07-10 DIAGNOSIS — I472 Ventricular tachycardia, unspecified: Secondary | ICD-10-CM

## 2023-07-10 DIAGNOSIS — Z79899 Other long term (current) drug therapy: Secondary | ICD-10-CM

## 2023-07-10 DIAGNOSIS — I5022 Chronic systolic (congestive) heart failure: Secondary | ICD-10-CM

## 2023-07-11 ENCOUNTER — Encounter: Payer: Self-pay | Admitting: Cardiology

## 2023-07-24 NOTE — Progress Notes (Signed)
 Remote ICD transmission.

## 2023-07-27 ENCOUNTER — Other Ambulatory Visit (HOSPITAL_COMMUNITY): Payer: Self-pay | Admitting: Cardiology

## 2023-07-27 MED FILL — Amiodarone HCl Tab 200 MG: ORAL | 31 days supply | Qty: 31 | Fill #1 | Status: AC

## 2023-07-28 ENCOUNTER — Other Ambulatory Visit (HOSPITAL_COMMUNITY): Payer: Self-pay

## 2023-07-30 ENCOUNTER — Other Ambulatory Visit (HOSPITAL_COMMUNITY): Payer: Self-pay

## 2023-07-30 MED ORDER — SPIRONOLACTONE 25 MG PO TABS
25.0000 mg | ORAL_TABLET | Freq: Every day | ORAL | 11 refills | Status: DC
  Filled 2023-07-30: qty 30, 30d supply, fill #0
  Filled 2023-10-14: qty 30, 30d supply, fill #1

## 2023-07-30 MED ORDER — FUROSEMIDE 40 MG PO TABS
ORAL_TABLET | ORAL | 5 refills | Status: DC
  Filled 2023-07-30: qty 45, 30d supply, fill #0
  Filled 2023-10-14: qty 45, 30d supply, fill #1

## 2023-09-15 ENCOUNTER — Ambulatory Visit (INDEPENDENT_AMBULATORY_CARE_PROVIDER_SITE_OTHER): Payer: 59

## 2023-09-15 DIAGNOSIS — I472 Ventricular tachycardia, unspecified: Secondary | ICD-10-CM

## 2023-09-15 DIAGNOSIS — I5022 Chronic systolic (congestive) heart failure: Secondary | ICD-10-CM

## 2023-09-16 ENCOUNTER — Encounter: Payer: Self-pay | Admitting: Cardiology

## 2023-09-16 LAB — CUP PACEART REMOTE DEVICE CHECK
Battery Remaining Percentage: 79 %
Date Time Interrogation Session: 20250421133500
HighPow Impedance: 140 Ohm
Implantable Lead Connection Status: 753985
Implantable Lead Implant Date: 20240119
Implantable Lead Location: 753862
Implantable Lead Model: 3501
Implantable Lead Serial Number: 248506
Implantable Pulse Generator Implant Date: 20240119
Pulse Gen Serial Number: 197105

## 2023-10-14 ENCOUNTER — Other Ambulatory Visit (HOSPITAL_COMMUNITY): Payer: Self-pay

## 2023-10-14 MED FILL — Amiodarone HCl Tab 200 MG: ORAL | 31 days supply | Qty: 31 | Fill #2 | Status: AC

## 2023-11-04 NOTE — Addendum Note (Signed)
 Addended by: Edra Govern D on: 11/04/2023 05:19 PM   Modules accepted: Orders

## 2023-11-04 NOTE — Progress Notes (Signed)
 Remote ICD transmission.

## 2023-11-11 ENCOUNTER — Other Ambulatory Visit (HOSPITAL_COMMUNITY): Payer: Self-pay | Admitting: Cardiology

## 2023-11-11 ENCOUNTER — Other Ambulatory Visit: Payer: Self-pay | Admitting: Cardiology

## 2023-11-11 ENCOUNTER — Other Ambulatory Visit (HOSPITAL_COMMUNITY): Payer: Self-pay

## 2023-11-11 DIAGNOSIS — I472 Ventricular tachycardia, unspecified: Secondary | ICD-10-CM

## 2023-11-12 ENCOUNTER — Other Ambulatory Visit (HOSPITAL_COMMUNITY): Payer: Self-pay

## 2023-11-12 ENCOUNTER — Encounter (HOSPITAL_COMMUNITY): Payer: Self-pay

## 2023-11-12 ENCOUNTER — Encounter: Payer: Self-pay | Admitting: Cardiology

## 2023-11-12 ENCOUNTER — Telehealth (HOSPITAL_COMMUNITY): Payer: Self-pay | Admitting: Cardiology

## 2023-11-12 DIAGNOSIS — I5022 Chronic systolic (congestive) heart failure: Secondary | ICD-10-CM

## 2023-11-12 DIAGNOSIS — I472 Ventricular tachycardia, unspecified: Secondary | ICD-10-CM

## 2023-11-12 NOTE — Telephone Encounter (Signed)
 Pt returned call to triage-left VM stating He is unable to come in for follow up. Will be leaving out of town next week for a few months  Requests to have meds filled in 90 day quantity  Last [facility] follow 02/2023 overdue for 05/2023 follow up  -will confirm with provider

## 2023-11-12 NOTE — Telephone Encounter (Signed)
 Patient left VM on triage line requesting refills of all meds in 90 days supply with upcoming travel plans  Pt is overdue for follow up [facility] to arrange appt

## 2023-11-14 ENCOUNTER — Other Ambulatory Visit (HOSPITAL_BASED_OUTPATIENT_CLINIC_OR_DEPARTMENT_OTHER): Payer: Self-pay

## 2023-11-14 ENCOUNTER — Other Ambulatory Visit (HOSPITAL_COMMUNITY): Payer: Self-pay

## 2023-11-14 MED ORDER — POTASSIUM CHLORIDE CRYS ER 20 MEQ PO TBCR
20.0000 meq | EXTENDED_RELEASE_TABLET | Freq: Every day | ORAL | 3 refills | Status: AC
  Filled 2023-11-14: qty 90, 90d supply, fill #0

## 2023-11-14 MED ORDER — CARVEDILOL 25 MG PO TABS
25.0000 mg | ORAL_TABLET | Freq: Two times a day (BID) | ORAL | 3 refills | Status: AC
  Filled 2023-11-14: qty 180, 90d supply, fill #0

## 2023-11-14 MED ORDER — SPIRONOLACTONE 25 MG PO TABS
25.0000 mg | ORAL_TABLET | Freq: Every day | ORAL | 11 refills | Status: AC
  Filled 2023-11-14: qty 90, 90d supply, fill #0
  Filled 2023-11-14: qty 30, 30d supply, fill #0

## 2023-11-14 MED ORDER — FUROSEMIDE 40 MG PO TABS
ORAL_TABLET | ORAL | 5 refills | Status: AC
  Filled 2023-11-14: qty 135, 90d supply, fill #0
  Filled 2023-11-14: qty 45, 30d supply, fill #0

## 2023-11-14 MED FILL — Amiodarone HCl Tab 200 MG: ORAL | 90 days supply | Qty: 90 | Fill #3 | Status: CN

## 2023-11-14 MED FILL — Amiodarone HCl Tab 200 MG: ORAL | 90 days supply | Qty: 90 | Fill #0 | Status: AC

## 2023-12-29 ENCOUNTER — Ambulatory Visit: Payer: Self-pay

## 2023-12-29 DIAGNOSIS — I5022 Chronic systolic (congestive) heart failure: Secondary | ICD-10-CM

## 2023-12-30 LAB — CUP PACEART REMOTE DEVICE CHECK
Battery Remaining Percentage: 75 %
Date Time Interrogation Session: 20250804205000
HighPow Impedance: 130 Ohm
Implantable Lead Connection Status: 753985
Implantable Lead Implant Date: 20240119
Implantable Lead Location: 753862
Implantable Lead Model: 3501
Implantable Lead Serial Number: 248506
Implantable Pulse Generator Implant Date: 20240119
Pulse Gen Serial Number: 197105

## 2023-12-31 ENCOUNTER — Ambulatory Visit: Payer: Self-pay | Admitting: Cardiology

## 2024-02-23 NOTE — Progress Notes (Signed)
 Remote ICD Transmission

## 2024-03-29 ENCOUNTER — Ambulatory Visit: Payer: Self-pay

## 2024-03-29 DIAGNOSIS — I5022 Chronic systolic (congestive) heart failure: Secondary | ICD-10-CM

## 2024-03-30 LAB — CUP PACEART REMOTE DEVICE CHECK
Battery Remaining Percentage: 72 %
Date Time Interrogation Session: 20251103195000
HighPow Impedance: 130 Ohm
Implantable Lead Connection Status: 753985
Implantable Lead Implant Date: 20240119
Implantable Lead Location: 753862
Implantable Lead Model: 3501
Implantable Lead Serial Number: 248506
Implantable Pulse Generator Implant Date: 20240119
Pulse Gen Serial Number: 197105

## 2024-03-31 NOTE — Progress Notes (Signed)
 Remote ICD Transmission

## 2024-04-05 ENCOUNTER — Ambulatory Visit: Payer: Self-pay | Admitting: Cardiology

## 2024-06-28 ENCOUNTER — Ambulatory Visit: Payer: Self-pay

## 2024-06-29 LAB — CUP PACEART REMOTE DEVICE CHECK
Battery Remaining Percentage: 70 %
Date Time Interrogation Session: 20260202174400
HighPow Impedance: 130 Ohm
Implantable Lead Connection Status: 753985
Implantable Lead Implant Date: 20240119
Implantable Lead Location: 753862
Implantable Lead Model: 3501
Implantable Lead Serial Number: 248506
Implantable Pulse Generator Implant Date: 20240119
Pulse Gen Serial Number: 197105

## 2024-09-27 ENCOUNTER — Ambulatory Visit: Payer: Self-pay

## 2024-12-27 ENCOUNTER — Ambulatory Visit: Payer: Self-pay

## 2025-03-28 ENCOUNTER — Ambulatory Visit: Payer: Self-pay
